# Patient Record
Sex: Female | Born: 1940 | Race: White | Hispanic: No | Smoking: Never smoker
Health system: Southern US, Community
[De-identification: ages and names within clinical notes are randomized; demographics above are authoritative.]

## PROBLEM LIST (undated history)

## (undated) DIAGNOSIS — R197 Diarrhea, unspecified: Secondary | ICD-10-CM

## (undated) DIAGNOSIS — E785 Hyperlipidemia, unspecified: Secondary | ICD-10-CM

## (undated) DIAGNOSIS — B009 Herpesviral infection, unspecified: Secondary | ICD-10-CM

## (undated) DIAGNOSIS — F419 Anxiety disorder, unspecified: Secondary | ICD-10-CM

## (undated) DIAGNOSIS — F32A Depression, unspecified: Secondary | ICD-10-CM

## (undated) DIAGNOSIS — L600 Ingrowing nail: Secondary | ICD-10-CM

## (undated) DIAGNOSIS — B0052 Herpesviral keratitis: Secondary | ICD-10-CM

## (undated) DIAGNOSIS — A6009 Herpesviral infection of other urogenital tract: Secondary | ICD-10-CM

## (undated) DIAGNOSIS — T7840XA Allergy, unspecified, initial encounter: Secondary | ICD-10-CM

## (undated) DIAGNOSIS — F329 Major depressive disorder, single episode, unspecified: Secondary | ICD-10-CM

## (undated) DIAGNOSIS — F319 Bipolar disorder, unspecified: Secondary | ICD-10-CM

## (undated) DIAGNOSIS — T50902A Poisoning by unspecified drugs, medicaments and biological substances, intentional self-harm, initial encounter: Secondary | ICD-10-CM

## (undated) DIAGNOSIS — R630 Anorexia: Secondary | ICD-10-CM

## (undated) DIAGNOSIS — K589 Irritable bowel syndrome without diarrhea: Secondary | ICD-10-CM

## (undated) DIAGNOSIS — J349 Unspecified disorder of nose and nasal sinuses: Secondary | ICD-10-CM

## (undated) DIAGNOSIS — T424X1A Poisoning by benzodiazepines, accidental (unintentional), initial encounter: Secondary | ICD-10-CM

## (undated) HISTORY — DX: Major depressive disorder, single episode, unspecified: F32.9

## (undated) HISTORY — PX: CATARACT EXTRACTION: SUR2

## (undated) HISTORY — DX: Poisoning by benzodiazepines, accidental (unintentional), initial encounter: T42.4X1A

## (undated) HISTORY — DX: Allergy, unspecified, initial encounter: T78.40XA

## (undated) HISTORY — PX: OTHER SURGICAL HISTORY: SHX169

## (undated) HISTORY — PX: EYE SURGERY: SHX253

## (undated) HISTORY — DX: Depression, unspecified: F32.A

## (undated) HISTORY — DX: Herpesviral infection of other urogenital tract: A60.09

## (undated) HISTORY — DX: Hyperlipidemia, unspecified: E78.5

## (undated) HISTORY — PX: TUBAL LIGATION: SHX77

## (undated) HISTORY — DX: Bipolar disorder, unspecified: F31.9

---

## 1981-09-08 DIAGNOSIS — A6009 Herpesviral infection of other urogenital tract: Secondary | ICD-10-CM

## 1981-09-08 HISTORY — DX: Herpesviral infection of other urogenital tract: A60.09

## 1998-07-12 ENCOUNTER — Inpatient Hospital Stay (HOSPITAL_COMMUNITY): Admission: EM | Admit: 1998-07-12 | Discharge: 1998-07-16 | Payer: Self-pay | Admitting: Emergency Medicine

## 1999-05-02 ENCOUNTER — Other Ambulatory Visit: Admission: RE | Admit: 1999-05-02 | Discharge: 1999-05-02 | Payer: Self-pay | Admitting: Gynecology

## 2000-01-23 ENCOUNTER — Emergency Department (HOSPITAL_COMMUNITY): Admission: EM | Admit: 2000-01-23 | Discharge: 2000-01-23 | Payer: Self-pay | Admitting: Emergency Medicine

## 2000-01-23 ENCOUNTER — Encounter: Payer: Self-pay | Admitting: Emergency Medicine

## 2000-09-14 ENCOUNTER — Other Ambulatory Visit: Admission: RE | Admit: 2000-09-14 | Discharge: 2000-09-14 | Payer: Self-pay | Admitting: Obstetrics and Gynecology

## 2003-04-06 ENCOUNTER — Other Ambulatory Visit: Admission: RE | Admit: 2003-04-06 | Discharge: 2003-04-06 | Payer: Self-pay | Admitting: Obstetrics and Gynecology

## 2003-09-03 ENCOUNTER — Emergency Department (HOSPITAL_COMMUNITY): Admission: EM | Admit: 2003-09-03 | Discharge: 2003-09-03 | Payer: Self-pay | Admitting: Emergency Medicine

## 2003-11-29 ENCOUNTER — Emergency Department (HOSPITAL_COMMUNITY): Admission: AD | Admit: 2003-11-29 | Discharge: 2003-11-29 | Payer: Self-pay | Admitting: Family Medicine

## 2004-02-22 ENCOUNTER — Encounter: Admission: RE | Admit: 2004-02-22 | Discharge: 2004-02-22 | Payer: Self-pay | Admitting: Family Medicine

## 2004-03-13 ENCOUNTER — Encounter: Admission: RE | Admit: 2004-03-13 | Discharge: 2004-03-13 | Payer: Self-pay | Admitting: Sports Medicine

## 2004-04-04 ENCOUNTER — Emergency Department (HOSPITAL_COMMUNITY): Admission: EM | Admit: 2004-04-04 | Discharge: 2004-04-04 | Payer: Self-pay | Admitting: Family Medicine

## 2004-04-21 ENCOUNTER — Emergency Department (HOSPITAL_COMMUNITY): Admission: EM | Admit: 2004-04-21 | Discharge: 2004-04-21 | Payer: Self-pay | Admitting: Family Medicine

## 2004-05-16 ENCOUNTER — Ambulatory Visit: Payer: Self-pay | Admitting: Family Medicine

## 2004-06-11 ENCOUNTER — Ambulatory Visit: Payer: Self-pay | Admitting: Family Medicine

## 2004-08-01 ENCOUNTER — Emergency Department (HOSPITAL_COMMUNITY): Admission: EM | Admit: 2004-08-01 | Discharge: 2004-08-01 | Payer: Self-pay | Admitting: Family Medicine

## 2004-08-08 ENCOUNTER — Ambulatory Visit: Payer: Self-pay | Admitting: Family Medicine

## 2004-08-14 ENCOUNTER — Ambulatory Visit: Payer: Self-pay | Admitting: Family Medicine

## 2004-10-11 ENCOUNTER — Emergency Department (HOSPITAL_COMMUNITY): Admission: EM | Admit: 2004-10-11 | Discharge: 2004-10-11 | Payer: Self-pay | Admitting: Family Medicine

## 2004-11-07 ENCOUNTER — Ambulatory Visit: Payer: Self-pay | Admitting: Family Medicine

## 2004-12-13 ENCOUNTER — Ambulatory Visit: Payer: Self-pay | Admitting: Sports Medicine

## 2005-01-29 ENCOUNTER — Ambulatory Visit: Payer: Self-pay | Admitting: Family Medicine

## 2005-03-08 ENCOUNTER — Encounter (INDEPENDENT_AMBULATORY_CARE_PROVIDER_SITE_OTHER): Payer: Self-pay | Admitting: *Deleted

## 2005-03-08 LAB — CONVERTED CEMR LAB

## 2005-03-11 ENCOUNTER — Emergency Department (HOSPITAL_COMMUNITY): Admission: EM | Admit: 2005-03-11 | Discharge: 2005-03-11 | Payer: Self-pay | Admitting: Emergency Medicine

## 2005-03-21 ENCOUNTER — Encounter (INDEPENDENT_AMBULATORY_CARE_PROVIDER_SITE_OTHER): Payer: Self-pay | Admitting: *Deleted

## 2005-03-21 ENCOUNTER — Ambulatory Visit: Payer: Self-pay | Admitting: Family Medicine

## 2005-04-28 ENCOUNTER — Ambulatory Visit: Payer: Self-pay | Admitting: Family Medicine

## 2005-06-08 DIAGNOSIS — T424X1A Poisoning by benzodiazepines, accidental (unintentional), initial encounter: Secondary | ICD-10-CM

## 2005-06-08 HISTORY — DX: Poisoning by benzodiazepines, accidental (unintentional), initial encounter: T42.4X1A

## 2005-06-15 ENCOUNTER — Emergency Department (HOSPITAL_COMMUNITY): Admission: EM | Admit: 2005-06-15 | Discharge: 2005-06-15 | Payer: Self-pay | Admitting: Family Medicine

## 2005-06-30 ENCOUNTER — Ambulatory Visit: Payer: Self-pay | Admitting: Psychiatry

## 2005-06-30 ENCOUNTER — Inpatient Hospital Stay (HOSPITAL_COMMUNITY): Admission: EM | Admit: 2005-06-30 | Discharge: 2005-07-04 | Payer: Self-pay | Admitting: Psychiatry

## 2005-07-19 ENCOUNTER — Emergency Department (HOSPITAL_COMMUNITY): Admission: EM | Admit: 2005-07-19 | Discharge: 2005-07-19 | Payer: Self-pay | Admitting: Family Medicine

## 2005-08-05 ENCOUNTER — Ambulatory Visit (HOSPITAL_COMMUNITY): Payer: Self-pay | Admitting: Psychiatry

## 2005-08-23 ENCOUNTER — Emergency Department (HOSPITAL_COMMUNITY): Admission: EM | Admit: 2005-08-23 | Discharge: 2005-08-23 | Payer: Self-pay | Admitting: Family Medicine

## 2005-09-09 ENCOUNTER — Emergency Department (HOSPITAL_COMMUNITY): Admission: EM | Admit: 2005-09-09 | Discharge: 2005-09-09 | Payer: Self-pay | Admitting: Emergency Medicine

## 2005-11-04 ENCOUNTER — Ambulatory Visit: Payer: Self-pay | Admitting: Family Medicine

## 2005-11-08 ENCOUNTER — Emergency Department (HOSPITAL_COMMUNITY): Admission: AD | Admit: 2005-11-08 | Discharge: 2005-11-08 | Payer: Self-pay | Admitting: Family Medicine

## 2005-11-12 ENCOUNTER — Ambulatory Visit (HOSPITAL_COMMUNITY): Admission: RE | Admit: 2005-11-12 | Discharge: 2005-11-12 | Payer: Self-pay | Admitting: Family Medicine

## 2005-11-14 ENCOUNTER — Emergency Department (HOSPITAL_COMMUNITY): Admission: EM | Admit: 2005-11-14 | Discharge: 2005-11-14 | Payer: Self-pay | Admitting: Family Medicine

## 2005-11-24 ENCOUNTER — Ambulatory Visit (HOSPITAL_COMMUNITY): Admission: RE | Admit: 2005-11-24 | Discharge: 2005-11-24 | Payer: Self-pay | Admitting: Family Medicine

## 2005-11-28 ENCOUNTER — Ambulatory Visit (HOSPITAL_COMMUNITY): Payer: Self-pay | Admitting: Psychiatry

## 2005-12-02 ENCOUNTER — Ambulatory Visit (HOSPITAL_COMMUNITY): Admission: RE | Admit: 2005-12-02 | Discharge: 2005-12-02 | Payer: Self-pay | Admitting: Family Medicine

## 2005-12-04 ENCOUNTER — Ambulatory Visit: Payer: Self-pay | Admitting: Family Medicine

## 2005-12-08 ENCOUNTER — Encounter: Admission: RE | Admit: 2005-12-08 | Discharge: 2005-12-08 | Payer: Self-pay | Admitting: Family Medicine

## 2006-01-28 ENCOUNTER — Ambulatory Visit (HOSPITAL_COMMUNITY): Payer: Self-pay | Admitting: Psychiatry

## 2006-01-30 ENCOUNTER — Emergency Department (HOSPITAL_COMMUNITY): Admission: EM | Admit: 2006-01-30 | Discharge: 2006-01-30 | Payer: Self-pay | Admitting: Family Medicine

## 2006-03-26 ENCOUNTER — Emergency Department (HOSPITAL_COMMUNITY): Admission: EM | Admit: 2006-03-26 | Discharge: 2006-03-26 | Payer: Self-pay | Admitting: Emergency Medicine

## 2006-03-30 ENCOUNTER — Ambulatory Visit: Payer: Self-pay | Admitting: Family Medicine

## 2006-03-31 ENCOUNTER — Ambulatory Visit (HOSPITAL_COMMUNITY): Payer: Self-pay | Admitting: Psychiatry

## 2006-05-13 ENCOUNTER — Emergency Department (HOSPITAL_COMMUNITY): Admission: EM | Admit: 2006-05-13 | Discharge: 2006-05-13 | Payer: Self-pay | Admitting: Family Medicine

## 2006-07-16 ENCOUNTER — Ambulatory Visit (HOSPITAL_COMMUNITY): Payer: Self-pay | Admitting: Psychiatry

## 2006-08-13 ENCOUNTER — Emergency Department (HOSPITAL_COMMUNITY): Admission: EM | Admit: 2006-08-13 | Discharge: 2006-08-13 | Payer: Self-pay | Admitting: Family Medicine

## 2006-09-25 ENCOUNTER — Emergency Department (HOSPITAL_COMMUNITY): Admission: EM | Admit: 2006-09-25 | Discharge: 2006-09-25 | Payer: Self-pay | Admitting: Emergency Medicine

## 2006-10-13 ENCOUNTER — Ambulatory Visit (HOSPITAL_COMMUNITY): Payer: Self-pay | Admitting: Psychiatry

## 2006-11-05 DIAGNOSIS — F319 Bipolar disorder, unspecified: Secondary | ICD-10-CM | POA: Insufficient documentation

## 2006-11-05 DIAGNOSIS — E7800 Pure hypercholesterolemia, unspecified: Secondary | ICD-10-CM

## 2006-11-06 ENCOUNTER — Encounter (INDEPENDENT_AMBULATORY_CARE_PROVIDER_SITE_OTHER): Payer: Self-pay | Admitting: *Deleted

## 2006-11-12 ENCOUNTER — Emergency Department (HOSPITAL_COMMUNITY): Admission: EM | Admit: 2006-11-12 | Discharge: 2006-11-12 | Payer: Self-pay | Admitting: Emergency Medicine

## 2006-11-17 ENCOUNTER — Ambulatory Visit (HOSPITAL_COMMUNITY): Payer: Self-pay | Admitting: Psychiatry

## 2006-11-26 ENCOUNTER — Ambulatory Visit (HOSPITAL_COMMUNITY): Payer: Self-pay | Admitting: Psychiatry

## 2006-12-14 ENCOUNTER — Telehealth: Payer: Self-pay | Admitting: *Deleted

## 2006-12-15 ENCOUNTER — Encounter: Admission: RE | Admit: 2006-12-15 | Discharge: 2006-12-15 | Payer: Self-pay | Admitting: Sports Medicine

## 2006-12-15 ENCOUNTER — Ambulatory Visit: Payer: Self-pay | Admitting: Family Medicine

## 2006-12-22 ENCOUNTER — Telehealth: Payer: Self-pay | Admitting: *Deleted

## 2006-12-23 ENCOUNTER — Ambulatory Visit: Payer: Self-pay | Admitting: Family Medicine

## 2007-02-02 ENCOUNTER — Ambulatory Visit (HOSPITAL_COMMUNITY): Payer: Self-pay | Admitting: Psychiatry

## 2007-03-02 ENCOUNTER — Emergency Department (HOSPITAL_COMMUNITY): Admission: EM | Admit: 2007-03-02 | Discharge: 2007-03-02 | Payer: Self-pay | Admitting: Family Medicine

## 2007-03-18 ENCOUNTER — Encounter (INDEPENDENT_AMBULATORY_CARE_PROVIDER_SITE_OTHER): Payer: Self-pay | Admitting: Family Medicine

## 2007-04-26 ENCOUNTER — Encounter: Payer: Self-pay | Admitting: *Deleted

## 2007-04-26 LAB — CONVERTED CEMR LAB
Bilirubin Urine: NEGATIVE
Glucose, Urine, Semiquant: NEGATIVE
Ketones, urine, test strip: NEGATIVE
Nitrite: NEGATIVE
Protein, U semiquant: 30
Specific Gravity, Urine: 1.015
Urobilinogen, UA: 0.2
pH: 6.5

## 2007-04-27 ENCOUNTER — Encounter: Payer: Self-pay | Admitting: *Deleted

## 2007-05-11 ENCOUNTER — Telehealth (INDEPENDENT_AMBULATORY_CARE_PROVIDER_SITE_OTHER): Payer: Self-pay | Admitting: *Deleted

## 2007-05-11 ENCOUNTER — Ambulatory Visit: Payer: Self-pay | Admitting: Family Medicine

## 2007-05-12 ENCOUNTER — Ambulatory Visit (HOSPITAL_COMMUNITY): Payer: Self-pay | Admitting: Psychiatry

## 2007-05-19 ENCOUNTER — Ambulatory Visit: Payer: Self-pay | Admitting: Family Medicine

## 2007-08-12 ENCOUNTER — Telehealth: Payer: Self-pay | Admitting: *Deleted

## 2007-08-12 ENCOUNTER — Ambulatory Visit: Payer: Self-pay | Admitting: Family Medicine

## 2007-08-28 ENCOUNTER — Emergency Department (HOSPITAL_COMMUNITY): Admission: EM | Admit: 2007-08-28 | Discharge: 2007-08-28 | Payer: Self-pay | Admitting: Family Medicine

## 2007-08-30 ENCOUNTER — Telehealth: Payer: Self-pay | Admitting: *Deleted

## 2007-08-30 ENCOUNTER — Emergency Department (HOSPITAL_COMMUNITY): Admission: EM | Admit: 2007-08-30 | Discharge: 2007-08-30 | Payer: Self-pay | Admitting: *Deleted

## 2007-09-27 ENCOUNTER — Telehealth: Payer: Self-pay | Admitting: *Deleted

## 2007-10-09 ENCOUNTER — Emergency Department (HOSPITAL_COMMUNITY): Admission: EM | Admit: 2007-10-09 | Discharge: 2007-10-10 | Payer: Self-pay | Admitting: Emergency Medicine

## 2008-02-03 ENCOUNTER — Emergency Department (HOSPITAL_COMMUNITY): Admission: EM | Admit: 2008-02-03 | Discharge: 2008-02-03 | Payer: Self-pay | Admitting: Family Medicine

## 2008-03-08 ENCOUNTER — Encounter (INDEPENDENT_AMBULATORY_CARE_PROVIDER_SITE_OTHER): Payer: Self-pay | Admitting: Family Medicine

## 2008-04-17 ENCOUNTER — Telehealth: Payer: Self-pay | Admitting: *Deleted

## 2008-04-26 ENCOUNTER — Emergency Department (HOSPITAL_COMMUNITY): Admission: EM | Admit: 2008-04-26 | Discharge: 2008-04-26 | Payer: Self-pay | Admitting: Family Medicine

## 2008-05-16 ENCOUNTER — Telehealth: Payer: Self-pay | Admitting: *Deleted

## 2008-05-17 ENCOUNTER — Ambulatory Visit: Payer: Self-pay | Admitting: Family Medicine

## 2008-05-17 DIAGNOSIS — I10 Essential (primary) hypertension: Secondary | ICD-10-CM

## 2008-05-17 DIAGNOSIS — F329 Major depressive disorder, single episode, unspecified: Secondary | ICD-10-CM

## 2008-06-05 ENCOUNTER — Ambulatory Visit: Payer: Self-pay | Admitting: Family Medicine

## 2008-06-21 ENCOUNTER — Encounter (INDEPENDENT_AMBULATORY_CARE_PROVIDER_SITE_OTHER): Payer: Self-pay | Admitting: Family Medicine

## 2008-06-21 ENCOUNTER — Ambulatory Visit: Payer: Self-pay | Admitting: Family Medicine

## 2008-06-26 LAB — CONVERTED CEMR LAB
ALT: 18 units/L (ref 0–35)
AST: 17 units/L (ref 0–37)
Albumin: 4.6 g/dL (ref 3.5–5.2)
Alkaline Phosphatase: 69 units/L (ref 39–117)
BUN: 18 mg/dL (ref 6–23)
Basophils Absolute: 0.1 10*3/uL (ref 0.0–0.1)
Basophils Relative: 1 % (ref 0–1)
CO2: 25 meq/L (ref 19–32)
Calcium: 9.5 mg/dL (ref 8.4–10.5)
Chloride: 99 meq/L (ref 96–112)
Cholesterol: 245 mg/dL — ABNORMAL HIGH (ref 0–200)
Creatinine, Ser: 0.94 mg/dL (ref 0.40–1.20)
Eosinophils Absolute: 0.1 10*3/uL (ref 0.0–0.7)
Eosinophils Relative: 2 % (ref 0–5)
Glucose, Bld: 111 mg/dL — ABNORMAL HIGH (ref 70–99)
HCT: 47.7 % — ABNORMAL HIGH (ref 36.0–46.0)
HDL: 41 mg/dL (ref >39)
Hemoglobin: 15.9 g/dL — ABNORMAL HIGH (ref 12.0–15.0)
LDL Cholesterol: 144 mg/dL — ABNORMAL HIGH (ref 0–99)
Lymphocytes Relative: 40 % (ref 12–46)
Lymphs Abs: 2.8 10*3/uL (ref 0.7–4.0)
MCHC: 33.3 g/dL (ref 30.0–36.0)
MCV: 98.6 fL (ref 78.0–100.0)
Monocytes Absolute: 0.6 10*3/uL (ref 0.1–1.0)
Monocytes Relative: 8 % (ref 3–12)
Neutro Abs: 3.6 10*3/uL (ref 1.7–7.7)
Neutrophils Relative %: 50 % (ref 43–77)
Platelets: 365 10*3/uL (ref 150–400)
Potassium: 4.4 meq/L (ref 3.5–5.3)
RBC: 4.84 M/uL (ref 3.87–5.11)
RDW: 14.1 % (ref 11.5–15.5)
Sodium: 138 meq/L (ref 135–145)
TSH: 3.037 microintl units/mL (ref 0.350–4.50)
Total Bilirubin: 0.5 mg/dL (ref 0.3–1.2)
Total CHOL/HDL Ratio: 6
Total Protein: 7.5 g/dL (ref 6.0–8.3)
Triglycerides: 301 mg/dL — ABNORMAL HIGH (ref <150)
VLDL: 60 mg/dL — ABNORMAL HIGH (ref 0–40)
Vitamin B-12: 514 pg/mL (ref 211–911)
WBC: 7.1 10*3/uL (ref 4.0–10.5)

## 2008-07-07 ENCOUNTER — Encounter (INDEPENDENT_AMBULATORY_CARE_PROVIDER_SITE_OTHER): Payer: Self-pay | Admitting: Family Medicine

## 2008-07-07 ENCOUNTER — Ambulatory Visit: Payer: Self-pay | Admitting: Family Medicine

## 2008-08-26 ENCOUNTER — Emergency Department (HOSPITAL_COMMUNITY): Admission: EM | Admit: 2008-08-26 | Discharge: 2008-08-26 | Payer: Self-pay | Admitting: Emergency Medicine

## 2008-08-28 ENCOUNTER — Telehealth: Payer: Self-pay | Admitting: *Deleted

## 2008-09-03 ENCOUNTER — Emergency Department (HOSPITAL_COMMUNITY): Admission: EM | Admit: 2008-09-03 | Discharge: 2008-09-03 | Payer: Self-pay | Admitting: Family Medicine

## 2008-09-21 ENCOUNTER — Encounter: Payer: Self-pay | Admitting: Family Medicine

## 2008-09-21 ENCOUNTER — Telehealth: Payer: Self-pay | Admitting: *Deleted

## 2008-09-21 ENCOUNTER — Ambulatory Visit: Payer: Self-pay | Admitting: Family Medicine

## 2008-09-21 LAB — CONVERTED CEMR LAB
BUN: 18 mg/dL (ref 6–23)
CO2: 22 meq/L (ref 19–32)
Calcium: 9 mg/dL (ref 8.4–10.5)
Chloride: 105 meq/L (ref 96–112)
Creatinine, Ser: 0.77 mg/dL (ref 0.40–1.20)
Glucose, Bld: 88 mg/dL (ref 70–99)
Potassium: 4.5 meq/L (ref 3.5–5.3)
Sed Rate: 2 mm/hr (ref 0–22)
Sodium: 141 meq/L (ref 135–145)

## 2008-09-28 ENCOUNTER — Encounter: Payer: Self-pay | Admitting: Family Medicine

## 2008-10-16 ENCOUNTER — Telehealth (INDEPENDENT_AMBULATORY_CARE_PROVIDER_SITE_OTHER): Payer: Self-pay | Admitting: Family Medicine

## 2008-10-18 ENCOUNTER — Ambulatory Visit: Payer: Self-pay | Admitting: Family Medicine

## 2008-10-25 ENCOUNTER — Encounter (INDEPENDENT_AMBULATORY_CARE_PROVIDER_SITE_OTHER): Payer: Self-pay | Admitting: Family Medicine

## 2008-10-25 ENCOUNTER — Ambulatory Visit: Payer: Self-pay | Admitting: Family Medicine

## 2008-10-26 LAB — CONVERTED CEMR LAB
ALT: 30 units/L (ref 0–35)
AST: 23 units/L (ref 0–37)
Albumin: 4.2 g/dL (ref 3.5–5.2)
Alkaline Phosphatase: 66 units/L (ref 39–117)
BUN: 21 mg/dL (ref 6–23)
CO2: 25 meq/L (ref 19–32)
Calcium: 9 mg/dL (ref 8.4–10.5)
Chloride: 103 meq/L (ref 96–112)
Creatinine, Ser: 0.73 mg/dL (ref 0.40–1.20)
Direct LDL: 133 mg/dL — ABNORMAL HIGH
Glucose, Bld: 117 mg/dL — ABNORMAL HIGH (ref 70–99)
Potassium: 4.3 meq/L (ref 3.5–5.3)
Sodium: 139 meq/L (ref 135–145)
Total Bilirubin: 0.3 mg/dL (ref 0.3–1.2)
Total Protein: 6.9 g/dL (ref 6.0–8.3)

## 2008-11-25 ENCOUNTER — Emergency Department (HOSPITAL_COMMUNITY): Admission: EM | Admit: 2008-11-25 | Discharge: 2008-11-26 | Payer: Self-pay | Admitting: Emergency Medicine

## 2008-12-01 ENCOUNTER — Ambulatory Visit: Payer: Self-pay | Admitting: Family Medicine

## 2008-12-15 ENCOUNTER — Ambulatory Visit: Payer: Self-pay | Admitting: Family Medicine

## 2008-12-15 DIAGNOSIS — R7303 Prediabetes: Secondary | ICD-10-CM | POA: Insufficient documentation

## 2008-12-15 LAB — CONVERTED CEMR LAB: Hgb A1c MFr Bld: 6.2 %

## 2008-12-20 ENCOUNTER — Encounter (INDEPENDENT_AMBULATORY_CARE_PROVIDER_SITE_OTHER): Payer: Self-pay | Admitting: Family Medicine

## 2008-12-20 ENCOUNTER — Telehealth: Payer: Self-pay | Admitting: *Deleted

## 2009-01-19 ENCOUNTER — Emergency Department (HOSPITAL_COMMUNITY): Admission: EM | Admit: 2009-01-19 | Discharge: 2009-01-20 | Payer: Self-pay | Admitting: Emergency Medicine

## 2009-01-26 ENCOUNTER — Ambulatory Visit: Payer: Self-pay | Admitting: Family Medicine

## 2009-01-26 DIAGNOSIS — G4700 Insomnia, unspecified: Secondary | ICD-10-CM | POA: Insufficient documentation

## 2009-01-30 ENCOUNTER — Telehealth (INDEPENDENT_AMBULATORY_CARE_PROVIDER_SITE_OTHER): Payer: Self-pay | Admitting: Family Medicine

## 2009-01-31 ENCOUNTER — Ambulatory Visit (HOSPITAL_COMMUNITY): Admission: RE | Admit: 2009-01-31 | Discharge: 2009-01-31 | Payer: Self-pay | Admitting: Family Medicine

## 2009-01-31 ENCOUNTER — Ambulatory Visit: Payer: Self-pay | Admitting: Family Medicine

## 2009-02-02 ENCOUNTER — Encounter (INDEPENDENT_AMBULATORY_CARE_PROVIDER_SITE_OTHER): Payer: Self-pay | Admitting: Family Medicine

## 2009-02-19 ENCOUNTER — Encounter (INDEPENDENT_AMBULATORY_CARE_PROVIDER_SITE_OTHER): Payer: Self-pay | Admitting: Family Medicine

## 2009-06-06 ENCOUNTER — Ambulatory Visit: Payer: Self-pay | Admitting: Family Medicine

## 2009-06-06 ENCOUNTER — Encounter: Payer: Self-pay | Admitting: Family Medicine

## 2009-06-06 LAB — CONVERTED CEMR LAB: Hgb A1c MFr Bld: 6.3 %

## 2009-06-08 ENCOUNTER — Telehealth: Payer: Self-pay | Admitting: Family Medicine

## 2009-06-11 ENCOUNTER — Encounter: Payer: Self-pay | Admitting: Family Medicine

## 2009-06-25 ENCOUNTER — Telehealth: Payer: Self-pay | Admitting: Family Medicine

## 2009-06-25 ENCOUNTER — Ambulatory Visit: Payer: Self-pay | Admitting: Family Medicine

## 2009-07-09 ENCOUNTER — Ambulatory Visit: Payer: Self-pay | Admitting: Family Medicine

## 2009-07-26 ENCOUNTER — Encounter: Payer: Self-pay | Admitting: Family Medicine

## 2009-08-10 ENCOUNTER — Telehealth: Payer: Self-pay | Admitting: Family Medicine

## 2009-09-11 ENCOUNTER — Telehealth: Payer: Self-pay | Admitting: Family Medicine

## 2009-09-21 ENCOUNTER — Ambulatory Visit: Payer: Self-pay | Admitting: Family Medicine

## 2009-09-21 ENCOUNTER — Encounter: Payer: Self-pay | Admitting: Family Medicine

## 2009-09-21 LAB — CONVERTED CEMR LAB
ALT: 26 units/L (ref 0–35)
AST: 20 units/L (ref 0–37)
Albumin: 4.6 g/dL (ref 3.5–5.2)
Alkaline Phosphatase: 59 units/L (ref 39–117)
BUN: 22 mg/dL (ref 6–23)
Basophils Absolute: 0.1 10*3/uL (ref 0.0–0.1)
Basophils Relative: 1 % (ref 0–1)
CO2: 21 meq/L (ref 19–32)
CRP: 0.1 mg/dL (ref <0.6)
Calcium: 8.9 mg/dL (ref 8.4–10.5)
Chloride: 100 meq/L (ref 96–112)
Creatinine, Ser: 0.75 mg/dL (ref 0.40–1.20)
Eosinophils Absolute: 0.1 10*3/uL (ref 0.0–0.7)
Eosinophils Relative: 1 % (ref 0–5)
Glucose, Bld: 97 mg/dL (ref 70–99)
HCT: 45.1 % (ref 36.0–46.0)
Hemoglobin: 14.9 g/dL (ref 12.0–15.0)
Lymphocytes Relative: 28 % (ref 12–46)
Lymphs Abs: 2.6 10*3/uL (ref 0.7–4.0)
MCHC: 33 g/dL (ref 30.0–36.0)
MCV: 100.7 fL — ABNORMAL HIGH (ref 78.0–100.0)
Monocytes Absolute: 0.7 10*3/uL (ref 0.1–1.0)
Monocytes Relative: 7 % (ref 3–12)
Neutro Abs: 5.7 10*3/uL (ref 1.7–7.7)
Neutrophils Relative %: 63 % (ref 43–77)
Platelets: 372 10*3/uL (ref 150–400)
Potassium: 4.6 meq/L (ref 3.5–5.3)
RBC: 4.48 M/uL (ref 3.87–5.11)
RDW: 13 % (ref 11.5–15.5)
Sodium: 139 meq/L (ref 135–145)
Total Bilirubin: 0.4 mg/dL (ref 0.3–1.2)
Total Protein: 7.3 g/dL (ref 6.0–8.3)
WBC: 9.1 10*3/uL (ref 4.0–10.5)

## 2009-09-28 ENCOUNTER — Ambulatory Visit: Payer: Self-pay | Admitting: Family Medicine

## 2009-10-29 ENCOUNTER — Telehealth: Payer: Self-pay | Admitting: Family Medicine

## 2009-10-29 ENCOUNTER — Ambulatory Visit: Payer: Self-pay | Admitting: Family Medicine

## 2010-01-10 ENCOUNTER — Ambulatory Visit: Payer: Self-pay | Admitting: Family Medicine

## 2010-01-10 ENCOUNTER — Encounter: Payer: Self-pay | Admitting: Family Medicine

## 2010-01-20 ENCOUNTER — Emergency Department (HOSPITAL_COMMUNITY): Admission: EM | Admit: 2010-01-20 | Discharge: 2010-01-20 | Payer: Self-pay | Admitting: Emergency Medicine

## 2010-01-31 ENCOUNTER — Ambulatory Visit: Payer: Self-pay | Admitting: Family Medicine

## 2010-01-31 ENCOUNTER — Telehealth: Payer: Self-pay | Admitting: Family Medicine

## 2010-02-13 ENCOUNTER — Encounter: Payer: Self-pay | Admitting: Family Medicine

## 2010-02-13 ENCOUNTER — Telehealth: Payer: Self-pay | Admitting: Family Medicine

## 2010-02-14 ENCOUNTER — Ambulatory Visit: Payer: Self-pay | Admitting: Family Medicine

## 2010-05-31 ENCOUNTER — Ambulatory Visit: Payer: Self-pay | Admitting: Family Medicine

## 2010-07-05 ENCOUNTER — Emergency Department (HOSPITAL_COMMUNITY): Admission: EM | Admit: 2010-07-05 | Discharge: 2010-07-05 | Payer: Self-pay | Admitting: Family Medicine

## 2010-08-05 ENCOUNTER — Telehealth (INDEPENDENT_AMBULATORY_CARE_PROVIDER_SITE_OTHER): Payer: Self-pay | Admitting: *Deleted

## 2010-08-05 ENCOUNTER — Ambulatory Visit: Payer: Self-pay | Admitting: Family Medicine

## 2010-08-05 DIAGNOSIS — A6000 Herpesviral infection of urogenital system, unspecified: Secondary | ICD-10-CM | POA: Insufficient documentation

## 2010-09-13 ENCOUNTER — Ambulatory Visit: Admit: 2010-09-13 | Payer: Self-pay

## 2010-09-29 ENCOUNTER — Encounter: Payer: Self-pay | Admitting: Family Medicine

## 2010-10-10 NOTE — Assessment & Plan Note (Signed)
Summary: ear pain & head cold per pt/[place]/Ta   Vital Signs:  Patient profile:   70 year old female Height:      61 inches Weight:      167 pounds BMI:     31.67 BSA:     1.75 Temp:     98.4 degrees F Pulse rate:   90 / minute BP sitting:   157 / 81  Vitals Entered By: Jone Baseman CMA (October 29, 2009 11:01 AM) CC: bilateral ear pain and head cold Is Patient Diabetic? Yes Did you bring your meter with you today? No Pain Assessment Patient in pain? yes     Location: throat/head Intensity: 7   Primary Care Provider:  Cat Ta MD  CC:  bilateral ear pain and head cold.  History of Present Illness: 70 yo female with 8 days of pharyngitis, headaehce, runny nose and eyes, bialteral otalgia that started on the LEFT.  No fever, dypsnea.  Not taking any medicine for relief.  Habits & Providers  Alcohol-Tobacco-Diet     Tobacco Status: never  Allergies (verified): 1)  ! Benadryl (Diphenhydramine Hcl)  Physical Exam  Additional Exam:  VITALS:  Reviewed, afebrile, hypertensive GEN: Alert & oriented, no acute distress NECK: Midline trachea, no masses/thyromegaly, anterior nontender bilateral LAD CARDIO: Regular rate and rhythm, no murmurs/rubs/gallops, 2+ bilateral radial pulses RESP: Clear to auscultation, normal work of breathing, no retractions/accessory muscle use EYES:  No corneal or conjunctival inflammation noted. EOMI. PERRLA.  Vision grossly normal. EARS:  LEFT TM red and retracted.  RIGHT wnl. NOSE:  Nasal mucosa are pink and moist without lesions or exudates. MOUTH:  Oral mucosa and oropharynx without lesions or exudates.    Impression & Recommendations:  Problem # 1:  VIRAL URI (ICD-465.9) Assessment New  Supportive care.  Orders: [facility]- Est Level  3 ([address])  Problem # 2:  OTITIS MEDIA, ACUTE, LEFT (ICD-382.9) Assessment: New  This is secondary to #1, above.  Given pharyngitis symptoms will treat with longer 10-day course of Amoxicillin to cover GAS  pharyngitis as well, though low clinical suspicion given afebrile, no tender LAD.  Orders: [facility]- Est Level  3 ([address])  Complete Medication List: 1)  Hydrochlorothiazide 25 Mg Tabs (Hydrochlorothiazide) .... Take 1 tab by mouth every morning 2)  Acyclovir 800 Mg Tabs (Acyclovir) .Marland Kitchen.. 1 by mouth three times a day 3)  Lisinopril 10 Mg Tabs (Lisinopril) .Marland Kitchen.. 1 by mouth daily 4)  Alprazolam 0.5 Mg Tabs (Alprazolam) .Marland Kitchen.. 1-2 by mouth every 8 hours as needed for anxiety 5)  Perphenazine 4 Mg Tabs (Perphenazine) .Marland Kitchen.. 1 by mouth at bedtime 6)  Temazepam 30 Mg Caps (Temazepam) .Marland Kitchen.. 1-2 tab by mouth at bedtime 7)  Effexor Xr 75 Mg Xr24h-cap (Venlafaxine hcl) .... 3 tab by mouth daily. prescribed by [facility] 8)  Depakote 500 Mg Tbec (Divalproex sodium) .Marland Kitchen.. 1 tab by mouth daily per [facility] 9)  Doxycycline Hyclate 50 Mg Caps (Doxycycline hyclate) .Marland Kitchen.. 1 tab by mouth two times a day per dr hall jr 10)  Tramadol Hcl 50 Mg Tabs (Tramadol hcl) .... Take 1-2 pills by mouth every 6 hours as needed for pain 11)  Amoxicillin 500 Mg Tabs (Amoxicillin) .Marland Kitchen.. 1 tab by mouth three times a day x10 days  Patient Instructions: 1)  Pleasure to meet you today. 2)  I have sent prescription for Amoxicillin to [facility]. 3)  Please schedule a follow-up appointment if not better in 1 week, or if you develop fever or  shortness of breath. Prescriptions: AMOXICILLIN 500 MG TABS (AMOXICILLIN) 1 tab by mouth three times a day x10 days  #30 x 0   Entered and Authorized by:   Romero Belling MD   Signed by:   Romero Belling MD on 10/29/2009   Method used:   Electronically to        [facility] [address] [id number]* (retail)       [address] [place], [place]  [address]       Ph: [phone]       Fax: [phone]   RxID:   [id number]

## 2010-10-10 NOTE — Assessment & Plan Note (Signed)
Summary: isomnia, htn ,tcb   Vital Signs:  Patient profile:   70 year old female Height:      61 inches Weight:      167.8 pounds BMI:     31.82 Pulse rate:   81 / minute BP sitting:   194 / 106  (left arm) Cuff size:   regular  Vitals Entered By: Arlyss Repress CMA, (May 31, 2010 2:52 PM)  Serial Vital Signs/Assessments:  Time      Position  BP       Pulse  Resp  Temp     By 3:54 PM             158/98                         Arlyss Repress CMA,  Comments: 3:54 PM re-checked BP, after pt was more relaxed. By: Arlyss Repress CMA,   CC: extreme insomnia x 5 weeks. refill meds. out of BP meds x 1 month. pt states 'I am having a burning sensation all over my body from fatigue' Is Patient Diabetic? No Pain Assessment Patient in pain? no        Primary Care Provider:  Hilarie Sinha MD  CC:  extreme insomnia x 5 weeks. refill meds. out of BP meds x 1 month. pt states 'I am having a burning sensation all over my body from fatigue'.  History of Present Illness: 70 y/o F here for insomnia  Insomnia x 5 wks:  insomnia all her life.  worsened 5 wks ago.  takes xanax, depokote, resteril.  Being in out Xanax for a long time.  depakote prescribed by [facility].  Difficulty falling asleep.   Has difficulty falling asleep.  1 month ago her next door neighbor was murdered.  Pt saw the blood  in the house when people were cleaning the house out.  Ever since then she cannot sleep.  She thinks about this a lot.  She has not discussed this with [facility], because she thinks that they are "hateful".  She sees a CNP at [facility] for bipolar meds, but states that they do not give her any counseling.   Thoughts of SI, but only fleeting, for seconds.  She has has these thoughts for most of her life.  History of father commiting suicide.    HYPERTENSION Disease Monitoring Blood pressure range: 150s Medications: lisinopril 10, hctz 25 Compliance: no, out of HCTZ, then stopped taking lisinopril on  her own Lightheadedness: no  Edema:no   Chest pain:no   Dyspnea:no      Habits & Providers  Alcohol-Tobacco-Diet     Tobacco Status: never  Current Medications (verified): 1)  Hydrochlorothiazide 25 Mg  Tabs (Hydrochlorothiazide) .... Take 1 Tab By Mouth Every Morning 2)  Acyclovir 800 Mg  Tabs (Acyclovir) .Marland Kitchen.. 1 By Mouth Three Times A Day 3)  Lisinopril 10 Mg Tabs (Lisinopril) .Marland Kitchen.. 1 By Mouth Daily 4)  Alprazolam 0.5 Mg Tabs (Alprazolam) .Marland Kitchen.. 1 Tab By Mouth At Bedtime As Needed Insomnia/anxiety 5)  Perphenazine 4 Mg Tabs (Perphenazine) .Marland Kitchen.. 1 By Mouth At Bedtime 6)  Temazepam 30 Mg Caps (Temazepam) .Marland Kitchen.. 1-2 Tab By Mouth At Bedtime 7)  Effexor Xr 75 Mg Xr24h-Cap (Venlafaxine Hcl) .Marland Kitchen.. 1 Tab By Mouth Daily For 4 Days, Then 2 Tabs For 4 Days, Then 3 Tabs Daily 8)  Depakote 500 Mg Tbec (Divalproex Sodium) .Marland Kitchen.. 1 Tab By Mouth Daily Per [facility] 9)  Doxycycline Hyclate 50 Mg Caps (Doxycycline Hyclate) .Marland Kitchen.. 1 Tab By Mouth Two Times A Day Per Dr Cassie Freer  Allergies (verified): 1)  ! Benadryl (Diphenhydramine Hcl)  Past History:  Past Medical History: Last updated: 02/19/2009 Xanax OD in 10/06 Current Problems:  DIZZINESS (ICD-780.4) INSOMNIA (ICD-780.52) DIABETES MELLITUS, TYPE II (ICD-250.00) CATARACTS (ICD-366.9) GENITAL HERPES, HX OF (ICD-V13.8) ESSENTIAL HYPERTENSION, BENIGN (ICD-401.1) ABDOMINAL PAIN, CHRONIC (ICD-789.00) DEPRESSION, MAJOR (ICD-296.20) RHINITIS, ALLERGIC (ICD-477.9) HYPERCHOLESTEROLEMIA (ICD-272.0) BIPOLAR DISORDER (ICD-296.7)  Past Surgical History: Last updated: 07/09/2009 Cataract surgery: L eye 12/2008, R eye 05/2009 by Dr Hazle Quant eye surgery as a child Tubal ligation - 09/08/1969  Family History: Last updated: 05/17/2008 parents both with psychiatric illnesses.  Mother had breast cancer.  Social History: Last updated: 02/19/2009 Occassional EtOH.  Never smoked.  Denies drugs.  OD's on xanax in 2006.  Has 2 cats.  Is an Tree surgeon.  Meredeth Ide and stretches  for exercises.  Single - divorced 12 years ago. severe psychiatric impairments  Risk Factors: Alcohol Use: 0 (07/09/2009)  Risk Factors: Smoking Status: never (05/31/2010)  Review of Systems       per [facility]:  Complains of anxiety, depression, easily angered, easily tearful, irritability, and suicidal thoughts/plans; denies sense of great danger, thoughts of violence, and thoughts /plans of harming others.  Physical Exam  General:  Well-developed,well-nourished,in no acute distress; alert,appropriate and cooperative throughout examination. vitals reviewed.  Lungs:  Normal respiratory effort, chest expands symmetrically. Lungs are clear to auscultation, no crackles or wheezes. Heart:  Normal rate and regular rhythm. S1 and S2 normal without gallop, murmur, click, rub or other extra sounds. Psych:  Oriented X3, not suicidal, not homicidal, depressed affect, tearful, and moderately anxious.     Impression & Recommendations:  Problem # 1:  INSOMNIA (ICD-780.52) Assessment Deteriorated Long standing insomnia but deterioration in past weeks likely from acute anxiety in a pt who already has bipolar do, depression, anxiety.  Will refill Xanax for at bedtime use.  Pt to make appt with psych (we provided list of provider).  She is to f/u with me in 3-4 wks.   Orders: [facility]- Est Level  3 ([address])  Problem # 2:  ESSENTIAL HYPERTENSION, BENIGN (ICD-401.1) Assessment: Deteriorated Out of meds x wks.  Will resume.  Pt ot f/u in 3-4 wks Her updated medication list for this problem includes:    Hydrochlorothiazide 25 Mg Tabs (Hydrochlorothiazide) .Marland Kitchen... Take 1 tab by mouth every morning    Lisinopril 10 Mg Tabs (Lisinopril) .Marland Kitchen... 1 by mouth daily  Orders: [facility]- Est Level  3 ([id number])  Complete Medication List: 1)  Hydrochlorothiazide 25 Mg Tabs (Hydrochlorothiazide) .... Take 1 tab by mouth every morning 2)  Acyclovir 800 Mg Tabs (Acyclovir) .Marland Kitchen.. 1 by mouth three times a day 3)  Lisinopril 10  Mg Tabs (Lisinopril) .Marland Kitchen.. 1 by mouth daily 4)  Alprazolam 0.5 Mg Tabs (Alprazolam) .Marland Kitchen.. 1 tab by mouth at bedtime as needed insomnia/anxiety 5)  Perphenazine 4 Mg Tabs (Perphenazine) .Marland Kitchen.. 1 by mouth at bedtime 6)  Temazepam 30 Mg Caps (Temazepam) .Marland Kitchen.. 1-2 tab by mouth at bedtime 7)  Effexor Xr 75 Mg Xr24h-cap (Venlafaxine hcl) .Marland Kitchen.. 1 tab by mouth daily for 4 days, then 2 tabs for 4 days, then 3 tabs daily 8)  Depakote 500 Mg Tbec (Divalproex sodium) .Marland Kitchen.. 1 tab by mouth daily per [facility] 9)  Doxycycline Hyclate 50 Mg Caps (Doxycycline hyclate) .Marland Kitchen.. 1 tab by mouth two times a day per dr hall jr  Patient  Instructions: 1)  Please schedule a follow-up appointment in 3-4weeks for insomnia. 2)  You can take Xanax 0.5mg  before bed as needed for anxiety. 3)  Please make an appointment to see a psychiatrist at [facility] or another facility as soon as possible.  I think you would benefit from counseling to help you with this acute event.   Prescriptions: ALPRAZOLAM 0.5 MG TABS (ALPRAZOLAM) 1 tab by mouth at bedtime as needed insomnia/anxiety  #30 x 0   Entered and Authorized by:   Angeline Slim MD   Signed by:   Angeline Slim MD on 06/02/2010   Method used:   Telephoned to ...       [facility] [address] [id number]* (retail)       [address] [place], [place]  [address]       Ph: [phone]       Fax: [phone]   RxID:   [id number] ALPRAZOLAM 0.5 MG TABS (ALPRAZOLAM) 1-2 by mouth every 8 hours as needed for anxiety  #30 x 0   Entered and Authorized by:   Angeline Slim MD   Signed by:   Angeline Slim MD on 05/31/2010   Method used:   Telephoned to ...       [facility] [address] #[id number]* (retail)       [address] [place], [place]  [address]       Ph: [phone]       Fax: [phone]   RxID:   [id number] LISINOPRIL 10 MG TABS (LISINOPRIL) 1 by mouth daily  #34 x 5   Entered and Authorized by:   Angeline Slim MD   Signed by:   Angeline Slim MD on 05/31/2010   Method used:   Electronically to         [facility] [id number]* (retail)       [address] [place], [place]  [address]       Ph: [phone]       Fax: [phone]   RxID:   [id number] HYDROCHLOROTHIAZIDE 25 MG  TABS (HYDROCHLOROTHIAZIDE) Take 1 tab by mouth every morning  #34 x 5   Entered and Authorized by:   Angeline Slim MD   Signed by:   Angeline Slim MD on 05/31/2010   Method used:   Electronically to        [facility] #[id number]* (retail)       [address] [place], [place]  [address]       Ph: [phone]       Fax: [phone]   RxID:   [id number]

## 2010-10-10 NOTE — Miscellaneous (Signed)
Summary: Updated PT order  Clinical Lists Changes  Problems: Added new problem of BACK PAIN, LUMBAR (ICD-724.2) Orders: Added new Referral order of Physical Therapy Referral (PT) - Signed    Physical Therapy called and patient is coming for her first evaluation today from referral that was sent in on 01/10/2010. That referral is out of date and they need a new one.  new referral entered and  faxed. Theresia Lo RN  February 13, 2010 11:15 AM

## 2010-10-10 NOTE — Assessment & Plan Note (Signed)
Summary: needs meds,df   Vital Signs:  Patient profile:   70 year old female Weight:      172.9 pounds BMI:     32.79 Temp:     98.6 degrees F Pulse rate:   76 / minute BP sitting:   150 / 89  Vitals Entered By: Starleen Blue RN (Jan 31, 2010 9:14 AM)  Serial Vital Signs/Assessments:  Time      Position  BP       Pulse  Resp  Temp     By                     158/92                         Ardeen Garland  MD  CC: meds Is Patient Diabetic? Yes Pain Assessment Patient in pain? no        Primary Care Provider:  Cat Ta MD  CC:  meds.  History of Present Illness: Janice Ramos comes in today for BP meds and to discuss depression meds. 1) BP - on HCTZ 25 and lisinopril 10.  Ran out of lisinopril 2 nights ago and forgot to take her HCTZ today.  Tolerates them well.  CHecks her BP often when on them and states top number never higher than "128".  Last BMET normal in 1/11. No CP, HA, SOB, dizziness, orthostasis 2) Depression meds/care - has several issues outlined below:       - Used to see Dr. Geoffery Lyons.  Apparently stopped seeing private pts some time ago and so she started care with [facility].  Lately very unhappy with her care from [facility]. Dr. Dub Mikes now seeing patients again and she wants to get back with him but doesn't have his number.  Not in phone book.  Doesn't have or use the internet.        - Effexor XR - states when under Dr. Runell Gess care she was on 300 mg of effexor and did "great!".  [facility] she has been seeing an NP named [employer].  Would only give her up to 225 mg.  This was not helping enough so she switched her to citalopram.  Rubin Payor did not like citalopram at all.  SHe has been tryign to call and talk about this "with anyone" at [facility] but no one is returning her call, eventhough the receptionists has told her someone will.  Last tried to call FRiday (almost 1 week ago).  She stopped citalopram 2.5 weeks ago.  Still taking depakote and perphenazine.  No SI.   Just nervous, irritable, unhappy.  WAnts to go back on effexor and wants to get up to 300 mg dose.       - Temazepam - uses to sleep.  Helps a lot.  Has been on for awhile.  STates dose is usually 30mg  1-2 tabs qHS (and our records confirm this) but her last rx was written as only 1 tab qHS and so she ran out early and the pharmacy won't refill, and as stated earlier, she can't get ahold of anyone at [facility].  Brought bottle with her.  Was filled 01/06/10 and has 2 tabs left.  Was a quantity of 30.  Can't sleep without it and often needs 2 tabs.    Habits & Providers  Alcohol-Tobacco-Diet     Tobacco Status: never  Current Medications (verified): 1)  Hydrochlorothiazide 25 Mg  Tabs (Hydrochlorothiazide) .... Take 1 Tab By Mouth Every Morning 2)  Acyclovir 800 Mg  Tabs (Acyclovir) .Marland Kitchen.. 1 By Mouth Three Times A Day 3)  Lisinopril 10 Mg Tabs (Lisinopril) .Marland Kitchen.. 1 By Mouth Daily 4)  Alprazolam 0.5 Mg Tabs (Alprazolam) .Marland Kitchen.. 1-2 By Mouth Every 8 Hours As Needed For Anxiety 5)  Perphenazine 4 Mg Tabs (Perphenazine) .Marland Kitchen.. 1 By Mouth At Bedtime 6)  Temazepam 30 Mg Caps (Temazepam) .Marland Kitchen.. 1-2 Tab By Mouth At Bedtime 7)  Effexor Xr 75 Mg Xr24h-Cap (Venlafaxine Hcl) .Marland Kitchen.. 1 Tab By Mouth Daily For 4 Days, Then 2 Tabs For 4 Days, Then 3 Tabs Daily 8)  Depakote 500 Mg Tbec (Divalproex Sodium) .Marland Kitchen.. 1 Tab By Mouth Daily Per [facility] 9)  Doxycycline Hyclate 50 Mg Caps (Doxycycline Hyclate) .Marland Kitchen.. 1 Tab By Mouth Two Times A Day Per Dr Cassie Freer 10)  Tramadol Hcl 50 Mg Tabs (Tramadol Hcl) .... Take 1-2 Pills By Mouth Every 6 Hours As Needed For Pain 11)  Vicodin 5-500 Mg Tabs (Hydrocodone-Acetaminophen) .Marland Kitchen.. 1-2 Tabs By Mouth Q 6 Hrs As Needed Pain 12)  Flexeril 5 Mg Tabs (Cyclobenzaprine Hcl) .Marland Kitchen.. 1 Tab By Mouth Q 6 Hrs As Needed Pain or Spasm  Allergies: 1)  ! Benadryl (Diphenhydramine Hcl)  Physical Exam  General:  thin, alert, NAD but slightly anxious/agitated vitals reviewed and rechecked..  Lungs:  Normal  respiratory effort, chest expands symmetrically. Lungs are clear to auscultation, no crackles or wheezes. Heart:  Normal rate and regular rhythm. S1 and S2 normal without gallop, murmur, click, rub or other extra sounds. Pulses:  2+ radial and dp Extremities:  no edema Psych:  Oriented X3, memory intact for recent and remote, normally interactive, good eye contact, and moderately anxious.     Impression & Recommendations:  Problem # 1:  DEPRESSION, MAJOR (ICD-296.20) Assessment Deteriorated States bipolar was a "misdiagnosis".  Does appear to have anxiety associated with her depression.  Gives history of good response to effexor and good toleration.  Max dosage is 225 a day.  Discussed this and that I would prefer her to try the 225 mg dose again, try to get in with Dr. Dub Mikes before increasing to 300, if he feels that is appropriate.  If takes too long to get in to see him, we can possibly try 300 dose.  She will follow-up with Dr. Janalyn Harder in 2-4 weeks.  She was given Dr. Runell Gess contact information.  See patient instructions.   Problem # 2:  INSOMNIA (ICD-780.52) Assessment: Deteriorated Since our records confirmed the 1-2 tabs dose, did refill for her one time only.  Her updated medication list for this problem includes:    Temazepam 30 Mg Caps (Temazepam) .Marland Kitchen... 1-2 tab by mouth at bedtime  Problem # 3:  ESSENTIAL HYPERTENSION, BENIGN (ICD-401.1) Assessment: Deteriorated Restart meds and follow-up with PCP in 2-4 weeks.  Consider checking BMET to check Cr, K after re-initiation, though labwork was normal in January and patient has been on med long-term.  Her updated medication list for this problem includes:    Hydrochlorothiazide 25 Mg Tabs (Hydrochlorothiazide) .Marland Kitchen... Take 1 tab by mouth every morning    Lisinopril 10 Mg Tabs (Lisinopril) .Marland Kitchen... 1 by mouth daily  Complete Medication List: 1)  Hydrochlorothiazide 25 Mg Tabs (Hydrochlorothiazide) .... Take 1 tab by mouth every morning 2)   Acyclovir 800 Mg Tabs (Acyclovir) .Marland Kitchen.. 1 by mouth three times a day 3)  Lisinopril 10 Mg Tabs (Lisinopril) .Marland Kitchen.. 1 by mouth  daily 4)  Alprazolam 0.5 Mg Tabs (Alprazolam) .Marland Kitchen.. 1-2 by mouth every 8 hours as needed for anxiety 5)  Perphenazine 4 Mg Tabs (Perphenazine) .Marland Kitchen.. 1 by mouth at bedtime 6)  Temazepam 30 Mg Caps (Temazepam) .Marland Kitchen.. 1-2 tab by mouth at bedtime 7)  Effexor Xr 75 Mg Xr24h-cap (Venlafaxine hcl) .Marland Kitchen.. 1 tab by mouth daily for 4 days, then 2 tabs for 4 days, then 3 tabs daily 8)  Depakote 500 Mg Tbec (Divalproex sodium) .Marland Kitchen.. 1 tab by mouth daily per [facility] 9)  Doxycycline Hyclate 50 Mg Caps (Doxycycline hyclate) .Marland Kitchen.. 1 tab by mouth two times a day per dr hall jr 10)  Tramadol Hcl 50 Mg Tabs (Tramadol hcl) .... Take 1-2 pills by mouth every 6 hours as needed for pain 11)  Vicodin 5-500 Mg Tabs (Hydrocodone-acetaminophen) .Marland Kitchen.. 1-2 tabs by mouth q 6 hrs as needed pain 12)  Flexeril 5 Mg Tabs (Cyclobenzaprine hcl) .Marland Kitchen.. 1 tab by mouth q 6 hrs as needed pain or spasm  Patient Instructions: 1)  It was nice to see you again today.  I am sorry you are having trouble with [facility]. 2)  Dr. Madie Reno Lugo's office number is [phone].  He is part of [facility].  His office is at [address]  Please call him to try to establish with him as soon as you can. 3)  I have restarted your effexor.  The maximum dose is supposed to be 225mg  per day.  As I am not a psychiatrist, I am not comfortable prescribing doses greater than the maximum allowed.  Please stay at 225 until you are able to see Dr. Dub Mikes.  If you are unable or it is a very long time off, let us know. 4)  Since you have been off antidepressants all together for 2.5 weeks and off effexor itself even longer, we need to titrate you up.  Please take 1 tab a day for 4 days, then you can take 2 tabs a day for 4 days, then you can take 3 tabs a day for 4 days.  Remain there until seen by Dr. Dub Mikes.   5)  Please  restart your BP pills today. 6)  Please make an appt with Dr. Janalyn Harder to follow-up your BP and discuss how you are doing on the effexor XR.  Prescriptions: TEMAZEPAM 30 MG CAPS (TEMAZEPAM) 1-2 tab by mouth at bedtime  #68 x 0   Entered by:   Ardeen Garland  MD   Authorized by:   Zachery Dauer MD   Signed by:   Ardeen Garland  MD on 01/31/2010   Method used:   Print then Give to Patient   RxID:   [id number] LISINOPRIL 10 MG TABS (LISINOPRIL) 1 by mouth daily  #34 x 5   Entered by:   Ardeen Garland  MD   Authorized by:   Zachery Dauer MD   Signed by:   Ardeen Garland  MD on 01/31/2010   Method used:   Print then Give to Patient   RxID:   [id number] HYDROCHLOROTHIAZIDE 25 MG  TABS (HYDROCHLOROTHIAZIDE) Take 1 tab by mouth every morning  #34 x 5   Entered by:   Ardeen Garland  MD   Authorized by:   Zachery Dauer MD   Signed by:   Ardeen Garland  MD on 01/31/2010   Method used:   Print then Give to Patient   RxID:   [id number] EFFEXOR XR 75 MG  XR24H-CAP (VENLAFAXINE HCL) 1 tab by mouth daily for 4 days, then 2 tabs for 4 days, then 3 tabs daily  #90 x 2   Entered by:   Ardeen Garland  MD   Authorized by:   Zachery Dauer MD   Signed by:   Ardeen Garland  MD on 01/31/2010   Method used:   Print then Give to Patient   RxID:   [id number]   Prevention & Chronic Care Immunizations   Influenza vaccine: Not documented   Influenza vaccine deferral: Refused  (07/09/2009)    Tetanus booster: 02/07/2004: Done.   Tetanus booster due: 02/06/2014    Pneumococcal vaccine: Not documented    H. zoster vaccine: Not documented  Colorectal Screening   Hemoccult: Not documented    Colonoscopy: Not documented  Other Screening   Pap smear: Done.  (03/08/2005)   Pap smear due: Not Indicated    Mammogram: normal  (04/24/2008)   Mammogram due: 08/2010    DXA bone density scan: Not documented   Smoking status: never  (01/31/2010)  Diabetes Mellitus   HgbA1C: 6.3  (06/06/2009)   Hemoglobin A1C due:  03/16/2009    Eye exam: Cataract surgery  (12/07/2008)   Diabetic eye exam action/deferral: Not indicated  (07/09/2009)    Foot exam: yes  (07/09/2009)   Foot exam action/deferral: Do today   High risk foot: No  (07/09/2009)   Foot care education: Done  (07/09/2009)   Foot exam due: 07/09/2010    Urine microalbumin/creatinine ratio: Not documented  Lipids   Total Cholesterol: 245  (06/21/2008)   Lipid panel action/deferral: Lipid Panel ordered   LDL: 144  (06/21/2008)   LDL Direct: 133  (10/25/2008)   HDL: 41  (06/21/2008)   Triglycerides: 301  (06/21/2008)    SGOT (AST): 20  (09/21/2009)   SGPT (ALT): 26  (09/21/2009)   Alkaline phosphatase: 59  (09/21/2009)   Total bilirubin: 0.4  (09/21/2009)  Hypertension   Last Blood Pressure: 150 / 89  (01/31/2010)   Serum creatinine: 0.75  (09/21/2009)   Serum potassium 4.6  (09/21/2009)    Hypertension flowsheet reviewed?: Yes   Progress toward BP goal: Deteriorated  Self-Management Support :   Personal Goals (by the next clinic visit) :     Personal A1C goal: 6  (07/09/2009)     Personal blood pressure goal: 130/80  (07/09/2009)     Personal LDL goal: 100  (07/09/2009)    Diabetes self-management support: Not documented    Hypertension self-management support: Not documented    Lipid self-management support: Not documented     Appended Document: Orders Update signed ov before inputting provider charge.    Clinical Lists Changes  Orders: Added new Test order of [facility]- Est  Level 4 (32440) - Signed

## 2010-10-10 NOTE — Progress Notes (Signed)
Summary: triage  Phone Note Call from Patient Call back at Home Phone [phone]   Caller: Patient Summary of Call: pain in right hip - wants to come in asap Initial call taken by: De Nurse,  September 11, 2009 11:20 AM  Follow-up for Phone Call        denies trauma. pain off & on for "quite a long time" "bad since last 4 weeks". taking large doses of aleve & ibuprofen. advised to back off & take with food. appt made for 3pm work in. her car is in the shop & she cannot come now. states if it is not ready, she will ride the bus here. aware of wait & that pcp will not be seeing her Follow-up by: Golden Circle RN,  September 11, 2009 11:24 AM

## 2010-10-10 NOTE — Assessment & Plan Note (Signed)
Summary: problem with hands,tcb   Vital Signs:  Patient profile:   70 year old female Height:      61 inches Weight:      167 pounds BMI:     31.67 BSA:     1.75 Temp:     98.9 degrees F Pulse rate:   88 / minute BP sitting:   153 / 90  Vitals Entered By: Jone Baseman CMA (September 21, 2009 1:40 PM) CC: problem with hands Is Patient Diabetic? No Pain Assessment Patient in pain? yes     Location: hands Intensity: 7   Primary Care Provider:  Cat Ta MD  CC:  problem with hands.  History of Present Illness: 1. Problem with hands Woke up Wednesday am with cramping and pain in both hands. States it took about 1.5 hours for her hands to loosen up so that she could use them. Got better as the day went on, but then woke up Thursday with similar pain in both thumbs. Now has pain today in the neck and going down both arms. R side is a little worse. Took ibuprofen without much improvement in her symptoms. Notes that she started pravachol in november..previously unable to tolerate a simva due to muscle aches  PMHx: bipolar, depression, "significant psychiatric impairments" per social history  Habits & Providers  Alcohol-Tobacco-Diet     Tobacco Status: never  Current Medications (verified): 1)  Hydrochlorothiazide 25 Mg  Tabs (Hydrochlorothiazide) .... Take 1 Tab By Mouth Every Morning 2)  Acyclovir 800 Mg  Tabs (Acyclovir) .Marland Kitchen.. 1 By Mouth Three Times A Day 3)  Lisinopril 10 Mg Tabs (Lisinopril) .Marland Kitchen.. 1 By Mouth Daily 4)  Alprazolam 0.5 Mg Tabs (Alprazolam) .Marland Kitchen.. 1-2 By Mouth Every 8 Hours As Needed For Anxiety 5)  Perphenazine 4 Mg Tabs (Perphenazine) .Marland Kitchen.. 1 By Mouth At Bedtime 6)  Temazepam 30 Mg Caps (Temazepam) .Marland Kitchen.. 1-2 Tab By Mouth At Bedtime 7)  Effexor Xr 75 Mg Xr24h-Cap (Venlafaxine Hcl) .... 3 Tab By Mouth Daily. Prescribed By [facility] 8)  Depakote 500 Mg Tbec (Divalproex Sodium) .Marland Kitchen.. 1 Tab By Mouth Daily Per [facility] 9)  Doxycycline Hyclate 50 Mg Caps (Doxycycline  Hyclate) .Marland Kitchen.. 1 Tab By Mouth Two Times A Day Per Dr Cassie Freer 10)  Tramadol Hcl 50 Mg Tabs (Tramadol Hcl) .... Take 1-2 Pills By Mouth Every 6 Hours As Needed For Pain  Allergies (verified): 1)  ! Benadryl (Diphenhydramine Hcl)  Review of Systems       denies any chest pain, SOB, fevers, chills, vomiting or diarrhea.   Physical Exam  General:  Anxious affect, answers questions appropriate. Well-dressed Msk:  No hand/wrist swelling or deformity Neurologic:  UE senstation intact bilaterally. 3/5 grip strength, 3+/5 shoulder abduction strength bilaterally; spurling's negative. Tender with squeezing biceps and triceps muscle. Has FROM at wrist and fingers. Limited ROM with forward abduction at the shoulder.  **question effort with exam. Pt shakes my hand more firmly than she does with grip testing. Also able to remove/put on outer shirt, move arms overhead when not being examined.  Skin:  Intact without suspicious lesions or rashes   Impression & Recommendations:  Problem # 1:  UNSPECIFIED MYALGIA AND MYOSITIS (ICD-729.1) Assessment New Unclear etiology and pt's exam is inconsistent and unrevealing -- especially given pt's questionable effort.. Consider viral myositis vs. myalgias from statin vs. psych interplay. Will hold pravastatin for now, check CBC, CMP, and CRP. follow-up soon with Dr. Janalyn Harder. Tramadol for pain.  The following  medications were removed from the medication list:    Flexeril 5 Mg Tabs (Cyclobenzaprine hcl) .Marland Kitchen... Take one by mouth every 8 hours as needecd for spasm Her updated medication list for this problem includes:    Tramadol Hcl 50 Mg Tabs (Tramadol hcl) .Marland Kitchen... Take 1-2 pills by mouth every 6 hours as needed for pain  Orders: CBC w/Diff-[facility] ([address]) Comp Met-[facility] (60454-09811) CRP, high sensitivity-[facility] (91478-29562) [facility]- Est Level  3 (13086)  Complete Medication List: 1)  Hydrochlorothiazide 25 Mg Tabs (Hydrochlorothiazide) .... Take 1 tab by mouth every morning 2)   Acyclovir 800 Mg Tabs (Acyclovir) .Marland Kitchen.. 1 by mouth three times a day 3)  Lisinopril 10 Mg Tabs (Lisinopril) .Marland Kitchen.. 1 by mouth daily 4)  Alprazolam 0.5 Mg Tabs (Alprazolam) .Marland Kitchen.. 1-2 by mouth every 8 hours as needed for anxiety 5)  Perphenazine 4 Mg Tabs (Perphenazine) .Marland Kitchen.. 1 by mouth at bedtime 6)  Temazepam 30 Mg Caps (Temazepam) .Marland Kitchen.. 1-2 tab by mouth at bedtime 7)  Effexor Xr 75 Mg Xr24h-cap (Venlafaxine hcl) .... 3 tab by mouth daily. prescribed by [facility] 8)  Depakote 500 Mg Tbec (Divalproex sodium) .Marland Kitchen.. 1 tab by mouth daily per [facility] 9)  Doxycycline Hyclate 50 Mg Caps (Doxycycline hyclate) .Marland Kitchen.. 1 tab by mouth two times a day per dr hall jr 10)  Tramadol Hcl 50 Mg Tabs (Tramadol hcl) .... Take 1-2 pills by mouth every 6 hours as needed for pain  Patient Instructions: 1)  stop the pravachol (pravastatin). 2)  take the tramadol for your pain. 3)  we'll let you know about the bloodwork.Marland Kitchen 4)  Use heat on your sore areas 3 times a day for 20 minutes. 5)  Try to keep moving. 6)  follow-up in one week with Dr. Janalyn Harder. Prescriptions: TRAMADOL HCL 50 MG TABS (TRAMADOL HCL) take 1-2 pills by mouth every 6 hours as needed for pain  #30 x 0   Entered and Authorized by:   Myrtie Soman  MD   Signed by:   Myrtie Soman  MD on 09/21/2009   Method used:   Electronically to        [facility] [address] [id number]* (retail)       [address] [place], [place]  [address]       Ph: [phone]       Fax: [phone]   RxID:   [id number]

## 2010-10-10 NOTE — Assessment & Plan Note (Signed)
Summary: calf pain/[place]/Yonael Tulloch   Vital Signs:  Patient profile:   70 year old female Weight:      166 pounds Temp:     98.2 degrees F oral Pulse rate:   80 / minute Pulse rhythm:   regular BP sitting:   123 / 69  (left arm) Cuff size:   regular  Vitals Entered By: Loralee Pacas CMA (February 14, 2010 11:12 AM) CC: calf pain Pain Assessment Patient in pain? yes     Location: calf Intensity: 8 Onset of pain  With activity   Primary Care Provider:  Herminia Warren MD  CC:  calf pain.  History of Present Illness: 70 yo F here for L calf pain.    For exercise she  Walks 3-4 days per week for 23 minutes, around [place].  [place] are [place] around [place] neighborhood.  On Monday she woke up regularly and was getting ready for the day, when she noticed left calf pain.  Pain is worse with walking.  No swelling. Cannot recall injury. Has been taking ibuprofen  800mg  three times a day not helping.  Pain is sharp and does not radiate.  Denies hip pain, knee pain, foot or ankle pain.    Current Medications (verified): 1)  Hydrochlorothiazide 25 Mg  Tabs (Hydrochlorothiazide) .... Take 1 Tab By Mouth Every Morning 2)  Acyclovir 800 Mg  Tabs (Acyclovir) .Marland Kitchen.. 1 By Mouth Three Times A Day 3)  Lisinopril 10 Mg Tabs (Lisinopril) .Marland Kitchen.. 1 By Mouth Daily 4)  Alprazolam 0.5 Mg Tabs (Alprazolam) .Marland Kitchen.. 1-2 By Mouth Every 8 Hours As Needed For Anxiety 5)  Perphenazine 4 Mg Tabs (Perphenazine) .Marland Kitchen.. 1 By Mouth At Bedtime 6)  Temazepam 30 Mg Caps (Temazepam) .Marland Kitchen.. 1-2 Tab By Mouth At Bedtime 7)  Effexor Xr 75 Mg Xr24h-Cap (Venlafaxine Hcl) .Marland Kitchen.. 1 Tab By Mouth Daily For 4 Days, Then 2 Tabs For 4 Days, Then 3 Tabs Daily 8)  Depakote 500 Mg Tbec (Divalproex Sodium) .Marland Kitchen.. 1 Tab By Mouth Daily Per [facility] 9)  Doxycycline Hyclate 50 Mg Caps (Doxycycline Hyclate) .Marland Kitchen.. 1 Tab By Mouth Two Times A Day Per Dr Cassie Freer 10)  Tramadol Hcl 50 Mg Tabs (Tramadol Hcl) .... Take 1-2 Pills By Mouth Every 6 Hours As Needed For Pain 11)   Vicodin 5-500 Mg Tabs (Hydrocodone-Acetaminophen) .Marland Kitchen.. 1-2 Tabs By Mouth Q 6 Hrs As Needed Pain 12)  Flexeril 5 Mg Tabs (Cyclobenzaprine Hcl) .Marland Kitchen.. 1 Tab By Mouth Q 6 Hrs As Needed Pain or Spasm  Allergies (verified): 1)  ! Benadryl (Diphenhydramine Hcl)  Past History:  Past Medical History: Last updated: 02/19/2009 Xanax OD in 10/06 Current Problems:  DIZZINESS (ICD-780.4) INSOMNIA (ICD-780.52) DIABETES MELLITUS, TYPE II (ICD-250.00) CATARACTS (ICD-366.9) GENITAL HERPES, HX OF (ICD-V13.8) ESSENTIAL HYPERTENSION, BENIGN (ICD-401.1) ABDOMINAL PAIN, CHRONIC (ICD-789.00) DEPRESSION, MAJOR (ICD-296.20) RHINITIS, ALLERGIC (ICD-477.9) HYPERCHOLESTEROLEMIA (ICD-272.0) BIPOLAR DISORDER (ICD-296.7)  Past Surgical History: Last updated: 07/09/2009 Cataract surgery: L eye 12/2008, R eye 05/2009 by Dr Hazle Quant eye surgery as a child Tubal ligation - 09/08/1969  Family History: Last updated: 05/17/2008 parents both with psychiatric illnesses.  Mother had breast cancer.  Social History: Last updated: 02/19/2009 Occassional EtOH.  Never smoked.  Denies drugs.  OD's on xanax in 2006.  Has 2 cats.  Is an Tree surgeon.  Meredeth Ide and stretches for exercises.  Single - divorced 12 years ago. severe psychiatric impairments  Risk Factors: Alcohol Use: 0 (07/09/2009)  Risk Factors: Smoking Status: never (01/31/2010)  Review of Systems  per hpi  Physical Exam  General:  Well-developed,well-nourished,in no acute distress; alert,appropriate and cooperative throughout examination. vitals reviewed Msk:  Gait is slowed, there are pain with stepping.normal ROM, no joint tenderness, no joint swelling, no joint warmth, no redness over joints, no joint deformities, no joint instability, no crepitation, and no muscle atrophy.   Cannot palpate area of pain on her calf.     Impression & Recommendations:  Problem # 1:  LEG PAIN, LEFT (ICD-729.5) Assessment New Left calf pain x 3 days.  Cannot palpate area  of pain.  Muscle spasm?  Refer pain from left hip?  Exam was wnl, but she does feel some tightness in her left hip/thigh with lateral rotation.  Will try flexeril, which pt already has.  Will rtc in 1-2 wks if not better.  May get xray at that time.   Orders: [facility]- Est Level  3 (16109)  Complete Medication List: 1)  Hydrochlorothiazide 25 Mg Tabs (Hydrochlorothiazide) .... Take 1 tab by mouth every morning 2)  Acyclovir 800 Mg Tabs (Acyclovir) .Marland Kitchen.. 1 by mouth three times a day 3)  Lisinopril 10 Mg Tabs (Lisinopril) .Marland Kitchen.. 1 by mouth daily 4)  Alprazolam 0.5 Mg Tabs (Alprazolam) .Marland Kitchen.. 1-2 by mouth every 8 hours as needed for anxiety 5)  Perphenazine 4 Mg Tabs (Perphenazine) .Marland Kitchen.. 1 by mouth at bedtime 6)  Temazepam 30 Mg Caps (Temazepam) .Marland Kitchen.. 1-2 tab by mouth at bedtime 7)  Effexor Xr 75 Mg Xr24h-cap (Venlafaxine hcl) .Marland Kitchen.. 1 tab by mouth daily for 4 days, then 2 tabs for 4 days, then 3 tabs daily 8)  Depakote 500 Mg Tbec (Divalproex sodium) .Marland Kitchen.. 1 tab by mouth daily per [facility] 9)  Doxycycline Hyclate 50 Mg Caps (Doxycycline hyclate) .Marland Kitchen.. 1 tab by mouth two times a day per dr hall jr 10)  Tramadol Hcl 50 Mg Tabs (Tramadol hcl) .... Take 1-2 pills by mouth every 6 hours as needed for pain 11)  Vicodin 5-500 Mg Tabs (Hydrocodone-acetaminophen) .Marland Kitchen.. 1-2 tabs by mouth q 6 hrs as needed pain 12)  Flexeril 5 Mg Tabs (Cyclobenzaprine hcl) .Marland Kitchen.. 1 tab by mouth q 6 hrs as needed pain or spasm  Patient Instructions: 1)  Please schedule a follow-up appointment in 10 -14 days for left leg pain.  2)  Try Flexeril 5mg , which is a muscle relaxant.  Can take up to three times a day. 3)  If not better, then we may get xrays next.

## 2010-10-10 NOTE — Assessment & Plan Note (Signed)
Summary: abdominal pain off Nd on for 2 weeks, vomited on time last ni...   Vital Signs:  Patient profile:   70 year old female Weight:      164.7 pounds BMI:     31.23 Temp:     98.6 degrees F oral Pulse rate:   86 / minute BP sitting:   129 / 85  (right arm)  Vitals Entered By: Arlyss Repress CMA, (August 05, 2010 3:00 PM) CC: vomitting x 1. constipation, bloating, gas x 2 weeks. also has genital herpes outbreak x 5 days. pt is on acyclovir. Is Patient Diabetic? No Pain Assessment Patient in pain? no        Primary Care Provider:  Cat Ta MD  CC:  vomitting x 1. constipation, bloating, and gas x 2 weeks. also has genital herpes outbreak x 5 days. pt is on acyclovir.Marland Kitchen  History of Present Illness: Janice Ramos feels bad, she reports outbreak of genital herpes that always makes her feel bad.  She is constipated, recently psychiatrist changed antidepressant from Effexor to Cymbalta, she has apt with his Dub Mikes( this week).  She vomited last night once, and her stomach is very uncomfortable with complaints of gas and cramping.  Habits & Providers  Alcohol-Tobacco-Diet     Tobacco Status: never  Current Medications (verified): 1)  Hydrochlorothiazide 25 Mg  Tabs (Hydrochlorothiazide) .... Take 1 Tab By Mouth Every Morning 2)  Acyclovir 800 Mg  Tabs (Acyclovir) .Marland Kitchen.. 1 By Mouth Five Time A Day For An Outbreak, and One Daily For Suppression. 3)  Lisinopril 10 Mg Tabs (Lisinopril) .Marland Kitchen.. 1 By Mouth Daily 4)  Alprazolam 0.5 Mg Tabs (Alprazolam) .Marland Kitchen.. 1 Tab By Mouth At Bedtime As Needed Insomnia/anxiety 5)  Perphenazine 4 Mg Tabs (Perphenazine) .Marland Kitchen.. 1 By Mouth At Bedtime 6)  Temazepam 30 Mg Caps (Temazepam) .Marland Kitchen.. 1-2 Tab By Mouth At Bedtime 7)  Depakote 500 Mg Tbec (Divalproex Sodium) .Marland Kitchen.. 1 Tab By Mouth Daily Per [facility] 8)  Cymbalta 30 Mg Cpep (Duloxetine Hcl) .... Per Psych 9)  Polyethylene Glycol 3350  Powd (Polyethylene Glycol 3350) .Marland KitchenMarland KitchenMarland Kitchen 17 Gm in 4 Oz Water Two Times A Day For 2-3 Days  Then Daily, Qs  Allergies: 1)  ! Benadryl (Diphenhydramine Hcl) 2)  ! * Xanax  Review of Systems General:  Complains of malaise; denies chills, fever, loss of appetite, sleep disorder, and sweats. GI:  Complains of abdominal pain, constipation, gas, nausea, and vomiting. GU:  Complains of genital sores.  Physical Exam  General:  Anxious, good historian, moved easily on and off exam table. Lungs:  normal respiratory effort and normal breath sounds.   Heart:  normal rate and regular rhythm.   Abdomen:  soft and normal bowel sounds.  Mild diffuse tenderness Rectal:  large amount of soft stool in rectum, heme neg    Impression & Recommendations:  Problem # 1:  CONSTIPATION (ICD-564.00) may be related to change in antidepressant, add PEG, recommended one fleets to get things started Her updated medication list for this problem includes:    Polyethylene Glycol 3350 Powd (Polyethylene glycol 3350) .Marland KitchenMarland KitchenMarland KitchenMarland Kitchen 17 gm in 4 oz water two times a day for 2-3 days then daily, qs  Problem # 2:  GENITAL HERPES (ICD-054.10) on suppression per her dermatologist, upset because that she does not get enough pills for daily suppression and for treatment with outbreaks.  New script for #90.  She should not be having outbreaks with suppression but it sounds like she is in a  cycle of using up her pills during an outbreak and not having enough to take daily.  Problem # 3:  DEPRESSION, MAJOR (ICD-296.20) Has apt with psych this week, recent change to Cymbalta  Complete Medication List: 1)  Hydrochlorothiazide 25 Mg Tabs (Hydrochlorothiazide) .... Take 1 tab by mouth every morning 2)  Acyclovir 800 Mg Tabs (Acyclovir) .Marland Kitchen.. 1 by mouth five time a day for an outbreak, and one daily for suppression. 3)  Lisinopril 10 Mg Tabs (Lisinopril) .Marland Kitchen.. 1 by mouth daily 4)  Alprazolam 0.5 Mg Tabs (Alprazolam) .Marland Kitchen.. 1 tab by mouth at bedtime as needed insomnia/anxiety 5)  Perphenazine 4 Mg Tabs (Perphenazine) .Marland Kitchen.. 1 by mouth  at bedtime 6)  Temazepam 30 Mg Caps (Temazepam) .Marland Kitchen.. 1-2 tab by mouth at bedtime 7)  Depakote 500 Mg Tbec (Divalproex sodium) .Marland Kitchen.. 1 tab by mouth daily per [facility] 8)  Cymbalta 30 Mg Cpep (Duloxetine hcl) .... Per psych 9)  Polyethylene Glycol 3350 Powd (Polyethylene glycol 3350) .Marland KitchenMarland Kitchen. 17 gm in 4 oz water two times a day for 2-3 days then daily, qs  Patient Instructions: 1)  recommend fleets enema to get things going 2)  Miralax two times a day for 2-3 days then daily  Prescriptions: ACYCLOVIR 800 MG  TABS (ACYCLOVIR) 1 by mouth five time a day for an outbreak, and one daily for suppression. Brand medically necessary #90 x 3   Entered and Authorized by:   Luretha Murphy NP   Signed by:   Luretha Murphy NP on 08/05/2010   Method used:   Print then Give to Patient   RxID:   [id number] POLYETHYLENE GLYCOL 3350  POWD (POLYETHYLENE GLYCOL 3350) 17 GM in 4 oz water two times a day for 2-3 days then daily, QS Brand medically necessary #1 x 3   Entered and Authorized by:   Luretha Murphy NP   Signed by:   Luretha Murphy NP on 08/05/2010   Method used:   Print then Give to Patient   RxID:   [id number]   Appended Document: Orders Update    Clinical Lists Changes  Orders: Added new Test order of [facility]- Est  Level 4 (01027) - Signed

## 2010-10-10 NOTE — Progress Notes (Signed)
Summary: wi req  Phone Note Call from Patient Call back at Home Phone [phone]   Reason for Call: Talk to Nurse Complaint: Abdominal Pain Summary of Call: req wi appt, pt vomitting Initial call taken by: Knox Royalty,  August 05, 2010 10:23 AM  Follow-up for Phone Call        patient states she vomited one time at 2:00 AM  this AM.  has had intense abdominal pain off and on for 2 weeks .  no fever today. appointment scheduled this afternoon for work in. Follow-up by: Theresia Lo RN,  August 05, 2010 11:04 AM

## 2010-10-10 NOTE — Miscellaneous (Signed)
Summary: pain  Clinical Lists Changes states she is having a herpes outbreak & is in a lot of pain. has acyclovir at home but needs something for th epain. says she must see a doctor today. placed with Dr. Georgiana Shore at [facility] RN  Jan 10, 2010 2:11 PM

## 2010-10-10 NOTE — Progress Notes (Signed)
Summary: triage  Phone Note Call from Patient Call back at Home Phone [phone]   Caller: Patient Summary of Call: ear aches/sore throat -  Initial call taken by: De Nurse,  October 29, 2009 9:37 AM  Follow-up for Phone Call        ear pain started a few days ago. sore throat & cold started earlier than that. ibu not helping. work in at 10:30 today Follow-up by: Golden Circle RN,  October 29, 2009 9:50 AM

## 2010-10-10 NOTE — Assessment & Plan Note (Signed)
Summary: FU/TA NOT AVAIL/KH   Vital Signs:  Patient profile:   70 year old female Height:      61 inches Weight:      166.5 pounds BMI:     31.57 Temp:     97.2 degrees F oral Pulse rate:   90 / minute BP sitting:   129 / 81  (right arm) Cuff size:   regular  Vitals Entered By: Garen Grams LPN (September 28, 2009 2:46 PM) CC: f/u muscle pain Is Patient Diabetic? Yes Did you bring your meter with you today? No Pain Assessment Patient in pain? yes     Location: rt arm   Primary Care Provider:  Cat Ta MD  CC:  f/u muscle pain.  History of Present Illness: 1. follow-up muscle pain States that arm and hand pain is improved. Still notices it on the right arm some. States that pain improved "a little" with taking the tramadol. This medicine did make her sleepy. Wonders if this could be related to her depression  ROS: recently had a herpes outbreak, no fevers/chills; some off and on HAs.   Habits & Providers  Alcohol-Tobacco-Diet     Tobacco Status: never  Current Medications (verified): 1)  Hydrochlorothiazide 25 Mg  Tabs (Hydrochlorothiazide) .... Take 1 Tab By Mouth Every Morning 2)  Acyclovir 800 Mg  Tabs (Acyclovir) .Marland Kitchen.. 1 By Mouth Three Times A Day 3)  Lisinopril 10 Mg Tabs (Lisinopril) .Marland Kitchen.. 1 By Mouth Daily 4)  Alprazolam 0.5 Mg Tabs (Alprazolam) .Marland Kitchen.. 1-2 By Mouth Every 8 Hours As Needed For Anxiety 5)  Perphenazine 4 Mg Tabs (Perphenazine) .Marland Kitchen.. 1 By Mouth At Bedtime 6)  Temazepam 30 Mg Caps (Temazepam) .Marland Kitchen.. 1-2 Tab By Mouth At Bedtime 7)  Effexor Xr 75 Mg Xr24h-Cap (Venlafaxine Hcl) .... 3 Tab By Mouth Daily. Prescribed By [facility] 8)  Depakote 500 Mg Tbec (Divalproex Sodium) .Marland Kitchen.. 1 Tab By Mouth Daily Per [facility] 9)  Doxycycline Hyclate 50 Mg Caps (Doxycycline Hyclate) .Marland Kitchen.. 1 Tab By Mouth Two Times A Day Per Dr Cassie Freer 10)  Tramadol Hcl 50 Mg Tabs (Tramadol Hcl) .... Take 1-2 Pills By Mouth Every 6 Hours As Needed For Pain  Allergies (verified): 1)  !  Benadryl (Diphenhydramine Hcl)  Physical Exam  General:  Anxious affect, answers questions appropriate. Well-dressed. VS reviewed -- normal. Lungs:  Normal respiratory effort, chest expands symmetrically. Lungs are clear to auscultation, no crackles or wheezes. Heart:  Normal rate and regular rhythm. S1 and S2 normal without gallop, murmur, click, rub or other extra sounds. Msk:  No hand/wrist swelling or deformity on either side. Has normal grip strength bilaterally.  Psych:  Less anxious today. Alert and appropriate.   Impression & Recommendations:  Problem # 1:  UNSPECIFIED MYALGIA AND MYOSITIS (ICD-729.1) Assessment Improved  Still unsure about the specific diagnosis. Regardless, pt is improved. Advised pt to follow-up in one month with Dr. Janalyn Harder. Asked her to re-challenge with the simva, if she starts hurting again, she is to call and let us know. follow-up for FLP as well -- hasn't been done in a while.  pt is agreeable.  Her updated medication list for this problem includes:    Tramadol Hcl 50 Mg Tabs (Tramadol hcl) .Marland Kitchen... Take 1-2 pills by mouth every 6 hours as needed for pain  Orders: [facility]- Est Level  3 (16109)  Complete Medication List: 1)  Hydrochlorothiazide 25 Mg Tabs (Hydrochlorothiazide) .... Take 1 tab by mouth every morning 2)  Acyclovir 800 Mg Tabs (Acyclovir) .Marland Kitchen.. 1 by mouth three times a day 3)  Lisinopril 10 Mg Tabs (Lisinopril) .Marland Kitchen.. 1 by mouth daily 4)  Alprazolam 0.5 Mg Tabs (Alprazolam) .Marland Kitchen.. 1-2 by mouth every 8 hours as needed for anxiety 5)  Perphenazine 4 Mg Tabs (Perphenazine) .Marland Kitchen.. 1 by mouth at bedtime 6)  Temazepam 30 Mg Caps (Temazepam) .Marland Kitchen.. 1-2 tab by mouth at bedtime 7)  Effexor Xr 75 Mg Xr24h-cap (Venlafaxine hcl) .... 3 tab by mouth daily. prescribed by [facility] 8)  Depakote 500 Mg Tbec (Divalproex sodium) .Marland Kitchen.. 1 tab by mouth daily per [facility] 9)  Doxycycline Hyclate 50 Mg Caps (Doxycycline hyclate) .Marland Kitchen.. 1 tab by mouth two times a day per dr  hall jr 10)  Tramadol Hcl 50 Mg Tabs (Tramadol hcl) .... Take 1-2 pills by mouth every 6 hours as needed for pain  Other Orders: Future Orders: Lipid-[facility] ([id number]) ... 09/28/2010  Patient Instructions: 1)  restart the simvastatin to see how you do. If you start having muscle aches, stop the medicine and let us know. 2)  follow-up with Dr. Janalyn Harder in 1 month. 3)  follow-up in the next week or so for fasting cholesterol labs.

## 2010-10-10 NOTE — Progress Notes (Signed)
Summary: phn msg  Phone Note Call from Patient Call back at Home Phone [phone]   Caller: Patient Summary of Call: pt has tried calling Dr Dub Mikes and thinks it's a wrong number- can't find it in phone book either - pls advise Initial call taken by: De Nurse,  Jan 31, 2010 12:09 PM  Follow-up for Phone Call        Try [phone].  He may actually be at [facility]. Follow-up by: Lamar Laundry, MD May 26th, 2011 14:00

## 2010-10-10 NOTE — Assessment & Plan Note (Signed)
Summary: pain control & herpes/[place]/Ta   Vital Signs:  Patient profile:   70 year old female Weight:      165.7 pounds Temp:     98.6 degrees F oral Pulse rate:   94 / minute Pulse rhythm:   regular BP sitting:   142 / 82  (left arm) Cuff size:   regular  Vitals Entered By: Loralee Pacas CMA (Jan 10, 2010 3:02 PM) CC: pain control Pain Assessment Patient in pain? yes     Location: lower back Intensity: 10 Type: aching Onset of pain  Constant Comments pt states that her pain has gotten progressively worse over the last 3 days   Primary Care Provider:  Cat Ta MD  CC:  pain control.  History of Present Illness: Here for back pain that started 3 days ago after having to sleep on couch because she has house guests.  Pain is in bilateral lower back.  Hurts all the time.  Worse with certain movements.  Much worse when layign on the couch.  Has told guests she needs her bed back tonight.  Has had similar pain in the past but this is worse than previous.  No numbness or saddle paraesthesias.  No fevers or trauma.  No weakness.   Allergies: 1)  ! Benadryl (Diphenhydramine Hcl)  Physical Exam  General:  thin, alert, uncomfortable appearing vitals reviewed.  Abdomen:  pain across bilateral lower back over paraspinals.  negative SLR bilaterally.  FROM of extension, flexion, and lateral movement of back.    Impression & Recommendations:  Problem # 1:  BACK PAIN, LUMBAR (ICD-724.2) Assessment New  Likely spasm related to poor sleeping conditions.  ADvised returning to her bed, heat, and will try vicodin and flexeril.  If she does not tolerate vicodin will try the tramadol.  Refer to PT as is recurring problem for her and patient would like to try PT.  Her updated medication list for this problem includes:    Tramadol Hcl 50 Mg Tabs (Tramadol hcl) .Marland Kitchen... Take 1-2 pills by mouth every 6 hours as needed for pain    Vicodin 5-500 Mg Tabs (Hydrocodone-acetaminophen) .Marland Kitchen... 1-2 tabs by  mouth q 6 hrs as needed pain    Flexeril 5 Mg Tabs (Cyclobenzaprine hcl) .Marland Kitchen... 1 tab by mouth q 6 hrs as needed pain or spasm  Orders: [facility]- Est  Level 4 (60454) Physical Therapy Referral (PT)  Complete Medication List: 1)  Hydrochlorothiazide 25 Mg Tabs (Hydrochlorothiazide) .... Take 1 tab by mouth every morning 2)  Acyclovir 800 Mg Tabs (Acyclovir) .Marland Kitchen.. 1 by mouth three times a day 3)  Lisinopril 10 Mg Tabs (Lisinopril) .Marland Kitchen.. 1 by mouth daily 4)  Alprazolam 0.5 Mg Tabs (Alprazolam) .Marland Kitchen.. 1-2 by mouth every 8 hours as needed for anxiety 5)  Perphenazine 4 Mg Tabs (Perphenazine) .Marland Kitchen.. 1 by mouth at bedtime 6)  Temazepam 30 Mg Caps (Temazepam) .Marland Kitchen.. 1-2 tab by mouth at bedtime 7)  Effexor Xr 75 Mg Xr24h-cap (Venlafaxine hcl) .... 3 tab by mouth daily. prescribed by [facility] 8)  Depakote 500 Mg Tbec (Divalproex sodium) .Marland Kitchen.. 1 tab by mouth daily per [facility] 9)  Doxycycline Hyclate 50 Mg Caps (Doxycycline hyclate) .Marland Kitchen.. 1 tab by mouth two times a day per dr hall jr 10)  Tramadol Hcl 50 Mg Tabs (Tramadol hcl) .... Take 1-2 pills by mouth every 6 hours as needed for pain 11)  Vicodin 5-500 Mg Tabs (Hydrocodone-acetaminophen) .Marland Kitchen.. 1-2 tabs by mouth q 6 hrs  as needed pain 12)  Flexeril 5 Mg Tabs (Cyclobenzaprine hcl) .Marland Kitchen.. 1 tab by mouth q 6 hrs as needed pain or spasm Prescriptions: FLEXERIL 5 MG TABS (CYCLOBENZAPRINE HCL) 1 tab by mouth q 6 hrs as needed pain or spasm  #30 x 2   Entered and Authorized by:   Ardeen Garland  MD   Signed by:   Ardeen Garland  MD on 01/10/2010   Method used:   Print then Give to Patient   RxID:   [id number] VICODIN 5-500 MG TABS (HYDROCODONE-ACETAMINOPHEN) 1-2 tabs by mouth q 6 hrs as needed pain  #40 x 2   Entered and Authorized by:   Ardeen Garland  MD   Signed by:   Ardeen Garland  MD on 01/10/2010   Method used:   Print then Give to Patient   RxID:   [id number] TRAMADOL HCL 50 MG TABS (TRAMADOL HCL) take 1-2 pills by mouth every 6 hours as needed for  pain  #30 x 2   Entered and Authorized by:   Ardeen Garland  MD   Signed by:   Ardeen Garland  MD on 01/10/2010   Method used:   Print then Give to Patient   RxID:   [id number] TRAMADOL HCL 50 MG TABS (TRAMADOL HCL) take 1-2 pills by mouth every 6 hours as needed for pain  #30 x 2   Entered and Authorized by:   Ardeen Garland  MD   Signed by:   Ardeen Garland  MD on 01/10/2010   Method used:   Print then Give to Patient   RxID:   [id number] FLEXERIL 5 MG TABS (CYCLOBENZAPRINE HCL) 1 tab by mouth q 6 hrs as needed pain or spasm  #30 x 2   Entered and Authorized by:   Ardeen Garland  MD   Signed by:   Ardeen Garland  MD on 01/10/2010   Method used:   Print then Give to Patient   RxID:   [id number] VICODIN 5-500 MG TABS (HYDROCODONE-ACETAMINOPHEN) 1-2 tabs by mouth q 6 hrs as needed pain  #40 x 2   Entered and Authorized by:   Ardeen Garland  MD   Signed by:   Ardeen Garland  MD on 01/10/2010   Method used:   Print then Give to Patient   RxID:   [id number]

## 2010-10-10 NOTE — Progress Notes (Signed)
Summary: triage  Phone Note Call from Patient Call back at Home Phone [phone]   Caller: Patient Summary of Call: Pt having trouble with her leg. Initial call taken by: Clydell Hakim,  February 13, 2010 2:47 PM  Follow-up for Phone Call        was walking 23 minutes monday up [place]. then developed pain in L calf. describes it a being "like a ball"  in the calf muscle. taking 800mg  ibu tid with food per her pharmacist. .states this has not helped much. appt made suggested lying down & trying to gently pull toes up-dorisflexion. may try heat or cold. told her any problems before her appt, we have md on call Follow-up by: Golden Circle RN,  February 13, 2010 2:55 PM

## 2010-11-05 ENCOUNTER — Ambulatory Visit: Payer: Self-pay | Admitting: Family Medicine

## 2010-11-12 ENCOUNTER — Ambulatory Visit (INDEPENDENT_AMBULATORY_CARE_PROVIDER_SITE_OTHER): Payer: Medicare Other | Admitting: Family Medicine

## 2010-11-12 ENCOUNTER — Encounter: Payer: Self-pay | Admitting: Family Medicine

## 2010-11-12 VITALS — BP 138/80 | Temp 98.5°F | Ht 62.5 in | Wt 160.0 lb

## 2010-11-12 DIAGNOSIS — R32 Unspecified urinary incontinence: Secondary | ICD-10-CM

## 2010-11-12 DIAGNOSIS — R358 Other polyuria: Secondary | ICD-10-CM

## 2010-11-12 DIAGNOSIS — R3589 Other polyuria: Secondary | ICD-10-CM

## 2010-11-12 LAB — POCT UA - MICROSCOPIC ONLY

## 2010-11-12 LAB — POCT URINALYSIS DIPSTICK
Bilirubin, UA: NEGATIVE
Glucose, UA: NEGATIVE
Ketones, UA: NEGATIVE
Nitrite, UA: NEGATIVE
Protein, UA: NEGATIVE
Spec Grav, UA: 1.005
Urobilinogen, UA: 0.2
pH, UA: 6

## 2010-11-12 MED ORDER — CEPHALEXIN 500 MG PO CAPS: 500.0000 mg | ORAL_CAPSULE | Freq: Two times a day (BID) | ORAL | Status: AC

## 2010-11-12 NOTE — Assessment & Plan Note (Signed)
Urinary incontinence have DDx of UTI vs urge/stress/combo incontinence.  UA showed mod leuks, hematuria, lots of WBCs.  We will treat for UTI with Keflex x 7 days.  Pt was also taking Restoril which may also cause incontinence.  Pelvic exam today did not show prolapse or abnormality.  Pt to rtc in 2-3 weeks if still having incontinence.

## 2010-11-12 NOTE — Progress Notes (Signed)
  Subjective:    Patient ID: Janice Ramos, female    DOB: 02-Oct-1940, 70 y.o.   MRN: [id number]  HPI URINARY FREQUENCY: For the past few months she has had frequency.  She has increased voiding, about every 25 minutes.  She also has some leaking.  Leaking first occurred with coughing and laughing and sneezing, but recently she leaks all the time.  She states that "dribbing" is very recent.  She feels like "I got to go" all the time.  Sometimes voiding is "slightly uncomfortable", but no burning pain or hematuria.  G2P2 with SVD.  Has not had pelvic exams in several years.  No gyn surgeries.    Subjective feelings of hot and cold, but have not checked temp.  Does not feel like hot flashes, as this is not that bad.  She cannot quantify when she first noticed this, but it is frequent.  She had a herpes flare 16 days ago and attributes "hot and cold" to that.    Review of Systems  Per hpi      Objective:   Physical Exam GEN: NAD, alert, appropriate throughout exam PELVIC: No prolapse.  +external lesions that look like healing uclers/vesicles.  No vaginal discharge.  No bleeding.  No cervical lesions. No adenxal mass or tenderness.         Assessment & Plan:

## 2010-11-12 NOTE — Patient Instructions (Signed)
Your incontinence may be from bladder infection.  Take Keflex 500mg  twice a day for 7 days. If you still have symptoms in 2-3 weeks, come back to clinic and we will do further work up.

## 2010-11-12 NOTE — Progress Notes (Signed)
Addended by: [place], Starlet Gallentine on: 11/12/2010 03:06 PM   Modules accepted: Orders

## 2010-11-26 ENCOUNTER — Ambulatory Visit: Payer: Medicare Other | Admitting: Family Medicine

## 2010-12-10 ENCOUNTER — Encounter: Payer: Self-pay | Admitting: Home Health Services

## 2010-12-17 ENCOUNTER — Encounter: Payer: Self-pay | Admitting: Family Medicine

## 2010-12-17 ENCOUNTER — Ambulatory Visit (INDEPENDENT_AMBULATORY_CARE_PROVIDER_SITE_OTHER): Payer: Medicare Other | Admitting: Family Medicine

## 2010-12-17 DIAGNOSIS — B360 Pityriasis versicolor: Secondary | ICD-10-CM | POA: Insufficient documentation

## 2010-12-17 DIAGNOSIS — A6000 Herpesviral infection of urogenital system, unspecified: Secondary | ICD-10-CM

## 2010-12-17 DIAGNOSIS — N949 Unspecified condition associated with female genital organs and menstrual cycle: Secondary | ICD-10-CM

## 2010-12-17 DIAGNOSIS — R109 Unspecified abdominal pain: Secondary | ICD-10-CM

## 2010-12-17 DIAGNOSIS — R102 Pelvic and perineal pain: Secondary | ICD-10-CM | POA: Insufficient documentation

## 2010-12-17 DIAGNOSIS — R197 Diarrhea, unspecified: Secondary | ICD-10-CM | POA: Insufficient documentation

## 2010-12-17 LAB — COMPREHENSIVE METABOLIC PANEL
ALT: 16 U/L (ref 0–35)
AST: 20 U/L (ref 0–37)
Albumin: 4.4 g/dL (ref 3.5–5.2)
Alkaline Phosphatase: 58 U/L (ref 39–117)
BUN: 23 mg/dL (ref 6–23)
CO2: 24 mEq/L (ref 19–32)
Calcium: 9 mg/dL (ref 8.4–10.5)
Chloride: 101 mEq/L (ref 96–112)
Creat: 0.84 mg/dL (ref 0.40–1.20)
Glucose, Bld: 87 mg/dL (ref 70–99)
Potassium: 4.1 mEq/L (ref 3.5–5.3)
Sodium: 135 mEq/L (ref 135–145)
Total Bilirubin: 0.4 mg/dL (ref 0.3–1.2)
Total Protein: 6.6 g/dL (ref 6.0–8.3)

## 2010-12-17 LAB — LIPASE: Lipase: 38 U/L (ref 0–75)

## 2010-12-17 NOTE — Assessment & Plan Note (Signed)
Pt has had intermittent diarrhea, bloating, abd cramping x 2 wks.  No diarrhea today.  She is not sure if it is related to lactose intolerance.  Will check CBC and Cmet.  Discussed fluid rehydration.

## 2010-12-17 NOTE — Assessment & Plan Note (Addendum)
Pt with abd pain and diarrhea.  DDx include infectious process vs lactose intolerance vs gastroenteritis.  No travel history.  Pt appears nontoxic.  Abd exam is benign.  Will check CBC, Cmet, Lipase.

## 2010-12-17 NOTE — Assessment & Plan Note (Signed)
Pt with history of recurrent herpes.  This time her outbreak has lasted 5 wks.  I am concerned regarding immune process.  Will check CBC for now.  Pt has not been sexually active in 6 yrs.

## 2010-12-17 NOTE — Assessment & Plan Note (Signed)
R pelvic pain x 1 month.  She was seen 11/12/10 and was Dx with UTI.  Still having intermittent pelvic pain.  No abd vaginal bleeding.  Will get Transvag ultrasound.

## 2010-12-17 NOTE — Progress Notes (Signed)
  Subjective:    Patient ID: Janice Ramos, female    DOB: 25-Jul-1941, 70 y.o.   MRN: [id number]  HPI 70 y/o F with history of major depression, recurrent herpes, HLD,  Recurrent herpes: Currently 5 wks of breakout.  Areas include vaginal and pharyngeal lesions.  She has been taking Acyclovir 5x/day.  She thought the recurrence was over, but started again one week ago and still having flare.  This has been the longest duration of her herpes outbreak.  She is not sexually active.    GI complaints: Intermittent Bloating and nausea and diarrhea and abd pain x 2 wks, worsened last week, but feels better today.  Diarrhea is watery.  She had it for 4-5 times for the past 3 days, but not BM yet today.  +flatulance.  +belching.  -heartburn symptoms currently, but has occasional reflux in the past.  She is not sure if it related to dairy intake because she does eat and drink dairy products.  No travel history.    Gu complaints: Still having R pelvic pain.  Pt was seen 11/12/10 for pain but was Dx with UTI at that time.  She has finished course of antibiotics. Pain is intermittent and described at dull and cramping.  No vaginal discharge.  No vaginal bleeding. Not sexually active x 6 yrs.    Review of Systems  Constitutional: Positive for fever. Negative for chills.  HENT: Negative.   Respiratory: Negative.   Cardiovascular: Negative.   Gastrointestinal: Positive for nausea, abdominal pain, diarrhea and abdominal distention. Negative for vomiting and blood in stool.  Genitourinary: Positive for pelvic pain. Negative for dysuria, hematuria, vaginal bleeding, vaginal discharge, vaginal pain and menstrual problem.  Skin: Positive for rash.       Objective:   Physical Exam  Constitutional: She is oriented to person, place, and time. She appears well-developed and well-nourished. No distress.  Cardiovascular: Normal rate and regular rhythm.   No murmur heard. Pulmonary/Chest: Effort normal and breath  sounds normal. She has no wheezes.  Abdominal: Soft. Bowel sounds are normal. She exhibits no distension and no mass. There is no tenderness. There is no guarding.  Neurological: She is alert and oriented to person, place, and time.          Assessment & Plan:

## 2010-12-17 NOTE — Patient Instructions (Addendum)
We will schedule an ultrasound for you.  Lactose Intolerance, General Lactose intolerance is the inability to digest enough lactose. That is the sugar in milk. This inability results from a shortage of the enzyme lactase. This enzyme is normally made by the cells that line the small intestine. Lactase breaks down milk sugar into simpler forms. These can then be absorbed into the bloodstream. When there is not enough lactase to digest the amount of lactose consumed, the result may cause discomfort. While not all people low in lactase have symptoms, those who do are considered to be lactose intolerant. SYMPTOMS  Common symptoms include nausea, cramps, bloating, gas and diarrhea. These symptoms begin about 30 minutes to 2 hours after eating or drinking foods that contain lactose. How bad the symptoms get depends on the amount of lactose each person can tolerate. CAUSES Certain digestive diseases and injuries to the small intestine can reduce the amount of enzymes produced. In rare cases, children are born without the ability to make lactase. For most people, though, lactase deficiency is a condition that develops naturally over time. After about the age of 2 years, the body begins to produce less lactase. But many people may not experience symptoms (problems) until they are much older. WHO IS AT RISK FOR LACTOSE INTOLERANCE? Certain ethnic and racial populations are more widely affected than others. As many as 75 percent of all African Americans and American Indians and 90 percent of Asian Americans are lactose intolerant. The condition is least common among persons of northern European descent.  TREATMENT  Lactose intolerance is fairly easy to treat. No treatment can improve the body's ability to produce lactase. But symptoms can be controlled through diet.   Young children with lactase deficiency should not eat any foods that contain lactose. Older children and adults do not need to avoid lactose  completely. But people differ in the amounts and types of foods they can handle. For example, one person may have symptoms after drinking a small glass of milk, while another can drink one glass but not two. Others may be able to manage ice cream and aged cheeses, such as cheddar and Swiss, but not other dairy products. Dietary control of lactose intolerance depends on people learning through trial and error how much lactose they can handle.   For those who react to very small amounts of lactose or have trouble limiting their intake of foods that contain it, lactase enzymes are available without a prescription. These help people digest foods that contain lactose. The tablets are taken with the first bite of dairy food. Lactase enzyme is also available as a liquid. Adding a few drops of the enzyme will convert the lactose in milk or cream. This makes it more digestible for people with lactose intolerance.   Lactose-reduced milk and other products are available at most supermarkets. The milk contains all of the nutrients found in regular milk. It also remains fresh for about the same length of time, or longer if it is super-pasteurized.   HOW IS NUTRITION BALANCED? Milk and other dairy products are a major source of nutrients. The most important of these nutrients is calcium. Calcium is essential for the growth and repair of bones throughout life. In the middle and later years, a shortage of calcium may lead to thin, fragile bones that break easily. This condition is called osteoporosis. A concern for both children and adults with lactose intolerance is getting enough calcium in a diet that includes little or no milk.  In planning meals, making sure that each day's diet includes enough calcium is important, even if the diet does not contain dairy products. Many non-dairy foods are high in calcium. Green vegetables, such as broccoli and kale, and fish with soft, edible bones, such as salmon and sardines, are  excellent sources of calcium. To help in planning a high-calcium and low-lactose diet, the table that follows lists some common foods that are good sources of dietary calcium and shows how much lactose they contain. Recent research shows that yogurt with active cultures may be a good source of calcium for many people with lactose intolerance, even though it is fairly high in lactose. Evidence shows that the bacterial cultures that are used to make yogurt produce some of the lactase enzyme required for proper digestion of lactose. CALCIUM AND LACTOSE IN COMMON FOODS Vegetables Calcium-fortified orange juice, 1 cup Sardines, with edible bones, 3 oz. Salmon,canned,with edible bones, 3 oz. Soymilk, fortified, 1 cup Broccoli (raw), 1 cup Orange, 1 medium Pinto beans, 1/2 cup Tuna, canned, 3 oz. Lettuce greens, 1/2 cup Calcium Content 308-344 mg 270 mg 205 mg 200 mg 90 mg 50 mg 40 mg 10 mg 10 mg Lactose  Content 0 0 0 0 0 0 0 0  Dairy Products Yogurt, plain, low-fat, 1 cup Milk, reduced fat, 1 cup Swiss cheese, 1 oz. Ice cream, 1/2 cup Cottage cheese, 1/2 cup 415 mg 295 mg 270 mg 85 mg 75 mg 5 g 11g 1g 6g 2-3g  Some people with lactose intolerance may think they are not getting enough calcium and vitamin D in their diet. Talking with a caregiver or dietitian may be helpful in deciding whether any dietary supplements are needed. Taking vitamins or minerals of the wrong kind or in the wrong amounts can be harmful. A dietitian can help in planning meals that will provide the most nutrients with the least chance of causing discomfort. WHAT IS HIDDEN LACTOSE? Although milk and foods made from milk are the only natural sources, lactose is often added to prepared foods. People with very low tolerance for lactose should know about the many food products that may contain even small amounts of lactose, such as:  Bread and other baked goods.  Processed breakfast cereals.   Instant  potatoes and soups.   Powdered meal-replacement.   Margarine.  Lunch meats (non-kosher).   Candies and other snacks.   Supplements.   Pancake, biscuits and cookie mixes.  Breakfast cereals.   Salad dressings.   Some products labeled non-dairy, such as powdered coffee creamer and whipped toppings may also include ingredients that are derived from milk and therefore contain lactose. Smart shoppers learn to read food labels with care, looking not only for milk and lactose among the contents, but also for such words as whey, curds, milk by-products, dry milk solids, and nonfat dry milk powder. If any of these are listed on a label, the product contains lactose. In addition, lactose is used as the base for more than 20 percent of prescription drugs and about 6 percent of over-the-counter medicines. Many types of birth control pills, for example, contain lactose, as do some tablets for stomach acid and gas. But these products typically affect only people with severe lactose intolerance. SUMMARY Even though lactose intolerance is widespread, it need not be a serious threat to good health. People who have trouble digesting lactose can learn which dairy products and other foods they can eat without discomfort and which ones they should avoid. Many  will be able to enjoy milk, ice cream, and other such products if they take them in small amounts or eat other food at the same time. Others can use lactase liquid or tablets to help digest the lactose. Even older women at risk for osteoporosis and growing children who must avoid milk and foods made with milk can meet most of their special dietary needs by eating greens, fish, and other calcium-rich foods that are free of lactose. A carefully chosen diet, with calcium supplements if the caregiver or dietitian recommends them, is the key to reducing symptoms and protecting future health. Document Released: 08/25/2005 Document Re-Released: 02/12/2010 [facility]  Patient Information 2011 [place], [place].

## 2010-12-18 ENCOUNTER — Other Ambulatory Visit: Payer: Self-pay | Admitting: Family Medicine

## 2010-12-18 ENCOUNTER — Telehealth: Payer: Self-pay | Admitting: *Deleted

## 2010-12-18 DIAGNOSIS — R102 Pelvic and perineal pain: Secondary | ICD-10-CM

## 2010-12-18 LAB — CBC WITH DIFFERENTIAL/PLATELET
Basophils Absolute: 0.1 10*3/uL (ref 0.0–0.1)
Basophils Relative: 1 % (ref 0–1)
Eosinophils Absolute: 0.1 10*3/uL (ref 0.0–0.7)
Eosinophils Relative: 1 % (ref 0–5)
HCT: 41.9 % (ref 36.0–46.0)
Hemoglobin: 14 g/dL (ref 12.0–15.0)
Lymphocytes Relative: 42 % (ref 12–46)
Lymphs Abs: 3 10*3/uL (ref 0.7–4.0)
MCH: 32 pg (ref 26.0–34.0)
MCHC: 33.4 g/dL (ref 30.0–36.0)
MCV: 95.7 fL (ref 78.0–100.0)
Monocytes Absolute: 0.5 10*3/uL (ref 0.1–1.0)
Monocytes Relative: 7 % (ref 3–12)
Neutro Abs: 3.5 10*3/uL (ref 1.7–7.7)
Neutrophils Relative %: 49 % (ref 43–77)
Platelets: 336 10*3/uL (ref 150–400)
RBC: 4.38 MIL/uL (ref 3.87–5.11)
RDW: 12.9 % (ref 11.5–15.5)
WBC: 7.1 10*3/uL (ref 4.0–10.5)

## 2010-12-18 NOTE — Telephone Encounter (Signed)
Called and notified patient of [place] appointment with [facility] on 12/20/10 at 1:45. Patient was instructed to drink 32 oz of water prior to exam.Busick, Rodena Medin

## 2010-12-20 ENCOUNTER — Telehealth: Payer: Self-pay | Admitting: Family Medicine

## 2010-12-20 ENCOUNTER — Other Ambulatory Visit: Payer: Medicare Other

## 2010-12-20 NOTE — Telephone Encounter (Signed)
Called Re lab results: 1) cbc: wnl 2) cmet: wnl 3) lipase: wnl

## 2011-01-20 ENCOUNTER — Encounter: Payer: Self-pay | Admitting: Family Medicine

## 2011-01-24 NOTE — Discharge Summary (Signed)
NAMEDERICA, LEIBER               ACCOUNT NO.:  [id number]   MEDICAL RECORD NO.:  [id number]          PATIENT TYPE:  IPS   LOCATION:  0303                          FACILITY:  [facility]   PHYSICIAN:  Jeanice Lim, M.D. DATE OF BIRTH:  1941/03/26   DATE OF ADMISSION:  06/30/2005  DATE OF DISCHARGE:  07/04/2005                                 DISCHARGE SUMMARY   IDENTIFYING DATA:  This is a 70 year old divorced Caucasian female  voluntarily admitted. Drank alcohol yesterday in the evening, took an  unknown amount of Xanax and admitted it was a suicide attempt, decreasing  her Trilafon and eliminating noon dose. Having a labile mood, intrusive  thoughts, irritability, sadness, unclear really what had been happening with  her mood. Family reported that she had been having significant mood problems  and behavioral changes and impulsivity for at least the last six months.   MEDICATIONS:  Valtrex and Trilafon.   ALLERGIES:  No known drug allergies.   PHYSICAL AND NEUROLOGICAL EXAMINATION:  Within normal limits.   ROUTINE ADMISSION LABORATORY DATA:  Within normal limits.   MENTAL STATUS EXAM:  Slightly drowsy with unsteady gait. Irritable affect.  Speech has decreased amount but fluent. Mood depressed, irritable, agitated,  with limited insight regarding what had been happening with her mood. Denied  awareness of impulse behaviors that family reported and admitted to vague  passive suicidal ideation. Cognition was grossly intact.   ADMISSION DIAGNOSES:  AXIS I:  Bipolar disorder, type 1, mixed state with  possible psychotic features.  AXIS II:  None.  AXIS III:  1.  Status post right foot fracture.  2.  Benzodiazepine overdose.  3.  Hypokalemia.  AXIS IV:  Moderate stressors. Limited support system and possible unstable  relationships.  AXIS V:  36/65.   HOSPITAL COURSE:  The patient was admitted and ordered routine p.r.n.  medications and underwent further monitoring. Encouraged  to participate in  individual, group, and milieu therapy. Fluids were forced. The patient was  placed on followup precautions. Lamictal was started targeting instability  with depressed component. Trilafon was reoptimized to the previous dose. The  patient had been relatively stable. The patient had chronically been on  Xanax, Restoril, Effexor, and then reported a sudden drop in mood on Sunday  with no trigger, overdosed, and the patient was explained risk/benefit ratio  and alternative treatments regarding medications and treatment guidelines.  Recommendations regarding stabilization of bipolar disorder, and patient was  optimized on medications. The patient reported gradual improvement in mood.  Thought processes became more clear. The patient still denied awareness of  bizarre behaviors family was reporting, but was aware of what family was  misconstruing, and thus, this was clarified in the family session. The  patient was optimized on medications. Reported markedly improved. Felt much  more stable and in control, better than she had in years. Sleep and appetite  improved. The patient was appropriate in the unit. The patient was  discharged improved condition with on psychotic symptoms. No mood  instability. Improved judgment and insight. Given medication education and  discharged on:  1.  Ambien 10 mg nightly.  2.  Motrin 600 mg q.12h. p.r.n. pain.  3.  Effexor XR 150 mg q.a.m.  4.  Trilafon 4 mg three at 8 p.m.  5.  Restoril 15 mg 3 at 9 p.m.  6.  Depakote ER 500 mg 1 at 8 p.m.   FOLLOW UP:  With Dr. Electa Sniff on October 31. The patient was also follow up  with Dr. Leeanne Deed on October 31 at 2:15, Leeanne Mannan, therapist Monday  November 6 at 10 a.m.   DISCHARGE DIAGNOSES:  AXIS I:  Bipolar disorder, type 1, mixed state with  possible psychotic features.  AXIS II:  None.  AXIS III:  1.  Status post right foot fracture.  2.  Benzodiazepine overdose.  3.  Hypokalemia.  AXIS IV:   Moderate stressors. Limited support system and possible unstable  relationships.  AXIS V:  55.      Jeanice Lim, M.D.  Electronically Signed     JEM/MEDQ  D:  07/16/2005  T:  07/17/2005  Job:  12

## 2011-01-31 ENCOUNTER — Ambulatory Visit (INDEPENDENT_AMBULATORY_CARE_PROVIDER_SITE_OTHER): Payer: Medicare Other | Admitting: Family Medicine

## 2011-01-31 ENCOUNTER — Telehealth: Payer: Self-pay | Admitting: Family Medicine

## 2011-01-31 ENCOUNTER — Encounter: Payer: Self-pay | Admitting: Family Medicine

## 2011-01-31 VITALS — BP 130/78 | HR 84 | Wt 157.0 lb

## 2011-01-31 DIAGNOSIS — A6000 Herpesviral infection of urogenital system, unspecified: Secondary | ICD-10-CM

## 2011-01-31 MED ORDER — GANCICLOVIR 0.15 % OP GEL: 1.0000 | Freq: Every day | OPHTHALMIC | Status: DC

## 2011-01-31 MED ORDER — HYDROCODONE-ACETAMINOPHEN 5-325 MG PO TABS: 1.0000 | ORAL_TABLET | Freq: Four times a day (QID) | ORAL | Status: DC | PRN

## 2011-01-31 MED ORDER — HYDROXYZINE HCL 10 MG PO TABS: 10.0000 mg | ORAL_TABLET | Freq: Three times a day (TID) | ORAL | Status: AC | PRN

## 2011-01-31 NOTE — Telephone Encounter (Signed)
Called [facility]. They have found the med.

## 2011-01-31 NOTE — Telephone Encounter (Signed)
They cannot find the eye gel that was prescribed for pt would like to know if you would give something different?

## 2011-01-31 NOTE — Assessment & Plan Note (Signed)
Herpes flare that has spread to rest of body. Pt is having eye complaints (burning, itching), although exam did not show conjunctivitis.  I called [facility] and they will see pt today.  I will Rx Zirgan for her to use for eyes. For pruritis and tenderness I will Rx atarax and hydrocodone. I am concerned with the frequency of her flare ups.  CBC with diff from 12/2010 was wnl with normal WBC.  I have not checked HIV or RPR.  I would like to refer pt to Infectious Disease clinic.  She is agreeable to plan.

## 2011-01-31 NOTE — Progress Notes (Signed)
  Subjective:    Patient ID: Janice Ramos, female    DOB: [birth year], 70 y.o.   MRN: [id number]  HPI Herpes Flare: Pt believes that she is having another herpes flare x 5 days.   Started in genital area, then it spread to back, where she had burning and itching pain.  She then developed rash on her arms. She is having burning sensation on legs. Her hips are also hurting. She has been taking Zovirax 800mg  daily for suppression and 5 days ago started taking it 5x/day for the flare.   She is developing burning/tingling/itching inside both eyes.  The last time this happened she was Rx Zirgan by Elwyn Reach, Feb 2012.  Marland Kitchen  Pt is very irritated because rash is pruritic and painful.  She cannot get comfortable.   Review of Systems +feverish feeling, no chills, no nause/vomiting, +fatigue    Objective:   Physical Exam GEN: mild distress Vision grossly intact with distant (6 ft) and near (14 inches) vision. EYE: clear conjunctiva, eomi, perrla, no papilledema. On R eye, in supraorbital area there is one vesicular lesion.  Face: multiple splothy lesions that are red/pink on face, no vesicle, no pustules, some crusting.  Arms: multiple pinkish/red vesicles, some papular lesions, dry skin Neck: No cervical lesions.       Assessment & Plan:

## 2011-02-04 ENCOUNTER — Other Ambulatory Visit: Payer: Self-pay | Admitting: Family Medicine

## 2011-02-04 NOTE — Telephone Encounter (Signed)
Refill request

## 2011-02-11 ENCOUNTER — Ambulatory Visit (INDEPENDENT_AMBULATORY_CARE_PROVIDER_SITE_OTHER): Payer: Medicare Other | Admitting: Home Health Services

## 2011-02-11 ENCOUNTER — Encounter: Payer: Self-pay | Admitting: Home Health Services

## 2011-02-11 VITALS — BP 136/86 | HR 81 | Temp 97.3°F | Ht 63.0 in | Wt 157.4 lb

## 2011-02-11 DIAGNOSIS — Z0000 Encounter for general adult medical examination without abnormal findings: Secondary | ICD-10-CM

## 2011-02-11 NOTE — Progress Notes (Signed)
I have reviewed this visit and discussed with Suzanne Lineberry and agree with her documentation  

## 2011-02-11 NOTE — Patient Instructions (Signed)
1. Continue to focus on eating healthy foods such as vegetables. 2. Start working on reaching goal weight of 145 lbs by June 2013.  3. Consider scheduling a colonoscopy.  4. Complete living will and bring a copy to Dr.

## 2011-02-11 NOTE — Progress Notes (Signed)
Patient here for annual wellness visit, patient reports: Risk Factors/Conditions needing evaluation or treatment: Patient does not have any risk factors that need evalution. Home Safety: Patient lives by self in 2 story home.  Patient reports having smoke detectors and adaptive equipment in bathroom. Other Information: Corrective lens: Patient wears corrective lens for reading and visits eye doctor annually. Dentures: Patient does not have dentures and visits dentist annually. Memory: Patient reports some memory loss. Patient's Mini Mental Score (recorded in doc. flowsheet): 30  Balance/Gait: Patient had no noticeable balance/gait problems Balance Abnormal Patient value  Sitting balance    Sit to stand    Attempts to arise    Immediate standing balance    Standing balance    Nudge    Eyes closed- Romberg    Tandem stance  difficulty  Back lean    Neck Rotation    360 degree turn    Sitting down     Gait Abnormal Patient value  Initiation of gait    Step length-left    Step length-right    Step height-left    Step height-right    Step symmetry    Step continuity    Path deviation    Trunk movement    Walking stance        Annual Wellness Visit Requirements Recorded Today In  Medical, family, social history Past Medical, Family, Social History Section  Current providers Care team  Current medications Medications  Wt, BP, Ht, BMI Vital signs  Hearing assessment (welcome visit) declined  Tobacco, alcohol, illicit drug use History  ADL Nurse Assessment  Depression Screening Nurse Assessment  Cognitive impairment Nurse Assessment  Mini Mental Status Document Flowsheet  Fall Risk Nurse Assessment  Home Safety Progress Note  End of Life Planning (welcome visit) Social Documentation  Medicare preventative services Progress Note  Risk factors/conditions needing evaluation/treatment Progress Note  Personalized health advice Patient Instructions, goals, letter  Diet &  Exercise Social Documentation  Emergency Contact Social Documentation  Seat Belts Social Documentation  Sun exposure/protection Social Documentation    Prevention Plan: Patient declined a majority of the screenings.   Recommended Medicare Prevention Screenings Women over 59 Test For Frequency Date of Last- BOLD if needed  Breast Cancer 1-2 yrs 8/09  Cervical Cancer 1-3 yrs Not indicated  Colorectal Cancer 1-10 yrs Will consider  Osteoporosis once declined  Cholesterol 5 yrs 10/09  Diabetes yearly 9/11  HIV yearly declined  Influenza Shot yearly declined  Pneumonia Shot once declined  Zostavax Shot once declined    .

## 2011-02-24 ENCOUNTER — Encounter: Payer: Self-pay | Admitting: Family Medicine

## 2011-03-21 ENCOUNTER — Encounter: Payer: Self-pay | Admitting: Family Medicine

## 2011-03-21 ENCOUNTER — Ambulatory Visit (INDEPENDENT_AMBULATORY_CARE_PROVIDER_SITE_OTHER): Payer: Medicare Other | Admitting: Family Medicine

## 2011-03-21 VITALS — BP 140/86 | HR 88 | Temp 98.2°F | Wt 156.8 lb

## 2011-03-21 DIAGNOSIS — R634 Abnormal weight loss: Secondary | ICD-10-CM

## 2011-03-21 DIAGNOSIS — F329 Major depressive disorder, single episode, unspecified: Secondary | ICD-10-CM

## 2011-03-21 DIAGNOSIS — A6000 Herpesviral infection of urogenital system, unspecified: Secondary | ICD-10-CM

## 2011-03-21 DIAGNOSIS — R5383 Other fatigue: Secondary | ICD-10-CM

## 2011-03-21 LAB — TSH: TSH: 3.026 u[IU]/mL (ref 0.350–4.500)

## 2011-03-21 LAB — HIV ANTIBODY (ROUTINE TESTING W REFLEX): HIV: NONREACTIVE

## 2011-03-21 LAB — BASIC METABOLIC PANEL
BUN: 23 mg/dL (ref 6–23)
Calcium: 10.1 mg/dL (ref 8.4–10.5)
Chloride: 100 mEq/L (ref 96–112)
Creat: 0.72 mg/dL (ref 0.50–1.10)

## 2011-03-21 NOTE — Progress Notes (Signed)
  Subjective:    Patient ID: Janice Ramos, female    DOB: [birth year], 70 y.o.   MRN: [id number]  HPI Herpes outbreaks multiple- X 6 months has had genital and oral herpes outbeak constantly.  Since 1983 has had herpes.    Fatigue: Seems to occur with the herpes outbreaks.  So much worse in past 6 months.  Poor energy level.  Would like thyroid checked.  Weight loss: Decrease appetite x 15 years.  Husband left her 15 years ago.  Recent mammogram, no dark or bloody stools  Depression screen: Sleeps well, "I don't have hobbies",  + feelings of guilt, poor concentration, appetite decreased, some depressed feelings.  Little support system.    PMH: Per pt recent diagnosis of autoimmune disorder- by eye physician   Review of Systems + hot flashes. decreased appetite. Some decrease in weight.  Occasional rash around eyes and all over body.      Objective:   Physical Exam  Constitutional: She is oriented to person, place, and time. She appears well-developed and well-nourished.  HENT:       No labial herpes outbreak present.  3-4 scattered ulcers on mucous membranes of sides of mouth.  Neck: Neck supple. No thyromegaly present.  Cardiovascular: Normal rate and regular rhythm.   No murmur heard. Pulmonary/Chest: Effort normal and breath sounds normal. No respiratory distress.  Abdominal: Soft. She exhibits no distension. There is no tenderness.  Genitourinary:       Pt refuses GU exam- states she doesn't have a current outbreak of herpes  Musculoskeletal: Normal range of motion. She exhibits no edema.  Neurological: She is alert and oriented to person, place, and time.  Skin: No rash noted.      PHQ9 score= 13    Assessment & Plan:

## 2011-03-21 NOTE — Patient Instructions (Addendum)
For depression: Continue to follow up appointment with psychiatric For fatigue: We will check your thyroid and electrolytes

## 2011-03-25 NOTE — Assessment & Plan Note (Addendum)
Pt has been seen by pcp for this issue in past. Did not see   Will have pt's new pcp f/up on this issue.

## 2011-03-26 ENCOUNTER — Encounter: Payer: Self-pay | Admitting: Family Medicine

## 2011-03-26 DIAGNOSIS — R634 Abnormal weight loss: Secondary | ICD-10-CM | POA: Insufficient documentation

## 2011-03-26 DIAGNOSIS — R5383 Other fatigue: Secondary | ICD-10-CM | POA: Insufficient documentation

## 2011-03-26 NOTE — Assessment & Plan Note (Signed)
Will defer this issue to pcp.  May be 2/2 to depression.

## 2011-03-26 NOTE — Assessment & Plan Note (Signed)
BMET, CBC, HIV and TSH ordered for eval of fatigue. Most likely 2/2 depression.

## 2011-03-26 NOTE — Assessment & Plan Note (Signed)
Pt to f/up with psychiatrist and pcp on this issue.

## 2011-04-03 ENCOUNTER — Ambulatory Visit: Payer: Medicare Other | Admitting: Family Medicine

## 2011-04-28 ENCOUNTER — Ambulatory Visit: Payer: Medicare Other

## 2011-05-20 ENCOUNTER — Encounter: Payer: Self-pay | Admitting: Family Medicine

## 2011-05-20 ENCOUNTER — Ambulatory Visit (INDEPENDENT_AMBULATORY_CARE_PROVIDER_SITE_OTHER): Payer: Medicare Other | Admitting: Family Medicine

## 2011-05-20 VITALS — BP 130/80 | HR 72 | Temp 98.2°F | Wt 158.7 lb

## 2011-05-20 DIAGNOSIS — R21 Rash and other nonspecific skin eruption: Secondary | ICD-10-CM

## 2011-05-20 DIAGNOSIS — E119 Type 2 diabetes mellitus without complications: Secondary | ICD-10-CM

## 2011-05-20 DIAGNOSIS — A6000 Herpesviral infection of urogenital system, unspecified: Secondary | ICD-10-CM

## 2011-05-20 NOTE — Patient Instructions (Addendum)
For rash on right shin: Use antifungal cream 2 x day for 2 weeks.  Return if not improved.  For herpes outbreaks: Since you report frequent outbreaks and that the acyclovir isn't helping, will refer you to the ID doctors to see if they have a better suppression regimen.   I will send in the needed refills to [facility].  I will mail your blood results to you.  Return to see me after your appointment with infectious disease.

## 2011-05-21 ENCOUNTER — Other Ambulatory Visit: Payer: Self-pay | Admitting: Family Medicine

## 2011-05-21 DIAGNOSIS — I10 Essential (primary) hypertension: Secondary | ICD-10-CM

## 2011-05-21 NOTE — Telephone Encounter (Signed)
Refill request

## 2011-05-23 ENCOUNTER — Other Ambulatory Visit: Payer: Self-pay | Admitting: Family Medicine

## 2011-05-23 MED ORDER — HYDROCODONE-ACETAMINOPHEN 5-325 MG PO TABS: 1.0000 | ORAL_TABLET | Freq: Four times a day (QID) | ORAL | Status: DC | PRN

## 2011-05-24 DIAGNOSIS — R21 Rash and other nonspecific skin eruption: Secondary | ICD-10-CM | POA: Insufficient documentation

## 2011-05-24 NOTE — Assessment & Plan Note (Signed)
Rash has fungal appearance. Will use over the counter antifungal cream 2 times a day for 2 weeks. If no improvement in rash patient to return to care.

## 2011-05-24 NOTE — Progress Notes (Signed)
  Subjective:    Patient ID: Janice Ramos, female    DOB: March 31, 1941, 70 y.o.   MRN: [id number]  HPI 1-patient states she is here for refill of meds.  2-recurrent herpes outbreaks: Patient states that she has general herpes outbreak approximately 3 weeks out of each month. Outbreak clears only a few days before another outbreak occurs. Patient has been on chronic suppression therapy with acyclovir. She currently takes 100 mg acyclovir daily for prevention and will increase that to 1 tablet 5 times daily for outbreak. Patient also endorses oral herpes outbreak this severe approximately 2 times per year. Also endorses worse but occur in her mouth approximately 2 times per month. Patient here requesting if there is any other medication that can be used for better suppression. Patient has been seen in clinic for this issue multiple times. Patient states that today's appointment does not have an active outbreak of genital herpes.  3-rash right shin: Patient reports circular rash on her right shin for approximately 6 weeks or longer. Rash is red in color, raised, does not itch.    Review of Systems    as per above. No fever. No chest pain. No sob. Objective:   Physical Exam  Constitutional: She is oriented to person, place, and time. She appears well-developed and well-nourished.  HENT:  Head: Normocephalic and atraumatic.       No lesions inside mouth on mucous membranes.  4 small healing lesions with scabbing present scattered near left side of mouth and chin.  No drainage.   Cardiovascular: Normal rate, regular rhythm and normal heart sounds.   No murmur heard. Pulmonary/Chest: Effort normal. No respiratory distress.  Genitourinary:       Pt refuses genital exam- states she doesn't currently have an outbreak.   Musculoskeletal: She exhibits no edema.  Neurological: She is alert and oriented to person, place, and time.  Skin:       Right shin rash: Quarter in size, erythematous, raised  edges with some central clearing, some dry skin present within lesion.   Psychiatric: She has a normal mood and affect.       Thoughts tangential.           Assessment & Plan:

## 2011-05-24 NOTE — Assessment & Plan Note (Signed)
Although not seen on exam, patient endorses history of recurrent genital herpes since 1983. Even with suppressive therapy with acyclovir patient endorses 3 weeks of genital herpes lesions per month. Dr. Janalyn Harder have discussed the possible referral to infectious disease clinic for input regarding better suppression agent at her visit in May 2012. Patient states that this was never arranged. I will refer to infectious disease clinic at this time for their input.

## 2011-05-26 ENCOUNTER — Other Ambulatory Visit: Payer: Self-pay | Admitting: Family Medicine

## 2011-05-26 ENCOUNTER — Telehealth: Payer: Self-pay | Admitting: *Deleted

## 2011-05-26 ENCOUNTER — Telehealth: Payer: Self-pay | Admitting: Family Medicine

## 2011-05-26 NOTE — Telephone Encounter (Signed)
Pt states that the pharmacy has not gotten script for her Norco.  Needs it BADLY! pls let pt know

## 2011-05-26 NOTE — Telephone Encounter (Signed)
fwd

## 2011-05-26 NOTE — Telephone Encounter (Signed)
Faxed referral to the [facility]. Pt informed of it. Lorenda Hatchet, Renato Battles

## 2011-05-26 NOTE — Telephone Encounter (Signed)
I called the pharmacy- [facility]- the received the rx on Friday when I called it in.  Ready for pick up.

## 2011-05-29 ENCOUNTER — Ambulatory Visit (INDEPENDENT_AMBULATORY_CARE_PROVIDER_SITE_OTHER): Payer: Medicare Other | Admitting: Family Medicine

## 2011-05-29 ENCOUNTER — Encounter: Payer: Self-pay | Admitting: Family Medicine

## 2011-05-29 VITALS — BP 128/80 | HR 80 | Temp 98.0°F | Wt 158.0 lb

## 2011-05-29 DIAGNOSIS — N760 Acute vaginitis: Secondary | ICD-10-CM

## 2011-05-29 DIAGNOSIS — R300 Dysuria: Secondary | ICD-10-CM | POA: Insufficient documentation

## 2011-05-29 LAB — DIFFERENTIAL
Eosinophils Absolute: 0.1
Lymphocytes Relative: 17
Lymphs Abs: 2.4
Monocytes Relative: 1 — ABNORMAL LOW
Neutrophils Relative %: 81 — ABNORMAL HIGH

## 2011-05-29 LAB — POCT WET PREP (WET MOUNT)
Clue Cells Wet Prep HPF POC: NEGATIVE
WBC, Wet Prep HPF POC: >20
Yeast Wet Prep HPF POC: NEGATIVE

## 2011-05-29 LAB — I-STAT 8, (EC8 V) (CONVERTED LAB)
Acid-Base Excess: 3 — ABNORMAL HIGH
Glucose, Bld: 112 — ABNORMAL HIGH
Hemoglobin: 17.3 — ABNORMAL HIGH
Potassium: 8.8
Sodium: 132 — ABNORMAL LOW
TCO2: 32

## 2011-05-29 LAB — POCT URINALYSIS DIPSTICK
Nitrite, UA: NEGATIVE
Protein, UA: NEGATIVE
Urobilinogen, UA: 0.2
pH, UA: 6.5

## 2011-05-29 LAB — CBC
HCT: 46.9 — ABNORMAL HIGH
MCV: 95.4
RBC: 4.92
WBC: 14.2 — ABNORMAL HIGH

## 2011-05-29 LAB — HEPATIC FUNCTION PANEL
AST: 31
Bilirubin, Direct: 0.3

## 2011-05-29 LAB — POCT I-STAT CREATININE: Creatinine, Ser: 1.1

## 2011-05-29 MED ORDER — CEPHALEXIN 500 MG PO CAPS: 500.0000 mg | ORAL_CAPSULE | Freq: Two times a day (BID) | ORAL | Status: DC

## 2011-05-29 MED ORDER — FLUCONAZOLE 150 MG PO TABS: 150.0000 mg | ORAL_TABLET | Freq: Once | ORAL | Status: DC

## 2011-05-29 MED ORDER — FLUCONAZOLE 150 MG PO TABS: 150.0000 mg | ORAL_TABLET | Freq: Once | ORAL | Status: AC

## 2011-05-29 MED ORDER — CEPHALEXIN 500 MG PO CAPS: 500.0000 mg | ORAL_CAPSULE | Freq: Two times a day (BID) | ORAL | Status: AC

## 2011-05-29 NOTE — Patient Instructions (Addendum)
I think you have a yeast infection. Take the diflucan tablet x1.  You may also have a urine infection. If your symptoms do not improve 2 days after taking the diflucan, take the antibiotic.  If your symptoms do not improve over the next week or become worse, please return to the clinic. Otherwise, just keep your next regular appointment.

## 2011-05-29 NOTE — Progress Notes (Signed)
  Subjective:    Patient ID: Janice Ramos, female    DOB: Mar 29, 1941, 70 y.o.   MRN: [id number]  HPI Pelvic irritation/burning starting yesterday. Also c/o dysuria and possible chills. Although denies fevers, back pain, nausea/vomiting, urinary frequency/urgency.  Concerned may have yeast infection and herpes flare.  Did have vesicular outbreak around lips last week. Now resolved.   Review of Systems Per HPI    Objective:   Physical Exam Gen: NAD Psych: very anxious-appearing CV: RRR Face: no vesicles GU: significant pain with palpating perineum/vulva/vagina   Perineum: normal-appearing with no erythema, vesicles, or other lesions   Vulva: normal-appearing with no erythema, vesicles or other lesions   Vagina: erythematous vaginal walls and significant TTP during speculum insertion; small amount of thin whitish discharge in posterior vault   Cervix: normal-appearing, pink    Assessment & Plan:

## 2011-06-04 ENCOUNTER — Ambulatory Visit: Payer: Medicare Other | Admitting: Infectious Disease

## 2011-06-18 ENCOUNTER — Ambulatory Visit (INDEPENDENT_AMBULATORY_CARE_PROVIDER_SITE_OTHER): Payer: Medicare Other | Admitting: Infectious Disease

## 2011-06-18 ENCOUNTER — Encounter: Payer: Self-pay | Admitting: Infectious Disease

## 2011-06-18 VITALS — BP 136/87 | HR 71 | Temp 97.5°F | Ht 62.0 in | Wt 156.0 lb

## 2011-06-18 DIAGNOSIS — B009 Herpesviral infection, unspecified: Secondary | ICD-10-CM

## 2011-06-18 DIAGNOSIS — B999 Unspecified infectious disease: Secondary | ICD-10-CM

## 2011-06-18 DIAGNOSIS — B0052 Herpesviral keratitis: Secondary | ICD-10-CM | POA: Insufficient documentation

## 2011-06-18 DIAGNOSIS — A6000 Herpesviral infection of urogenital system, unspecified: Secondary | ICD-10-CM

## 2011-06-18 HISTORY — DX: Herpesviral keratitis: B00.52

## 2011-06-18 HISTORY — DX: Herpesviral infection, unspecified: B00.9

## 2011-06-18 MED ORDER — ACYCLOVIR 800 MG PO TABS: 800.0000 mg | ORAL_TABLET | Freq: Two times a day (BID) | ORAL | Status: DC

## 2011-06-18 NOTE — Assessment & Plan Note (Signed)
Doesn't seem to be as big of an issue but again same problem of acylclovir R could be an issue

## 2011-06-18 NOTE — Assessment & Plan Note (Signed)
I would recommend increasing the suppressive dose to 800 acyclovir twice daily. She should not need more than 800 mg tid for treatment. I dod wonder if she could have an acylovir R virus. To evaluate for this we would want to unroof one of her HSV2 vesicles and send it in viral transport media to a lab that could sequence the virus for resistance mutations. I will also test her for idiopathic cd4 lymphopenia. She teested negative for HIV she claims.

## 2011-06-18 NOTE — Progress Notes (Signed)
Subjective:    Patient ID: Janice Ramos, female    DOB: [birth year], 70 y.o.   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 She has been on various suppressive regimens including acyclovir, valtrex and famvir none have which have been sufficienty effective at suppressing her recurrent outbreaks that occur as frequently as monthly. She also does have other complaints of pain in her lower back and in her hips and legs when she does not have an actual HSV outbreak but which she still blames on this virus. I have explained to her that it is unlikely that she has any specific immune deficiency syndromm. I suggested that we could test her cd4/cd8 count in case she had idiopathic cd4 lymphopenia, though I doubt this. She claims that her PCP is working her up for an autoimmune disorder including Sjogrens. I have also suggested possibilyt that she could have developed an acyclovir Resistant HSV virus. I spent greater than an hour with this pt including greater than 50% of time in face to face counselling of the pt and in coordination of care.  Review of Systems  Constitutional: Negative for chills, diaphoresis, activity change, appetite change and unexpected weight change.  HENT: Negative for sneezing, trouble swallowing and sinus pressure.   Eyes: Negative for photophobia and visual disturbance.  Respiratory: Negative for chest tightness, wheezing and stridor.   Cardiovascular: Negative for chest pain, palpitations and leg swelling.  Gastrointestinal: Negative for nausea, abdominal pain, constipation, blood in stool, abdominal distention and anal bleeding.  Genitourinary: Negative for dysuria, hematuria, flank pain and difficulty urinating.  Musculoskeletal: Negative for myalgias, back pain, joint swelling, arthralgias and gait problem.  Skin: Negative for color change, pallor  and wound.  Neurological: Negative for dizziness, tremors, weakness and light-headedness.  Hematological: Negative for adenopathy. Does not bruise/bleed easily.  Psychiatric/Behavioral: Negative for behavioral problems, confusion, sleep disturbance, dysphoric mood, decreased concentration and agitation.       Objective:   Physical Exam  Constitutional: She is oriented to person, place, and time. She appears well-developed and well-nourished. No distress.  HENT:  Head: Normocephalic and atraumatic.  Mouth/Throat: Oropharynx is clear and moist. No oropharyngeal exudate.  Eyes: Conjunctivae and EOM are normal. Pupils are equal, round, and reactive to light. No scleral icterus.  Neck: Normal range of motion. Neck supple. No JVD present.  Cardiovascular: Normal rate, regular rhythm and normal heart sounds.  Exam reveals no gallop and no friction rub.   No murmur heard. Pulmonary/Chest: Effort normal and breath sounds normal. No respiratory distress. She has no wheezes. She has no rales. She exhibits no tenderness.  Abdominal: She exhibits no distension and no mass. There is no tenderness. There is no rebound and no guarding.  Musculoskeletal: She exhibits no edema and no tenderness.  Lymphadenopathy:    She has no cervical adenopathy.  Neurological: She is alert and oriented to person, place, and time. She has normal reflexes. She exhibits normal muscle tone. Coordination normal.  Skin: Skin is warm and dry. No rash noted. She is not diaphoretic. No erythema. No pallor.  Psychiatric: She has a normal mood and affect. Her behavior is normal. Judgment and thought content normal.          Assessment & Plan:  HSV-2 (herpes simplex virus 2) infection I would recommend increasing the suppressive dose to 800 acyclovir twice daily. She should not need  more than 800 mg tid for treatment. I dod wonder if she could have an acylovir R virus. To evaluate for this we would want to unroof one of her  HSV2 vesicles and send it in viral transport media to a lab that could sequence the virus for resistance mutations. I will also test her for idiopathic cd4 lymphopenia. She teested negative for HIV she claims.  HSV-1 (herpes simplex virus 1) infection Doesn't seem to be as big of an issue but again same problem of acylclovir R could be an issue  HSV epithelial keratitis Continue current management with topical antivirals. The fact that this has responded to topical gancyclovir makes me think R is less likely

## 2011-06-18 NOTE — Assessment & Plan Note (Signed)
Continue current management with topical antivirals. The fact that this has responded to topical gancyclovir makes me think R is less likely

## 2011-06-18 NOTE — Patient Instructions (Signed)
I would recommend having a lesion unroofed and sent for HSV PCR and sequencing

## 2011-06-19 ENCOUNTER — Ambulatory Visit (INDEPENDENT_AMBULATORY_CARE_PROVIDER_SITE_OTHER): Payer: Medicare Other | Admitting: Family Medicine

## 2011-06-19 ENCOUNTER — Encounter: Payer: Self-pay | Admitting: Family Medicine

## 2011-06-19 VITALS — BP 140/92 | Temp 97.9°F | Ht 62.75 in | Wt 158.1 lb

## 2011-06-19 DIAGNOSIS — M76899 Other specified enthesopathies of unspecified lower limb, excluding foot: Secondary | ICD-10-CM

## 2011-06-19 DIAGNOSIS — M7060 Trochanteric bursitis, unspecified hip: Secondary | ICD-10-CM

## 2011-06-19 MED ORDER — IBUPROFEN 800 MG PO TABS: 800.0000 mg | ORAL_TABLET | Freq: Three times a day (TID) | ORAL | Status: AC | PRN

## 2011-06-19 NOTE — Patient Instructions (Signed)
We'll try conservative therapy first: this includes stretching, heat to the area, and anti-inflammatories. Take the Ibuprofen 800 mg every 8 hours as needed for the pain.  Use this BEFORE stretching to help with your stretching. If, after 10-14 days, you're really not experiencing any relief, we can try a cortisone shot.  Trochanteric Bursitis You have hip pain due to trochanteric bursitis. Bursitis means that the sack near the outside of the hip is filled with fluid and inflamed. This sack is made up of protective soft tissue. The pain from trochanteric bursitis can be severe and keep you from sleep. It can radiate to the buttocks or down the outside of the thigh to the knee. The pain is almost always worse when rising from the seated or lying position and with walking. Pain can improve after you take a few steps. It happens more often in people with hip joint and lumbar spine problems, such as arthritis or previous surgery. Very rarely the trochanteric bursa can become infected, and antibiotics and/or surgery may be needed. Treatment often includes an injection of local anesthetic mixed with cortisone medicine. This medicine is injected into the area where it is most tender over the hip. Repeat injections may be necessary if the response to treatment is slow. You can apply ice packs over the tender area for 30 minutes every 2 hours for the next few days. Anti-inflammatory and/or narcotic pain medicine may also be helpful. Limit your activity for the next few days if the pain continues. See your caregiver in 5-10 days if you are not greatly improved.   STRETCHING EXERCISES - Trochantic Bursitis  These exercises may help you when beginning to rehabilitate your injury. Your symptoms may resolve with or without further involvement from your physician, physical therapist or athletic trainer. While completing these exercises, remember:    Restoring tissue flexibility helps normal motion to return to the  joints. This allows healthier, less painful movement and activity.   An effective stretch should be held for at least 30 seconds.   A stretch should never be painful. You should only feel a gentle lengthening or release in the stretched tissue.  STRETCH - Iliotibial Band   On the floor or bed, lie on your side so your injured leg is on top. Bend your knee and grab your ankle.   Slowly bring your knee back so that your thigh is in line with your trunk. Keep your heel at your buttocks and gently arch your back so your head, shoulders and hips line up.   Slowly lower your leg so that your knee approaches the floor/bed until you feel a gentle stretch on the outside of your thigh. If you do not feel a stretch and your knee will not fall farther, place the heel of your opposite foot on top of your knee and pull your thigh down farther.   Hold this stretch for 30 seconds.   Repeat 3 times. Complete this exercise 2 times per day.  STRETCH - Hamstrings, Supine   Lie on your back. Loop a belt or towel over the ball of your foot as shown.   Straighten your knee and slowly pull on the belt to raise your injured leg. Do not allow the knee to bend. Keep your opposite leg flat on the floor.   Raise the leg until you feel a gentle stretch behind your knee or thigh. Hold this position for 30 seconds.   Repeat 3 times. Complete this stretch 2 times per  day.  STRETCH - Quadriceps, Prone   Lie on your stomach on a firm surface, such as a bed or padded floor.   Bend your knee and grasp your ankle. If you are unable to reach, your ankle or pant leg, use a belt around your foot to lengthen your reach.   Gently pull your heel toward your buttocks. Your knee should not slide out to the side. You should feel a stretch in the front of your thigh and/or knee.   Hold this position for 30 seconds.   Repeat 3 times. Complete this stretch 2 times per day.  STRETCHING - Hip Flexors, Lunge Half kneel with your  knee on the floor and your opposite knee bent and directly over your ankle.  Keep good posture with your head over your shoulders. Tighten your buttocks to point your tailbone downward; this will prevent your back from arching too much.   You should feel a gentle stretch in the front of your thigh and/or hip. If you do not feel any resistance, slightly slide your opposite foot forward and then slowly lunge forward so your knee once again lines up over your ankle. Be sure your tailbone remains pointed downward.   Hold this stretch for 30 seconds.   Repeat 3 times. Complete this stretch 2 times per day.  STRETCH - Adductors, Lunge  While standing, spread your legs   Lean away from your injured leg by bending your opposite knee. You may rest your hands on your thigh for balance.   You should feel a stretch in your inner thigh. Hold for 30 seconds.   Repeat 3 times. Complete this exercise 2 times per day.  SEEK IMMEDIATE MEDICAL CARE IF YOU DEVELOP:  Severe pain, fever, increased redness.   Pain that radiates below the knee.  Document Released: 10/02/2004 Document Re-Released: 06/22/2009 [facility] Patient Information 2011 [place], [place].

## 2011-06-22 DIAGNOSIS — M7060 Trochanteric bursitis, unspecified hip: Secondary | ICD-10-CM | POA: Insufficient documentation

## 2011-06-22 NOTE — Progress Notes (Signed)
  Subjective:    Patient ID: Janice Ramos, female    DOB: 05/22/1941, 70 y.o.   MRN: [id number]  HPI 1.  Left leg pain:  Located lateral aspect of Left hip.  Worse with movement, laying on that side.  Starting to affect her sleep.  Tries walking for exercise but pain is now limiting her distance.  Has tried Alleve with some relief of symptoms.  Pain radiates down left lateral aspect of leg to knee at times.  No fevers or chills.  No trauma   Review of Systems See HPI above for review of systems.       Objective:   Physical Exam Gen:  Alert, cooperative patient who appears stated age in no acute distress.  Vital signs reviewed. MSK:  TTP over Left trochanter.  Straight leg raise positive for Left trochanter pain.  Full active and passive ROM in hips and knees.  No crepitus noted.         Assessment & Plan:

## 2011-06-22 NOTE — Assessment & Plan Note (Signed)
Left side.   Conservative therapy for now - see instructions. RTC in 1 month if no improvement, sooner if worsening symptoms.

## 2011-07-16 ENCOUNTER — Encounter: Payer: Self-pay | Admitting: Family Medicine

## 2011-07-16 ENCOUNTER — Ambulatory Visit (INDEPENDENT_AMBULATORY_CARE_PROVIDER_SITE_OTHER): Payer: Medicare Other | Admitting: Family Medicine

## 2011-07-16 DIAGNOSIS — S61209A Unspecified open wound of unspecified finger without damage to nail, initial encounter: Secondary | ICD-10-CM

## 2011-07-16 DIAGNOSIS — S61219A Laceration without foreign body of unspecified finger without damage to nail, initial encounter: Secondary | ICD-10-CM

## 2011-07-16 NOTE — Assessment & Plan Note (Signed)
Healing well. Gave reassurance. To return if signs of infection.

## 2011-07-16 NOTE — Progress Notes (Signed)
  Subjective:    Patient ID: Janice Ramos, female    DOB: 04-Apr-1941, 70 y.o.   MRN: [id number]  HPI Cut on finger: Occurred 3 days ago when she was cleaning her ceramic coffee mugs. The blood handle flew off and cut the ring finger for left hand. It is still painful. No further bleeding. She's been keeping it clean with soap and water. Also been using antibiotic ointment and keeping it covered. No redness, purulence, fevers.   Review of Systems See HPI above for review of systems.       Objective:   Physical Exam  Gen:  Alert, cooperative patient who appears stated age in no acute distress.  Vital signs reviewed. Ext:  0.5 mm laceration at DIP joint of left hand fourth digit. No signs of erythema or infection. Healing well.      Assessment & Plan:

## 2011-08-02 ENCOUNTER — Encounter (HOSPITAL_COMMUNITY): Payer: Self-pay | Admitting: *Deleted

## 2011-08-02 ENCOUNTER — Emergency Department (INDEPENDENT_AMBULATORY_CARE_PROVIDER_SITE_OTHER)
Admission: EM | Admit: 2011-08-02 | Discharge: 2011-08-02 | Disposition: A | Discharge status: Home / Self Care | Payer: Medicare Other | Source: Home / Self Care | Attending: Family Medicine | Admitting: Family Medicine

## 2011-08-02 DIAGNOSIS — N390 Urinary tract infection, site not specified: Secondary | ICD-10-CM

## 2011-08-02 HISTORY — DX: Irritable bowel syndrome, unspecified: K58.9

## 2011-08-02 HISTORY — DX: Unspecified disorder of nose and nasal sinuses: J34.9

## 2011-08-02 LAB — POCT URINALYSIS DIP (DEVICE)
Bilirubin Urine: NEGATIVE
Glucose, UA: NEGATIVE mg/dL
Hgb urine dipstick: NEGATIVE
Ketones, ur: NEGATIVE mg/dL
Nitrite: NEGATIVE
Protein, ur: NEGATIVE mg/dL
Specific Gravity, Urine: 1.01 (ref 1.005–1.030)
Urobilinogen, UA: 0.2 mg/dL (ref 0.0–1.0)
pH: 7 (ref 5.0–8.0)

## 2011-08-02 MED ORDER — CEPHALEXIN 500 MG PO CAPS: 500.0000 mg | ORAL_CAPSULE | Freq: Four times a day (QID) | ORAL | Status: AC

## 2011-08-02 NOTE — ED Provider Notes (Signed)
History     CSN: [id number] Arrival date & time: 08/02/2011  3:10 PM   First MD Initiated Contact with Patient 08/02/11 1349      Chief Complaint  Patient presents with  . Dysuria    pt with c/o low back pain and discomfort with urination today  - headache onset x 5 days sinus congestion   . Back Pain    (Consider location/radiation/quality/duration/timing/severity/associated sxs/prior treatment) Patient is a 70 y.o. female presenting with dysuria and back pain. The history is provided by the patient.  Dysuria  This is a recurrent problem. The current episode started more than 2 days ago. The problem occurs every urination. The problem has not changed since onset.The quality of the pain is described as burning and stabbing. The pain is mild. There has been no fever. Associated symptoms include frequency and urgency. Pertinent negatives include no nausea, no vomiting, no discharge, no hematuria and no flank pain. Her past medical history is significant for recurrent UTIs.  Back Pain  Associated symptoms include dysuria.    Past Medical History  Diagnosis Date  . Hyperlipidemia   . Overdose of benzodiazepine 06/2005    Xanax  . Diabetes mellitus   . Genital herpes in women 1983  . Depression   . Allergy   . Bipolar 1 disorder   . Sinus disorder   . Irritable bowel syndrome (IBS)     Past Surgical History  Procedure Date  . Cataract surgery     L eye 12/2008, R eye 05/2009 [facility]  . Tubal ligation   . Eye surgery   . Cataract extraction     Family History  Problem Relation Age of Onset  . Cancer Mother     Bone  . Suicidality Father     History  Substance Use Topics  . Smoking status: Never Smoker   . Smokeless tobacco: Never Used  . Alcohol Use: No     1 x per year    OB History    Grav Para Term Preterm Abortions TAB SAB Ect Mult Living                  Review of Systems  Constitutional: Negative.   HENT: Positive for congestion and rhinorrhea.     Gastrointestinal: Negative.  Negative for nausea and vomiting.  Genitourinary: Positive for dysuria, urgency and frequency. Negative for hematuria and flank pain.  Musculoskeletal: Positive for back pain.    Allergies  Diphenhydramine hcl  Home Medications   Current Outpatient Rx  Name Route Sig Dispense Refill  . ACYCLOVIR 800 MG PO TABS Oral Take 1 tablet (800 mg total) by mouth 2 (two) times daily. 90 tablet 6  . ALPRAZOLAM 0.5 MG PO TABS Oral Take 0.5 mg by mouth at bedtime as needed. For insomnia/anxiety     . DIVALPROEX SODIUM 500 MG PO TBEC Oral Take 500 mg by mouth at bedtime.      Marland Kitchen HYDROCHLOROTHIAZIDE 25 MG PO TABS  TAKE ONE TABLET EVERY MORNING 30 tablet 5  . HYDROCODONE-ACETAMINOPHEN 5-325 MG PO TABS Oral Take 1 tablet by mouth every 6 (six) hours as needed for pain. 10 tablet 0  . LISINOPRIL 10 MG PO TABS  TAKE 1 TABLET EACH DAY 30 tablet 5  . PERPHENAZINE 4 MG PO TABS Oral Take 4 mg by mouth at bedtime as needed.      . TRAZODONE HCL 50 MG PO TABS Oral Take 50 mg by mouth at bedtime. Per  [facility]     . VENLAFAXINE HCL 150 MG PO CP24 Oral Take 300 mg by mouth daily. Per [facility] on [facility]     . DOXYCYCLINE HYCLATE 100 MG PO CAPS Oral Take 100 mg by mouth 2 (two) times daily. Per Dr Margo Aye     . GANCICLOVIR 0.15 % OP GEL Ophthalmic Apply 1 Tube to eye 5 (five) times daily. 1 Tube 0    Until improved then 3x/day for 1 week.    BP 188/90  Pulse 85  Temp(Src) 97.6 F (36.4 C) (Oral)  Resp 19  SpO2 97%  Physical Exam  Nursing note and vitals reviewed. Constitutional: She appears well-developed and well-nourished.  HENT:  Head: Normocephalic.  Abdominal: Soft. Normal appearance and bowel sounds are normal. She exhibits no distension and no mass. There is no hepatosplenomegaly. There is no tenderness. There is no rebound, no guarding and no CVA tenderness.  Skin: Skin is warm and dry.    ED Course  Procedures (including critical care  time)  Labs Reviewed  POCT URINALYSIS DIP (DEVICE) - Abnormal; Notable for the following:    Leukocytes, UA TRACE (*) Biochemical Testing Only. Please order routine urinalysis from main lab if confirmatory testing is needed.   All other components within normal limits  POCT URINALYSIS DIPSTICK   No results found.   No diagnosis found.    MDM  U/a reviewed.        Barkley Bruns, MD 08/02/11 1538

## 2011-08-06 ENCOUNTER — Encounter: Payer: Self-pay | Admitting: Family Medicine

## 2011-08-06 ENCOUNTER — Ambulatory Visit (INDEPENDENT_AMBULATORY_CARE_PROVIDER_SITE_OTHER): Payer: Medicare Other | Admitting: Family Medicine

## 2011-08-06 DIAGNOSIS — M79646 Pain in unspecified finger(s): Secondary | ICD-10-CM | POA: Insufficient documentation

## 2011-08-06 DIAGNOSIS — M79609 Pain in unspecified limb: Secondary | ICD-10-CM

## 2011-08-06 NOTE — Assessment & Plan Note (Signed)
Seems most consistent with a possible partial dislocation with subsequent pain.  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   No signs of infection or fracture or tendon rupture.  Will treat conservatively and watch

## 2011-08-06 NOTE — Progress Notes (Signed)
  Subjective:    Patient ID: TANISHI NAULT, female    DOB: 24-Nov-1940, 70 y.o.   MRN: [id number]  HPI  JOINT PAIN - Left Thumb Location: base of left thumb.   Course:  Happened suddenly when she was putting on a sweater.  Felt sudden sharp stinging pain though might be a bite.  Did not see a mark.  Since has been achy  Worse with:  movement Better with:  Rest and ibuprofen that she has at home Trauma: might have caught the thumb in her sweater Swelling:  mild Locking:  no Other Joints involved:  no Rash: no Fever:  No  Review of Symptoms - see HPI  PMH - Smoking status noted.      Review of Systems     Objective:   Physical Exam  left Thumb - pain with ROM but good pincer grip and can finger count with minimal pain.  No soft tissue swelling or erythema.  Pain worse with palpation of base of thumb.  No laxity       Assessment & Plan:

## 2011-08-06 NOTE — Patient Instructions (Signed)
Take the ibuprofen three times daily with small amount of food  Use ice on the base of your thumb if you use it a lot and it hurts  If it is aching but not after using a lot then use heat  It will take 3-6 weeks to be better it may take 3 months to get back to normal  You should see Dr Fara Boros for a checkup in the next 1-2 months

## 2011-09-19 ENCOUNTER — Encounter (HOSPITAL_COMMUNITY): Payer: Self-pay

## 2011-09-19 ENCOUNTER — Emergency Department (HOSPITAL_COMMUNITY)
Admission: EM | Admit: 2011-09-19 | Discharge: 2011-09-19 | Discharge status: Against Medical Advice | Payer: Medicare Other | Source: Home / Self Care | Attending: Family Medicine | Admitting: Family Medicine

## 2011-09-19 NOTE — ED Notes (Signed)
Multiple concerns; c/o 3 day duration of pain w urination, vaginal burning; states she tried her usual herpes medications, but thi did not feel like herpes, used monistat OTC , and this made it worse, used OTC urine pain relief med, but this did not help; also c/o lower abdominal pain

## 2011-09-19 NOTE — ED Notes (Signed)
Patient anxious, wanting to know when she will be seen; was reassured her MD would be in ASAP; offered beverage, but pt declined; concerned about her friend in car having to wait more time . Pt closed door, and short time thereafter, pt left facility to go home

## 2011-09-22 ENCOUNTER — Encounter: Payer: Self-pay | Admitting: Emergency Medicine

## 2011-09-22 ENCOUNTER — Ambulatory Visit (INDEPENDENT_AMBULATORY_CARE_PROVIDER_SITE_OTHER): Payer: Medicare Other | Admitting: Emergency Medicine

## 2011-09-22 VITALS — BP 159/88 | HR 82 | Temp 97.9°F | Ht 62.75 in | Wt 157.0 lb

## 2011-09-22 DIAGNOSIS — R300 Dysuria: Secondary | ICD-10-CM

## 2011-09-22 LAB — POCT URINALYSIS DIPSTICK
Bilirubin, UA: NEGATIVE
Nitrite, UA: NEGATIVE
Protein, UA: NEGATIVE
Spec Grav, UA: 1.02
Urobilinogen, UA: 0.2

## 2011-09-22 LAB — POCT UA - MICROSCOPIC ONLY

## 2011-09-22 MED ORDER — CEPHALEXIN 500 MG PO CAPS: 500.0000 mg | ORAL_CAPSULE | Freq: Two times a day (BID) | ORAL | Status: AC

## 2011-09-22 MED ORDER — CEPHALEXIN 500 MG PO CAPS: 500.0000 mg | ORAL_CAPSULE | Freq: Two times a day (BID) | ORAL | Status: DC

## 2011-09-22 NOTE — Assessment & Plan Note (Signed)
Symptoms consistent with UTI.  UA showed trace LE and ketones.  Will send urine for culture and treat with Keflex 500mg  BID x10 days.  Patient to return to clinic if no improvement on the antibiotics.  If urine culture negative, may need to consider other diagnoses for her recurrent UTIs.

## 2011-09-22 NOTE — Patient Instructions (Signed)
Your urine was consistent with a possible bladder infection.  I have sent a prescription for an antibiotic, Keflex, to your pharmacy.  If your symptoms do not improve on the antibiotic, please return to the clinic for further evaluation.  I have also sent the urine for culture to see what bacteria is causing the infection.

## 2011-09-22 NOTE — Progress Notes (Signed)
  Subjective:    Patient ID: Janice Ramos, female    DOB: Jul 20, 1941, 71 y.o.   MRN: [id number]  HPI [name] is here today for an acute visit for dysuria.  Dysuria: This has been present for 4-5 days now.  Accompanied by suprapubic pain, urinary frequency, and urinary "pressure."  Denies hematuria, fevers, chills, nausea, vomiting, back pain.  Has been drinking lots of water, but has noticed that urine is still on the dark side.  Also developed genital herpes outbreak 3 days ago, taking acyclovir, feels like starting to improve.  Denies any vaginal discharge, but does complain of some itching.  Recently sexually active with 98% condom use.  States this feels like UTIs she has had in the past.  States Keflex has worked well before.   Review of Systems Please see HPI, otherwise negative    Objective:   Physical Exam Vitals: reviewed General: comfortable appearing, alert, NAD Abd: soft, non-distended, mild suprapubic tenderness to palpation, no masses or organomegaly Back: no CVA tenderness     Assessment & Plan:

## 2011-09-24 LAB — URINE CULTURE: Colony Count: NO GROWTH

## 2011-10-10 ENCOUNTER — Other Ambulatory Visit: Payer: Self-pay | Admitting: Family Medicine

## 2011-11-17 ENCOUNTER — Ambulatory Visit
Admission: RE | Admit: 2011-11-17 | Discharge: 2011-11-17 | Disposition: A | Discharge status: Home / Self Care | Payer: Medicare Other | Source: Ambulatory Visit | Attending: Family Medicine | Admitting: Family Medicine

## 2011-11-17 ENCOUNTER — Encounter: Payer: Self-pay | Admitting: Family Medicine

## 2011-11-17 ENCOUNTER — Ambulatory Visit (INDEPENDENT_AMBULATORY_CARE_PROVIDER_SITE_OTHER): Payer: Medicare Other | Admitting: Family Medicine

## 2011-11-17 VITALS — BP 134/76 | HR 83 | Ht 62.75 in | Wt 159.0 lb

## 2011-11-17 DIAGNOSIS — E119 Type 2 diabetes mellitus without complications: Secondary | ICD-10-CM

## 2011-11-17 DIAGNOSIS — G8929 Other chronic pain: Secondary | ICD-10-CM

## 2011-11-17 DIAGNOSIS — M25539 Pain in unspecified wrist: Secondary | ICD-10-CM

## 2011-11-17 MED ORDER — MELOXICAM 7.5 MG PO TABS: 7.5000 mg | ORAL_TABLET | Freq: Every day | ORAL | Status: DC

## 2011-11-17 NOTE — Assessment & Plan Note (Signed)
Broad differential for this including dequervains,carpal tunnel, and degenerative disease. Will obtain bilateral wrist xrays. Case discussed with SM fellow Rodney Langton. Short course of NSAIDs for pain. Formal sports medicine referral.

## 2011-11-17 NOTE — Progress Notes (Signed)
  Subjective:    Patient ID: Janice Ramos, female    DOB: 01-08-1941, 71 y.o.   MRN: [id number]  HPI Pt presents today with wrist pain. Wrist pain is bilateral in nature, but R wrist has been most predominant issue.  Pt states that wrist pain has been several for at least 10-20 years. Pt states that she has noticed R wrist pain over the last year. Per pt " I just decided to come in to get this looked at". Pt denies any trauma associated with wrist pain. Pt states that pain has predominantly in thenar compartment of R wrist. No associated numbness. Pain is worse with grip. Pt works as an Tree surgeon. Pt denies any continuous typing or secretarial work. Pt is unsure if pain is worse at night as pt takes multiple sedating medications at night. Pain does not wake pt up at night. No wrist swelling.    Review of Systems See HPI, otherwise ROS negative     Objective:   Physical Exam Gen: in bed NAD MSK: Wrist: Inspection normal with no visible erythema or swelling. + Pain with R wrist flexion. Normal ulnar/radial deviation that is symmetrical with opposite wrist. + TTP on base of 1st metacarpal on R wrist  Strength 3-4/5 in all directions without pain. Negative Finkelstein, tinel's  + Phalens on R wrist.       Assessment & Plan:

## 2011-11-17 NOTE — Patient Instructions (Signed)
It was good to meet you today I'm getting some x-rays of her wrists I'm starting you some pain medication for this Be sure to followup followup sports medicine as instructed Call if any questions  God Bless, Doree Albee MD

## 2011-11-20 ENCOUNTER — Telehealth: Payer: Self-pay | Admitting: Family Medicine

## 2011-11-20 NOTE — Telephone Encounter (Signed)
Called pt. Left message to call us back. Please tell pt, that her x-rays were normal. .Arlyss Repress

## 2011-11-20 NOTE — Telephone Encounter (Signed)
Please contact patient either by phone or mail results of last xrays taken on 3/11

## 2011-12-01 ENCOUNTER — Ambulatory Visit: Payer: Medicare Other | Admitting: Family Medicine

## 2011-12-10 ENCOUNTER — Ambulatory Visit (INDEPENDENT_AMBULATORY_CARE_PROVIDER_SITE_OTHER): Payer: Medicare Other | Admitting: Family Medicine

## 2011-12-10 ENCOUNTER — Encounter: Payer: Self-pay | Admitting: Family Medicine

## 2011-12-10 VITALS — BP 115/76 | HR 84 | Ht 62.0 in | Wt 157.0 lb

## 2011-12-10 DIAGNOSIS — R32 Unspecified urinary incontinence: Secondary | ICD-10-CM

## 2011-12-10 DIAGNOSIS — R51 Headache: Secondary | ICD-10-CM

## 2011-12-10 DIAGNOSIS — I10 Essential (primary) hypertension: Secondary | ICD-10-CM

## 2011-12-10 DIAGNOSIS — M25539 Pain in unspecified wrist: Secondary | ICD-10-CM

## 2011-12-10 DIAGNOSIS — G8929 Other chronic pain: Secondary | ICD-10-CM

## 2011-12-10 MED ORDER — LISINOPRIL 10 MG PO TABS: 10.0000 mg | ORAL_TABLET | Freq: Every day | ORAL | Status: DC

## 2011-12-10 MED ORDER — HYDROCODONE-ACETAMINOPHEN 5-325 MG PO TABS: 1.0000 | ORAL_TABLET | Freq: Three times a day (TID) | ORAL | Status: DC | PRN

## 2011-12-10 MED ORDER — HYDROCHLOROTHIAZIDE 25 MG PO TABS: 25.0000 mg | ORAL_TABLET | Freq: Every day | ORAL | Status: DC

## 2011-12-10 NOTE — Assessment & Plan Note (Signed)
-   Being managed by GYN ?

## 2011-12-10 NOTE — Assessment & Plan Note (Signed)
Xrays negative, will refer to SM per last note.  + phalen, so may be carpal tunnel. Encouraged daily meloxicam rather than PRN until sports med eval.

## 2011-12-10 NOTE — Patient Instructions (Addendum)
It was nice to see you today. I'm so sorry you are still having so much wrist pain.  I have put in for a referral to sports medicine for your wrist pain.  They will call you to schedule an appointment. I am refilling your blood pressure medicines and your pain medicine for your headache. I would start taking the meloxicam every day, since it may help with some of the underlying wrist pain.  I would do this at least until you are seen by Sports Medicine. Let me know if the GYNs decide to do surgery for your urinary incontinence.  Come back and see me in the next few months so we can see how you are doing!

## 2011-12-10 NOTE — Assessment & Plan Note (Signed)
Unclear etiology.  Will need to look back at records for reason patient was started on norco for her headaches. May benefit from [facility] referral.

## 2011-12-10 NOTE — Assessment & Plan Note (Signed)
Well controlled, cont current mgmt.

## 2011-12-10 NOTE — Progress Notes (Signed)
S: Pt comes in today for follow up.  WRIST PAIN Pain continues to be an issue.  Is worst after finishing housework (esp with pulling, pushing, lifting).  Is only the right wrist.  Has been occuring for years.  Xrays done last month were normal.  Is very frustrated that she is being told her wrist is normal.  No weakness, numbness, tingling.  Pain is achy from wrist to mid forearm, no sharp or shooting pains, no radiation.    HEADACHE Would like refill on her Norco for her headaches.  Having severe headaches about 2x/week.  Worse with stress as well as when she has herpes outbreaks.  Is interested in possibly being seen by [facility]ache Clinic.    GYN Seeing GYN for a few issues for urinary incontinence issues.  Both stress and constant.     HYPERTENSION BP:  Meds: lisinopril, HCTZ Taking meds: Yes     # of doses missed/week: 0 Symptoms: Headache: Occasional Dizziness: No Vision changes: No SOB:  No Chest pain: No LE swelling: No Tobacco use: No    ROS: Per HPI  History  Smoking status  . Never Smoker   Smokeless tobacco  . Never Used    O:  Filed Vitals:   12/10/11 1030  BP: 115/76  Pulse: 84    Gen: NAD CV: RRR, no murmur Pulm: CTA bilat, no wheezes or crackles Ext: Warm, no chronic skin changes, no edema, + pain with phalen    A/P: 71 y.o. female p/w wrist pain, headaches, HTN -See problem list -f/u in 1-3 months

## 2011-12-11 ENCOUNTER — Encounter: Payer: Self-pay | Admitting: Family Medicine

## 2011-12-29 ENCOUNTER — Ambulatory Visit (INDEPENDENT_AMBULATORY_CARE_PROVIDER_SITE_OTHER): Payer: Medicare Other | Admitting: Family Medicine

## 2011-12-29 VITALS — BP 123/79

## 2011-12-29 DIAGNOSIS — M25539 Pain in unspecified wrist: Secondary | ICD-10-CM

## 2011-12-29 DIAGNOSIS — G8929 Other chronic pain: Secondary | ICD-10-CM

## 2011-12-30 NOTE — Progress Notes (Signed)
  Subjective:    Patient ID: Janice Ramos, female    DOB: 1940-10-19, 71 y.o.   MRN: [id number]  HPI  B wrist pain for  6 or more months. R.L. Right hand dominant. Worse with activities. Ibuprofen helps some. Pain is achiing--no numbness. Worse with activity, better with rest.  Review of Systems    Pertinent review of systems: negative for fever or unusual weight change. No redness or swelling of wrists Objective:   Physical Exam  Vital signs reviewed. GENERAL: Well developed, well nourished, no acute distress WRIST B have from in flexion and extension. Neg tinel, negative phalens. Area she points to for pain if from wrist crease up entire volar forearm. Sensation intact to soft touch and 2 point discrimination is intact.  SKIN:Normal on B hands, no rash, no color changes     IMAGING of B wrists show no significant degenerative disease. Assessment & Plan:  1. Non specific wrist and volar forearm pain--not consistent with carpal tunnel ; not consistent with OA. No apparent neuroathy. Will try home exercise program for wrist strengthening particularly in flexion and extension. Program given in handout form and reviewed. We'll see her back in 4-6 weeks.

## 2012-01-12 ENCOUNTER — Ambulatory Visit (INDEPENDENT_AMBULATORY_CARE_PROVIDER_SITE_OTHER): Payer: Medicare Other | Admitting: Family Medicine

## 2012-01-12 VITALS — BP 130/70

## 2012-01-12 DIAGNOSIS — M25539 Pain in unspecified wrist: Secondary | ICD-10-CM

## 2012-01-12 DIAGNOSIS — G8929 Other chronic pain: Secondary | ICD-10-CM

## 2012-01-13 NOTE — Progress Notes (Signed)
  Subjective:    Patient ID: Janice Ramos, female    DOB: 1940-09-15, 71 y.o.   MRN: [id number]  HPI  Followup of wrist pain. We had tried her on a home exercise program which did not seem to help much. She is cognitive or would like to get a corticosteroid injection. Her wrist pain continues particularly when she is picking things up. She is right-hand dominant. She's never had a specific injury to that wrist and she has had no surgery that she is aware of  Review of Systems Denies numbness or tingling in her fingers. Denies skin change. Denies warmth or swelling of the wrist.    Objective:   Physical Exam  GENERAL: Well-developed female no acute distress  WRIST:: Right. To palpation over the dorsum of the wrist. Full range of motion in flexion and extension. She has normal wrists strength. Distally she is in her breasts intact.  INJECTION: Patient was given informed consent, signed copy in the chart. Appropriate time out was taken. Area prepped and draped in usual sterile fashion. 1 cc of methylprednisolone 40 mg/ml plus  1 cc of 1% lidocaine without epinephrine was injected into the right wrist using a(n) dorsal approach. The patient tolerated the procedure well. There were no complications. Post procedure instructions were given.     Assessment & Plan:  #1. Wrist pain. Her x-rays did not show significant osteoarthritis I reviewed those with her today. I'm not sure a corticosteroid injection will help her but she wants to proceed with that and I think it is reasonable. I would not be in favor of doing multiple serial injections however she will follow up one month.

## 2012-02-05 ENCOUNTER — Telehealth: Payer: Self-pay | Admitting: Obstetrics and Gynecology

## 2012-02-05 NOTE — Telephone Encounter (Signed)
Janice Ramos/last seen 10/22/2011

## 2012-02-06 ENCOUNTER — Telehealth: Payer: Self-pay

## 2012-02-06 NOTE — Telephone Encounter (Signed)
Called pt, re: message she left about needing appt asap. Pt states she is having a herpes outbreak, but has meds, so she will observe to see if sx's improve and if not, she will call for appt. Janice Ramos A

## 2012-02-16 ENCOUNTER — Encounter: Payer: Self-pay | Admitting: Home Health Services

## 2012-03-24 ENCOUNTER — Telehealth: Payer: Self-pay | Admitting: *Deleted

## 2012-03-24 NOTE — Telephone Encounter (Signed)
Patient has not been seen in over a year.  Dr. Daiva Eves told her to call when she had an active herpes lesion so she could be seen.  She has a lesion now and is requesting appt.  Dr. Daiva Eves does not have any available appts, please advise. Wendall Mola CMA

## 2012-03-25 NOTE — Telephone Encounter (Signed)
Last seen 06/2011

## 2012-03-26 NOTE — Telephone Encounter (Signed)
Patient notified

## 2012-03-26 NOTE — Telephone Encounter (Signed)
Unfortuantely I am not arround for clinic. If she were to come to PCP office they MIGHT be able to do HSV PCR of lesion. It needs to be unroofed usually with small needle head and then area underneath sent for HSV PCR.  Can she take a photo of it either way to bring in? They can be informative too.

## 2012-04-01 ENCOUNTER — Telehealth: Payer: Self-pay | Admitting: Obstetrics and Gynecology

## 2012-04-01 NOTE — Telephone Encounter (Signed)
TC to pt. LM to return call regarding message. 

## 2012-04-01 NOTE — Telephone Encounter (Signed)
Triage/rx req. °

## 2012-04-06 ENCOUNTER — Ambulatory Visit (INDEPENDENT_AMBULATORY_CARE_PROVIDER_SITE_OTHER): Payer: Medicare Other | Admitting: Family Medicine

## 2012-04-06 ENCOUNTER — Encounter: Payer: Self-pay | Admitting: Family Medicine

## 2012-04-06 VITALS — BP 149/88 | HR 68 | Temp 98.2°F | Ht 62.0 in | Wt 156.0 lb

## 2012-04-06 DIAGNOSIS — R55 Syncope and collapse: Secondary | ICD-10-CM | POA: Insufficient documentation

## 2012-04-06 DIAGNOSIS — I10 Essential (primary) hypertension: Secondary | ICD-10-CM

## 2012-04-06 DIAGNOSIS — B999 Unspecified infectious disease: Secondary | ICD-10-CM

## 2012-04-06 DIAGNOSIS — A6000 Herpesviral infection of urogenital system, unspecified: Secondary | ICD-10-CM

## 2012-04-06 LAB — BASIC METABOLIC PANEL
BUN: 19 mg/dL (ref 6–23)
CO2: 29 mEq/L (ref 19–32)
Calcium: 9.6 mg/dL (ref 8.4–10.5)
Glucose, Bld: 83 mg/dL (ref 70–99)
Sodium: 138 mEq/L (ref 135–145)

## 2012-04-06 LAB — CBC
HCT: 39.4 % (ref 36.0–46.0)
Hemoglobin: 13.7 g/dL (ref 12.0–15.0)
MCH: 32.3 pg (ref 26.0–34.0)
RBC: 4.24 MIL/uL (ref 3.87–5.11)

## 2012-04-06 MED ORDER — ACYCLOVIR 800 MG PO TABS: 800.0000 mg | ORAL_TABLET | Freq: Two times a day (BID) | ORAL | Status: DC

## 2012-04-06 NOTE — Progress Notes (Signed)
S: Pt comes in today for SDA for "passing out" over the weekend. Episode happened around 1pm Sunday afternoon.  Had just finished eating [place] food and boyfriend brought her some watermelon and a pudding of some sort.  She took a big bite of unknown pudding and it tasted very badly and salty, then she felt really "woozy" and knew something was wrong.  Felt like the room was spinning.  Tried to get up and fell to floor.   Of note, is also at the end of a HSV outbreak and not feeling very well to begin with.  Blacked out when it happened.  Thinks she loss consciousness.  Only lasted a few seconds. No reported seizure-like activity, no emesis.  Sat up in chair after episode, and continued to have a rocking feeling (which she has been having x11 days).  Waiting 40 minutes and then she drove home fine, w/o issue.  Did not go to ED or UC.  No other syncopal episodes.  Boyfriend insisted that she make sure everything was ok and be checked-- she would rather have not come in b/c feeling much better.    No recent changes to medicines.  No new OTC medicines.    HYPERTENSION BP: 149/88 Meds: lisinopril 10, HCTZ 25 Taking meds: Yes     # of doses missed/week: 0 Symptoms: Headache: No Dizziness: Yes : see above Vision changes: No SOB:  No Chest pain: No LE swelling: No Tobacco use: No   ROS: Per HPI  History  Smoking status  . Never Smoker   Smokeless tobacco  . Never Used    O:  Filed Vitals:   04/06/12 1126  BP: 149/88  Pulse: 68  Temp: 98.2 F (36.8 C)    Gen: NAD HEENT: MMM, EOMI, PERRLA, no pharyngeal erythema or exudate, no cervical LAD CV: RRR, no murmur Pulm: CTA bilat, no wheezes or crackles Ext: Warm, no chronic skin changes, no edema   A/P: 71 y.o. female p/w syncopal episode, HTN -See problem list -f/u in 1 month

## 2012-04-06 NOTE — Assessment & Plan Note (Signed)
Previously well controlled.  Pt would prefer to wait until she is not feeling badly to make med changes.  Cont HCTZ 25, Lisinopril 10 for now. F/u 1 month.   BP Readings from Last 3 Encounters:  04/06/12 149/88  01/12/12 130/70  12/29/11 123/79

## 2012-04-06 NOTE — Assessment & Plan Note (Signed)
Likely combination of already not feeling well due to HSV outbreak + vasovagal due to bad taste +/- orthostatic from standing.  No residual effects other than some ?mild vertigo-- could be post-viral, does not fit story for BPPV. No hearing loss.  Will continue to monitor.  Check BMET and CBC.  F/u 1 month, sooner for any further episodes.

## 2012-04-06 NOTE — Patient Instructions (Signed)
It was good to see you.  I think that the reason you passed out was from multiple reasons.  We will check some labs to make sure everything in your blood is normal, and I will send you a letter with the results.  I am refilling your acyclovir.  Make sure you are staying hydrated.   Come back and see me again in about 1 month.

## 2012-04-07 ENCOUNTER — Encounter: Payer: Self-pay | Admitting: Family Medicine

## 2012-04-21 ENCOUNTER — Encounter: Payer: Self-pay | Admitting: Family Medicine

## 2012-04-21 ENCOUNTER — Ambulatory Visit (INDEPENDENT_AMBULATORY_CARE_PROVIDER_SITE_OTHER): Payer: Medicare Other | Admitting: Family Medicine

## 2012-04-21 VITALS — BP 149/91 | HR 74 | Temp 98.4°F | Ht 62.0 in | Wt 155.2 lb

## 2012-04-21 DIAGNOSIS — N760 Acute vaginitis: Secondary | ICD-10-CM

## 2012-04-21 LAB — POCT WET PREP (WET MOUNT): Clue Cells Wet Prep Whiff POC: NEGATIVE

## 2012-04-21 MED ORDER — FLUCONAZOLE 150 MG PO TABS: 150.0000 mg | ORAL_TABLET | Freq: Once | ORAL | Status: AC

## 2012-04-21 NOTE — Assessment & Plan Note (Signed)
A: suspect yeast. Wet prep obtained.  P: diflucan 150 mg x 1.

## 2012-04-21 NOTE — Patient Instructions (Signed)
Kynadi,  Thank you very much for coming in to see me today. I have collected and wet prep to assess your vaginitis. I do believe it is a yeast infection which is best treated with diflucan. I have went ahead and sent diflucan to your pharmacy.  I will call you with the wet prep results.  Dr. Armen Pickup

## 2012-04-21 NOTE — Progress Notes (Signed)
Patient ID: BRYNLYN DADE, female   DOB: [birth year], 71 y.o.   MRN: [id number] Subjective:     ALVINA STROTHER is a 71 y.o. female with a history of bipolar depression who presents to discuss the following:  1. Found a bug (appers to be a pin worm): in the tub yesterday. Concerned that it is causing her boyfriend's GI upset. She denies GI upset in herself aside from occasional bloating.   2. Vaginal itching: x 3 weeks. Self treated with a douche and OTC yeast infection cream w/o improvement. She reports that she also had a herpes outbreak during this time. Although she admits that she cannot see her perineum in order to determine if there are lesions. She takes acyclovir daily. She denies vaginal discharge and dysuria. She is sexually active with her boyfriend of 1 year.   Review of Systems Pertinent items are noted in HPI.     Objective:    BP 149/91  Pulse 74  Temp 98.4 F (36.9 C) (Oral)  Ht 5\' 2"  (1.575 m)  Wt 155 lb 3.2 oz (70.398 kg)  BMI 28.39 kg/m2 General appearance: alert, cooperative and no distress Abdomen: soft, non-tender; bowel sounds normal; no masses,  no organomegaly Pelvic: external genitalia normal w/o ulcers or lesions. Cervix normal in appearance, no adnexal masses or tenderness, no cervical motion tenderness and vagina with thick white discharge.     Assessment and Plan:   1. ? Worm: advised patient to have her boyfriend discuss his GI concerns with his PCP. Reassurance to patient.

## 2012-04-22 ENCOUNTER — Telehealth: Payer: Self-pay | Admitting: Family Medicine

## 2012-04-22 NOTE — Telephone Encounter (Signed)
Called patient left VM. Wet prep negative. Ok to take diflucan. If symptoms persist asked patient to call in for f/u visit with her PCP.

## 2012-04-27 ENCOUNTER — Encounter: Payer: Self-pay | Admitting: Family Medicine

## 2012-04-27 ENCOUNTER — Ambulatory Visit (INDEPENDENT_AMBULATORY_CARE_PROVIDER_SITE_OTHER): Payer: Medicare Other | Admitting: Family Medicine

## 2012-04-27 VITALS — BP 135/79 | HR 89 | Temp 98.3°F | Ht 62.0 in | Wt 155.8 lb

## 2012-04-27 DIAGNOSIS — N9089 Other specified noninflammatory disorders of vulva and perineum: Secondary | ICD-10-CM

## 2012-04-27 DIAGNOSIS — R300 Dysuria: Secondary | ICD-10-CM

## 2012-04-27 LAB — POCT URINALYSIS DIPSTICK
Bilirubin, UA: NEGATIVE
Leukocytes, UA: NEGATIVE
Nitrite, UA: NEGATIVE
Protein, UA: NEGATIVE
pH, UA: 6

## 2012-04-27 NOTE — Assessment & Plan Note (Signed)
A: labial irritation. Normal exam. P: sitz baths. F/u with PCP.

## 2012-04-27 NOTE — Patient Instructions (Signed)
Janice Ramos,  Thank you for following up today. Your exam is normal. Your UA is normal.  Please try sitz baths: sit in warm water to relieve pain and continue acyclovir for suppression. It is important to follow up with your PCP, Dr. Fara Boros if symptoms persist.   Dr. Armen Pickup

## 2012-04-27 NOTE — Progress Notes (Signed)
Subjective:     Patient ID: Janice Ramos, female   DOB: 04/09/1941, 71 y.o.   MRN: [id number]  HPI Patient reports for persistent labial burning x 3 weeks. She continues to take acyclovir. She is unsure if she has labial lesions. She denies dysuria, urgency and frequency. Her labia burns when she urinates.   Review of Systems As per HPI   Objective:   Physical Exam BP 135/79  Pulse 89  Temp 98.3 F (36.8 C) (Oral)  Ht 5\' 2"  (1.575 m)  Wt 155 lb 12.8 oz (70.67 kg)  BMI 28.50 kg/m2 General appearance: alert, cooperative and no distress Pelvic: external genitalia normal    Assessment and Plan:

## 2012-07-01 ENCOUNTER — Ambulatory Visit (INDEPENDENT_AMBULATORY_CARE_PROVIDER_SITE_OTHER): Payer: Medicare Other | Admitting: Family Medicine

## 2012-07-01 ENCOUNTER — Encounter: Payer: Self-pay | Admitting: Family Medicine

## 2012-07-01 VITALS — BP 129/70 | HR 86 | Temp 98.5°F | Ht 62.0 in | Wt 152.0 lb

## 2012-07-01 DIAGNOSIS — J111 Influenza due to unidentified influenza virus with other respiratory manifestations: Secondary | ICD-10-CM

## 2012-07-01 MED ORDER — HYDROCODONE-ACETAMINOPHEN 5-325 MG PO TABS: 1.0000 | ORAL_TABLET | Freq: Four times a day (QID) | ORAL | Status: DC | PRN

## 2012-07-01 NOTE — Progress Notes (Signed)
Patient ID: Janice Ramos, female   DOB: Nov 02, 1940, 71 y.o.   MRN: [id number] Subjective:     Janice Ramos is a 71 y.o. female who presents for evaluation of influenza like symptoms. Symptoms include chills, headache, myalgias, productive cough, sore throat, swollen glands and fever and have been present for 3 days. She describes the headache as severe.  She has tried to alleviate the symptoms with ibuprofen with minimal relief. High risk factors for influenza complications: age greater than 65 years of age.  The following portions of the patient's history were reviewed and updated as appropriate: allergies, current medications, past family history, past medical history, past social history, past surgical history and problem list. She did not receive a flu shot this year.  Review of Systems She denies nausea, vomiting, poor fluid intake, dyspnea, chest pain, no visual changes or neurologic symptoms.   Objective:    BP 129/70  Pulse 86  Temp 98.5 F (36.9 C) (Oral)  Ht 5\' 2"  (1.575 m)  Wt 152 lb (68.947 kg)  BMI 27.80 kg/m2  SpO2 96% General appearance: alert, cooperative and no distress Ears: normal TM's and external ear canals both ears Nose: mild congestion, turbinates red Throat: lips, mucosa, and tongue normal; teeth and gums normal Neck: moderate anterior cervical adenopathy, supple, symmetrical, trachea midline and thyroid not enlarged, symmetric, no tenderness/mass/nodules Lungs: clear to auscultation bilaterally Heart: regular rate and rhythm, S1, S2 normal, no murmur, click, rub or gallop Pulses: 2+ and symmetric    Assessment/Plan:

## 2012-07-01 NOTE — Assessment & Plan Note (Addendum)
Unclear if true Influenza vs influenza like illness.  Symptoms >3 days, no indication for treatment. Reviewed red flags to return to care and discussed symptomatic treatment, especially hydration.  Rx for Vicodin 5/325 #10 pills for headache and body ache.  Unclear if this headache is only from the viral illness or complicated by her chronic headaches.   Hand out on Influenza given.

## 2012-07-01 NOTE — Patient Instructions (Signed)
Influenza, Adult Influenza ("the flu") is a viral infection of the respiratory tract. It occurs more often in winter months because people spend more time in close contact with one another. Influenza can make you feel very sick. Influenza easily spreads from person to person (contagious). CAUSES   Influenza is caused by a virus that infects the respiratory tract. You can catch the virus by breathing in droplets from an infected person's cough or sneeze. You can also catch the virus by touching something that was recently contaminated with the virus and then touching your mouth, nose, or eyes. SYMPTOMS   Symptoms typically last 4 to 10 days and may include:  Fever.   Chills.   Headache, body aches, and muscle aches.   Sore throat.   Chest discomfort and cough.   Poor appetite.   Weakness or feeling tired.   Dizziness.   Nausea or vomiting.  DIAGNOSIS   Diagnosis of influenza is often made based on your history and a physical exam. A nose or throat swab test can be done to confirm the diagnosis. RISKS AND COMPLICATIONS You may be at risk for a more severe case of influenza if you smoke cigarettes, have diabetes, have chronic heart disease (such as heart failure) or lung disease (such as asthma), or if you have a weakened immune system. Elderly people and pregnant women are also at risk for more serious infections. The most common complication of influenza is a lung infection (pneumonia). Sometimes, this complication can require emergency medical care and may be life-threatening. PREVENTION   An annual influenza vaccination (flu shot) is the best way to avoid getting influenza. An annual flu shot is now routinely recommended for all adults in the [place] TREATMENT   In mild cases, influenza goes away on its own. Treatment is directed at relieving symptoms. For more severe cases, your caregiver may prescribe antiviral medicines to shorten the sickness. Antibiotic medicines are not effective,  because the infection is caused by a virus, not by bacteria. HOME CARE INSTRUCTIONS  Only take over-the-counter or prescription medicines for pain, discomfort, or fever as directed by your caregiver.   Use a cool mist humidifier to make breathing easier.   Get plenty of rest until your temperature returns to normal. This usually takes 3 to 4 days.   Drink enough fluids to keep your urine clear or pale yellow.   Cover your mouth and nose when coughing or sneezing, and wash your hands well to avoid spreading the virus.   Stay home from work or school until your fever has been gone for at least 1 full day.  SEEK MEDICAL CARE IF:    You have chest pain or a deep cough that worsens or produces more mucus.   You have nausea, vomiting, or diarrhea.  SEEK IMMEDIATE MEDICAL CARE IF:    You have difficulty breathing, shortness of breath, or your skin or nails turn bluish.   You have severe neck pain or stiffness.   You have a severe headache, facial pain, or earache.   You have a worsening or recurring fever.   You have nausea or vomiting that cannot be controlled.  MAKE SURE YOU:  Understand these instructions.   Will watch your condition.   Will get help right away if you are not doing well or get worse.  Document Released: 08/22/2000 Document Revised: 02/24/2012 Document Reviewed: 11/24/2011 [employer] Patient Information 2013 [employer].    

## 2012-09-14 ENCOUNTER — Other Ambulatory Visit: Payer: Self-pay | Admitting: Family Medicine

## 2012-09-15 NOTE — Telephone Encounter (Signed)
Needs appt with PCP for further refills

## 2012-10-27 ENCOUNTER — Other Ambulatory Visit: Payer: Self-pay | Admitting: Family Medicine

## 2012-11-04 ENCOUNTER — Encounter: Payer: Self-pay | Admitting: Family Medicine

## 2012-11-04 ENCOUNTER — Ambulatory Visit (INDEPENDENT_AMBULATORY_CARE_PROVIDER_SITE_OTHER): Payer: Medicare Other | Admitting: Family Medicine

## 2012-11-04 VITALS — BP 144/87 | HR 95 | Temp 98.9°F | Ht 62.0 in | Wt 158.0 lb

## 2012-11-04 DIAGNOSIS — F319 Bipolar disorder, unspecified: Secondary | ICD-10-CM

## 2012-11-04 MED ORDER — MUPIROCIN 2 % EX OINT: TOPICAL_OINTMENT | Freq: Three times a day (TID) | CUTANEOUS | Status: DC

## 2012-11-04 MED ORDER — HYDROXYZINE HCL 25 MG PO TABS: 25.0000 mg | ORAL_TABLET | Freq: Every evening | ORAL | Status: DC | PRN

## 2012-11-04 NOTE — Assessment & Plan Note (Signed)
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 Do not think this is related to cat scratch, also eczema and scabies do not seem likely. Do not feel like this warrants systemic antibiotics, but will recommend topical mupirocin and hydroxyzine qhs prn for itching to break the cycle. She could continue topical steroids also. Discussed main risk factor being continued scratching and auto-innoculation. Recommend f/u with PCP in 1-2 weeks.

## 2012-11-04 NOTE — Assessment & Plan Note (Signed)
Patient states she is not bipolar, but does have severe anxiety and this is likely contributing to rash. Hopefully hydroxyzine can help.

## 2012-11-04 NOTE — Patient Instructions (Addendum)
I think you have a slight skin infection. Use the mupirocin topically.  Try the anti-histamine pill at night for itching.  Call if rash is getting worse or you have fever, chills, redness spreads.  Impetigo Impetigo is an infection of the skin, most common in babies and children.  CAUSES  It is caused by staphylococcal or streptococcal germs (bacteria). Impetigo can start after any damage to the skin. The damage to the skin may be from things like:   Chickenpox.  Scrapes.  Scratches.  Insect bites (common when children scratch the bite).  Cuts.  Nail biting or chewing. Impetigo is contagious. It can be spread from one person to another. Avoid close skin contact, or sharing towels or clothing. SYMPTOMS  Impetigo usually starts out as small blisters or pustules. Then they turn into tiny yellow-crusted sores (lesions).  There may also be:  Large blisters.  Itching or pain.  Pus.  Swollen lymph glands. With scratching, irritation, or non-treatment, these small areas may get larger. Scratching can cause the germs to get under the fingernails; then scratching another part of the skin can cause the infection to be spread there. DIAGNOSIS  Diagnosis of impetigo is usually made by a physical exam. A skin culture (test to grow bacteria) may be done to prove the diagnosis or to help decide the best treatment.  TREATMENT  Mild impetigo can be treated with prescription antibiotic cream. Oral antibiotic medicine may be used in more severe cases. Medicines for itching may be used. HOME CARE INSTRUCTIONS   To avoid spreading impetigo to other body areas:  Keep fingernails short and clean.  Avoid scratching.  Cover infected areas if necessary to keep from scratching.  Gently wash the infected areas with antibiotic soap and water.  Soak crusted areas in warm soapy water using antibiotic soap.  Gently rub the areas to remove crusts. Do not scrub.  Wash hands often to avoid spread  this infection.  Keep children with impetigo home from school or daycare until they have used an antibiotic cream for 48 hours (2 days) or oral antibiotic medicine for 24 hours (1 day), and their skin shows significant improvement.  Children may attend school or daycare if they only have a few sores and if the sores can be covered by a bandage or clothing. SEEK MEDICAL CARE IF:   More blisters or sores show up despite treatment.  Other family members get sores.  Rash is not improving after 48 hours (2 days) of treatment. SEEK IMMEDIATE MEDICAL CARE IF:   You see spreading redness or swelling of the skin around the sores.  You see red streaks coming from the sores.  Your child develops a fever of 100.4 F (37.2 C) or higher.  Your child develops a sore throat.  Your child is acting ill (lethargic, sick to their stomach). Document Released: 08/22/2000 Document Revised: 11/17/2011 Document Reviewed: 06/21/2008 [facility] [address] Patient Information 2013 [place], [place].

## 2012-11-04 NOTE — Progress Notes (Signed)
  Subjective:    Patient ID: Janice Ramos, female    DOB: November 24, 1940, 72 y.o.   MRN: [id number]  Extremity Weakness     72 yo patient with anxiety/depression presents with some right arm rash with burning and itching for 6-7 days. States she has history of rash with picking according to her previous dermatolgist. She does endorse scratching areas frequently on her right neck and and right arm. These seemed to erupt at the same time but are improving. Has tried hydrocortisone cream. Itching worse at night. Not present on her finger spaces, left side, groin or her scalp.  Denies fever, chills, oozing, pus formation, swelling, bleeding, exposure animals (except for pet cat that does not scratch), oral lesions.  She has not changed or decreased any of her psychiatric meds recently.   Review of Systems  Musculoskeletal: Positive for extremity weakness.   See HPI otherwise negative.  reports that she has never smoked. She has never used smokeless tobacco. Also endorses general feeling of increased stress/anxiety about finances, malaise, weakness.     Objective:   Physical Exam  Vitals reviewed. Constitutional: She is oriented to person, place, and time. She appears well-developed and well-nourished. No distress.  Appears anxious. Hair desheveled.  HENT:  Head: Normocephalic and atraumatic.  Mouth/Throat: Oropharynx is clear and moist. No oropharyngeal exudate.  Scab on distal nose.  Eyes: EOM are normal.  Neck: Normal range of motion. Neck supple.  Pulmonary/Chest: Effort normal.  Lymphadenopathy:    She has no cervical adenopathy.  Neurological: She is alert and oriented to person, place, and time.  Skin: Rash noted. She is not diaphoretic.  Has two small < 1cm areas of shallow ulceration, slight erythematous border on her right elbow crease and right neck (two tiny lesions).  No abscess, induration, pus, bleeding, lymphadenopathy or streaking.   Psychiatric:  Anxious appearing.  Dress normal. Speech slightly fast.          Assessment & Plan:

## 2012-11-12 ENCOUNTER — Ambulatory Visit: Payer: Medicare Other | Admitting: Family Medicine

## 2012-11-25 ENCOUNTER — Encounter: Payer: Self-pay | Admitting: Family Medicine

## 2012-11-25 ENCOUNTER — Ambulatory Visit (INDEPENDENT_AMBULATORY_CARE_PROVIDER_SITE_OTHER): Payer: Medicare Other | Admitting: Family Medicine

## 2012-11-25 VITALS — BP 138/79 | HR 75 | Ht 62.0 in | Wt 157.0 lb

## 2012-11-25 DIAGNOSIS — R102 Pelvic and perineal pain: Secondary | ICD-10-CM

## 2012-11-25 DIAGNOSIS — R300 Dysuria: Secondary | ICD-10-CM | POA: Insufficient documentation

## 2012-11-25 LAB — POCT UA - MICROSCOPIC ONLY

## 2012-11-25 LAB — POCT URINALYSIS DIPSTICK
Blood, UA: NEGATIVE
Nitrite, UA: NEGATIVE
Urobilinogen, UA: 0.2
pH, UA: 6

## 2012-11-25 LAB — POCT WET PREP (WET MOUNT)

## 2012-11-25 NOTE — Progress Notes (Signed)
  Subjective:    Patient ID: Janice Ramos, female    DOB: 08/12/1941, 72 y.o.   MRN: [id number]  HPI: Pt presents to clinic for incontinence with dysuria for about 7-10 days. Pt also describes concomitant herpes outbreak (pt has a hx of genital HSV for decades, on acyclovir for suppression). Pt has had pain and discomfort for the past week with urination. For the past week, she has had increased frequency and dribbling with some low back and flank pain. Some subjective fever/chills, "off and on," not definitely in relation to other symptoms.   Pt states she has been incontinent of urine but reports mixed circumstances of leakage; sometimes with laughing/coughing, sometimes in relation to "getting up quick to urinate" due to increased frequency as above, sometimes "just with leaking when nothing is going on." She does state "stress in general" makes it worse, and she tends to have worse incontinence when she has UTI's. Pt reports she has been seen by GYN (Dr. Konrad Dolores) "for years" for incontinence, but she claims no real management/work-up/treatment has been done. She also states she has discussed it with Dr. Fara Boros (her PCP) before, and it has been mentioned that she may need surgery. Pt states she would like to be seen by urology "if it's appropriate."  SH: Reviewed. Currently sexually active with one partner (not new); reports always using condoms. Last intercourse ~10 days ago, which was uncomfortable. Pt is post-menopausal.  Review of Systems: As above. Otherwise feels "reasonably well" with good mood and no SI/HI. Denies systemic symptoms; no nausea/vomiting, chest pain, SOB, abdominal pain. Abdominal pain "off and on" as well, attributed by pt to "IBS."     Objective:   Physical Exam BP 138/79  Pulse 75  Ht 5\' 2"  (1.575 m)  Wt 157 lb (71.215 kg)  BMI 28.71 kg/m2 Gen: elderly female in NAD, pleasant and appropriate in conversation HEENT: MMM, EOMI, TMs clear bilatearally Cardio: RRR, no  murmur appreciated Pulm: CTAB, no wheezes MSK: mild CVA tenderness GU: external genitalia with few scattered reddish vesicular lesions  Vaginal walls normal; scant amount of whitish discharge present in vaginal vault  Cervix with few reddish circular lesions at 10 o'clock and 5-6 o'clock; no friability Neuro: no gross focal deficits Psych: normal mood, appropriate affect, thought content normal, process linear and goal-oriented     Assessment & Plan:  Current HSV outbreak. Dysuria possibly due to UTI. Incontinence of urine; uncertain etiology, likely multifactorial. Greater than 30 minutes spent with pt, more than 15 of which were devoted to counseling. Discussed with Dr. Deirdre Priest. See A&P notes.

## 2012-11-25 NOTE — Assessment & Plan Note (Addendum)
A: Uncertain cause/type given extent of ("years" on and off, etc) and mixed description(s) of symptoms; see HPI. Current complaints could be exacerbated by HSV outbreak; UTI also possible. From pt's statements/descriptions, has not been fully worked up or specifically managed by pt's OBGYN. Pt would like more specific management and potentially would like to be seen by urology if appropriate.  P: Discussed with Dr. Deirdre Priest. Greater than 30 minutes spent with pt, more than 15 of which was devoted to counseling and development/discussion of potential management plan with pt. Given current flare of HSV and possible UTI, will defer referral until further characterization of hx and course and possible causes can be identified. Pt is to keep a voiding diary and return to clinic for specific visit with PCP to discuss further work-up and possible referral. See also other problem list notes from this visit.

## 2012-11-25 NOTE — Assessment & Plan Note (Signed)
A: Increased frequency, dribbling, and pain/discomfort with urination for 7-10 days, worse for the last 3. UA shows trace leukocytes but otherwise is unremarkable. Wet prep without yeast, clue cells, or trich, but does show many WBC.  P: Urine culture ordered. Will follow up with pt by phone with results and treat with abx if appropriate. May consider empiric Diflucan if culture is negative (no definite yeast on wet prep, but >20 WBC/hpf).

## 2012-11-25 NOTE — Patient Instructions (Signed)
Thank you for coming in, today. It was nice to meet you. For your herpes outbreak:   Take acyclovir 800 mg THREE times per day, for two days, starting tomorrow.  Then go back to 800 mg twice per day. For your urinary complaints:  I will call you in a day or two to let you know what your urine culture looks like.  If we need, I will prescribe you an antibiotic.  Make an appointment to talk to Dr. Fara Boros specifically about having incontinence. In the meantime, keep a "voiding diary" and bring it back with you next time.  Write down when you urinate, what time, and how often (date, time, etc).  Write down when you have leakage, dribbling, or other loss of urine, too, and the circumstances (were you walking, laughing, coughing, and so on).  I will put in my note the things we talked about, and we'll consider sending you to urology in the future vs trying medications, etc. Please call at any time with any questions or concerns. --Dr. Casper Harrison

## 2012-11-25 NOTE — Assessment & Plan Note (Signed)
A: Pt with few small lesions to external genitalia consistent with herpes outbreak, as well as symptoms of itching and burning. Outbreaks potentially contributing to incontinence/dysuria, as well. Pt describes many recurrent outbreaks "for many years" (on chart review, pt apparently contracted HSV in 1983). Currently on acyclovir 800 mg BID for suppressive therapy.   P: Plan to increase acyclovir to 800 mg TID for 3 days, then resume BID dosing. Pt has been seen by ID clinic in the past, so could consider re-referral (see Dr. Zenaida Niece Dam's note from 06/18/2011; further work-up considerations mentioned but unclear if any further work-up was done).

## 2012-11-26 ENCOUNTER — Telehealth: Payer: Self-pay | Admitting: Family Medicine

## 2012-11-26 ENCOUNTER — Other Ambulatory Visit: Payer: Self-pay | Admitting: *Deleted

## 2012-11-26 MED ORDER — HYDROCHLOROTHIAZIDE 25 MG PO TABS: 25.0000 mg | ORAL_TABLET | Freq: Every day | ORAL | Status: DC

## 2012-11-26 MED ORDER — LISINOPRIL 10 MG PO TABS: 10.0000 mg | ORAL_TABLET | Freq: Every day | ORAL | Status: DC

## 2012-11-26 NOTE — Telephone Encounter (Signed)
Called patient to let her know the medications were sent in, she is waiting to here form Dr Casper Harrison about her wet prep results.Janice Ramos, Rodena Medin

## 2012-11-26 NOTE — Telephone Encounter (Signed)
Wet prep does not definitely show any infection, and her urine culture results aren't back. Probably will be back over the weekend. I didn't refill her medications because they weren't mentioned yesterday; I don't see a reason for them not to be refilled, but I'll defer any further refills to Dr. Fara Boros (pt's PCP); she may want to see pt back for a visit before filling any medications. She should hear from someone early next week if she should start taking an antibiotic. Thanks! --CMS

## 2012-11-26 NOTE — Telephone Encounter (Signed)
Patient was in yesterday and saw Dr. Casper Harrison and thought he was going to refill her Lisinopril and HCTZ.  She is completely out and would [facility] [address].

## 2012-11-27 ENCOUNTER — Telehealth: Payer: Self-pay | Admitting: Family Medicine

## 2012-11-27 LAB — CULTURE, URINE COMPREHENSIVE
Colony Count: NO GROWTH
Organism ID, Bacteria: NO GROWTH

## 2012-11-27 NOTE — Telephone Encounter (Signed)
Please call pt to inform her that her urine culture is negative, so I don't recommend any antibiotic at this time. Pt has an appointment scheduled to see Dr. Fara Boros on 4/2. Thanks! --CMS

## 2012-12-02 ENCOUNTER — Ambulatory Visit (INDEPENDENT_AMBULATORY_CARE_PROVIDER_SITE_OTHER): Payer: Medicare Other | Admitting: Family Medicine

## 2012-12-02 ENCOUNTER — Encounter: Payer: Self-pay | Admitting: Family Medicine

## 2012-12-02 VITALS — BP 159/78 | HR 81 | Temp 98.1°F | Ht 62.0 in | Wt 158.4 lb

## 2012-12-02 DIAGNOSIS — S86919A Strain of unspecified muscle(s) and tendon(s) at lower leg level, unspecified leg, initial encounter: Secondary | ICD-10-CM | POA: Insufficient documentation

## 2012-12-02 DIAGNOSIS — IMO0002 Reserved for concepts with insufficient information to code with codable children: Secondary | ICD-10-CM

## 2012-12-02 DIAGNOSIS — S86911A Strain of unspecified muscle(s) and tendon(s) at lower leg level, right leg, initial encounter: Secondary | ICD-10-CM

## 2012-12-02 NOTE — Progress Notes (Signed)
Patient ID: Janice Ramos, female   DOB: [birth year], 72 y.o.   MRN: [id number]  [facility] Janyah Singleterry M. Sakai Heinle, MD Phone: [phone]   Subjective: HPI: Patient is a 72 y.o. female presenting to clinic today for ankle pain.  Right ankle pain- Had been inside for most of the last few months but went on a 40 min walk yesterday with some hills. At 1:00pm today she started having significant pain in her lower leg around her calf pain. No other trauma. No redness, no swelling. No new shoes. No past injury to this leg. Pain is sharp and she is able to draw a line around the bottom of her calf muscle where it hurts. Has not tried anything other than her regular ibuprofen. Pain is worse with walking, nothing is making it better. 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 Currently on Acyclovir for HSV outbreak.   History Reviewed: Non-smoker. Health Maintenance: UTD on what she chooses to get.  ROS: Please see HPI above.  Objective: Office vital signs reviewed. BP 159/78  Pulse 81  Temp(Src) 98.1 F (36.7 C) (Oral)  Ht 5\' 2"  (1.575 m)  Wt 158 lb 6.4 oz (71.85 kg)  BMI 28.96 kg/m2  Physical Examination:  General: Awake, alert. NAD Extremities: No edema of lower extremities. Lower legs are equal bilaterally. No redness or rash. No calf tenderness. Negative Homan's sign. FROM of ankle and good strength of ankle. TTP along lower medial border of calf and along shin without bruising or skin changes. Back: Able to bend forward with no problem. No TTP of spine or hips. Neuro: Grossly intact. Negative straight leg raise.   Assessment: 72 y.o. female with leg pain likely secondary to muscle strain.  Plan: See Problem List and After Visit Summary

## 2012-12-02 NOTE — Patient Instructions (Signed)
Muscle Strain °Muscle strain occurs when a muscle is stretched beyond its normal length. A small number of muscle fibers generally are torn. This is especially common in athletes. This happens when a sudden, violent force placed on a muscle stretches it too far. Usually, recovery from muscle strain takes 1 to 2 weeks. Complete healing will take 5 to 6 weeks.  °HOME CARE INSTRUCTIONS  °· While awake, apply ice to the sore muscle for the first 2 days after the injury. °· Put ice in a plastic bag. °· Place a towel between your skin and the bag. °· Leave the ice on for 15 to 20 minutes each hour. °· Do not use the strained muscle for several days, until you no longer have pain. °· You may wrap the injured area with an elastic bandage for comfort. Be careful not to wrap it too tightly. This may interfere with blood circulation or increase swelling. °· Only take over-the-counter or prescription medicines for pain, discomfort, or fever as directed by your caregiver. °SEEK MEDICAL CARE IF:  °You have increasing pain or swelling in the injured area. °MAKE SURE YOU:  °· Understand these instructions. °· Will watch your condition. °· Will get help right away if you are not doing well or get worse. °Document Released: 08/25/2005 Document Revised: 11/17/2011 Document Reviewed: 09/06/2011 °[employer]® Patient Information ©2013 [employer]. ° °

## 2012-12-02 NOTE — Assessment & Plan Note (Signed)
No red flag symptoms. Does not appear to be DVT or sciatic pain. Shingles less likely while she is on Acyclovir. Most likely muscle strain from increased activity. Encouraged to continue ibuprofen. Can use muscle rubs and heat/ice for relief. Should slowly increase her exercise. RTC if anything worsens or if she fails to improve.Marland Kitchen

## 2012-12-08 ENCOUNTER — Ambulatory Visit: Payer: Medicare Other | Admitting: Family Medicine

## 2012-12-15 ENCOUNTER — Encounter: Payer: Self-pay | Admitting: Family Medicine

## 2012-12-15 ENCOUNTER — Ambulatory Visit (INDEPENDENT_AMBULATORY_CARE_PROVIDER_SITE_OTHER): Payer: Medicare Other | Admitting: Family Medicine

## 2012-12-15 VITALS — BP 138/87 | HR 72 | Temp 97.7°F | Ht 62.0 in | Wt 161.0 lb

## 2012-12-15 DIAGNOSIS — R32 Unspecified urinary incontinence: Secondary | ICD-10-CM

## 2012-12-15 DIAGNOSIS — I10 Essential (primary) hypertension: Secondary | ICD-10-CM

## 2012-12-15 DIAGNOSIS — R21 Rash and other nonspecific skin eruption: Secondary | ICD-10-CM

## 2012-12-15 MED ORDER — TRIAMCINOLONE ACETONIDE 0.05 % EX OINT: 1.0000 "application " | TOPICAL_OINTMENT | Freq: Two times a day (BID) | CUTANEOUS | Status: DC

## 2012-12-15 NOTE — Progress Notes (Signed)
S: Pt comes in today for follow up.  Has a long list of concerns.   HYPERTENSION BP: 138/87 Meds: lisino 10, HCTZ 25 Taking meds: Yes     # of doses missed/week: 0 Symptoms: Headache: No Dizziness: No Vision changes: No SOB:  No Chest pain: No LE swelling: No Tobacco use: No  Other concerns: Pain medicine for herpes headaches (hydrocodone); #10 given 06/2012   Urinary incontinence- leaking, getting wet, getting worse; is not every day or all of the time; worse with emotional stress; does not necessarily have an urge, will sometimes just wet herself without any warning- most recently happened 1.5 weeks ago, usually happens once every 4-5 days; other times she feels like she has to go all of the time even when only a little comes out- most recently 1.5 weeks ago, feels like this 65% of the time; does NOT want a medication for this, is more interested in a referral to Uro   Patches of itching/burning on skin- on back, bottom, down legs, on face; wondering if it is fungal; was dropped by her Dermatologist b/c of insurance changes; her eye doctor thinks she has Sjogren's syndrome; starts with extreme itching then leaves skin a little discolored (white); lesions start red then fade out; has tried antifungal, tried hydrocortisone- didn't help; uses target body cream   Psych- [facility] is changing her medications around   ROS: Per HPI  History  Smoking status  . Never Smoker   Smokeless tobacco  . Never Used    O:  Filed Vitals:   12/15/12 1110  BP: 138/87  Pulse: 72  Temp: 97.7 F (36.5 C)    Gen: NAD CV: RRR, no murmur Pulm: CTA bilat, no wheezes or crackles Ext: Warm, no edema Skin: no rash appreciated, despite pt pointing out spots/lesions; some dry skin noted on back   A/P: 72 y.o. female p/w HTN, dry skin, urinary incontinence  -See problem list -f/u in 1 month

## 2012-12-15 NOTE — Assessment & Plan Note (Signed)
Pt does not want to try anticholinergic medication- would prefer uro referral for potential urodynamic studies. Referral precepted with Dr. Mauricio Po.

## 2012-12-15 NOTE — Assessment & Plan Note (Signed)
Appears to be dry skin vs eczema.  Advised moisturizer and topical steroid.

## 2012-12-15 NOTE — Assessment & Plan Note (Signed)
Well controlled today, no changes.    BP Readings from Last 3 Encounters:  12/15/12 138/87  12/02/12 159/78  11/25/12 138/79

## 2012-12-15 NOTE — Patient Instructions (Signed)
It was nice to see you today.  Your blood pressure looks good today.  I am going to send you to the Urology doctors to work up your incontinence issues.  I am also sending in a stronger topical medicine for your rash.  I also want you to get a stronger moisturizer--  Use something like CeraVe, which is a nice, thick moisturizer.  Come back to see me in a month or so and we will see how you are doing.

## 2012-12-17 ENCOUNTER — Telehealth: Payer: Self-pay | Admitting: Family Medicine

## 2012-12-17 NOTE — Telephone Encounter (Signed)
Called pt. She reports that she has a genital herpes out break and Dr.McGill gives her a very small amount of hydrocodone for the pain. She request a refill to be [facility] [address]. She reports, that the new outbreak of the genital herpes started 3 days ago and would very appreciate if Dr.McGill would refill the pain medications. She takes the zovirax and it is helping. Thank you. Fwd. To Dr.McGill .Arlyss Repress

## 2012-12-17 NOTE — Telephone Encounter (Signed)
Patient is calling because a small amount of Hydrocodone was to be sent to her pharmacy for her headaches, but the pharmacy did not received them.

## 2012-12-20 MED ORDER — HYDROCODONE-ACETAMINOPHEN 5-325 MG PO TABS: 1.0000 | ORAL_TABLET | Freq: Four times a day (QID) | ORAL | Status: DC | PRN

## 2012-12-20 NOTE — Telephone Encounter (Signed)
Called patient to inform her that her medication prescription is ready for pick up. Ancil Boozer, SMA

## 2012-12-20 NOTE — Telephone Encounter (Signed)
Printed. Pt can come pick up Rx for pain medication at her convenience.

## 2013-01-22 ENCOUNTER — Other Ambulatory Visit: Payer: Self-pay | Admitting: Family Medicine

## 2013-02-23 ENCOUNTER — Ambulatory Visit (INDEPENDENT_AMBULATORY_CARE_PROVIDER_SITE_OTHER): Payer: Medicare Other | Admitting: *Deleted

## 2013-02-23 DIAGNOSIS — Z111 Encounter for screening for respiratory tuberculosis: Secondary | ICD-10-CM

## 2013-02-23 NOTE — Progress Notes (Signed)
Tuberculin skin test applied to right ventral forearm. Pt will return Friday to have test read. Wyatt Haste, RN-BSN

## 2013-04-20 ENCOUNTER — Telehealth: Payer: Self-pay | Admitting: *Deleted

## 2013-04-20 NOTE — Telephone Encounter (Signed)
Pt reports that she is no longer diabetic and check up is not needed. Wyatt Haste, RN-BSN

## 2013-05-22 ENCOUNTER — Other Ambulatory Visit: Payer: Self-pay | Admitting: Family Medicine

## 2013-05-23 NOTE — Telephone Encounter (Signed)
Pt notified to please schedule an appt.  Raelene Trew, Darlyne Russian, CMA

## 2013-05-23 NOTE — Telephone Encounter (Signed)
Refilled HTN meds, needs appt.   Murtis Sink, MD [facility] Resident, PGY-2 05/23/2013, 11:24 AM

## 2013-06-10 ENCOUNTER — Encounter: Payer: Self-pay | Admitting: Family Medicine

## 2013-06-10 ENCOUNTER — Ambulatory Visit (INDEPENDENT_AMBULATORY_CARE_PROVIDER_SITE_OTHER): Payer: Medicare Other | Admitting: Family Medicine

## 2013-06-10 VITALS — BP 128/79 | HR 81 | Ht 62.5 in | Wt 162.0 lb

## 2013-06-10 DIAGNOSIS — G5700 Lesion of sciatic nerve, unspecified lower limb: Secondary | ICD-10-CM

## 2013-06-10 DIAGNOSIS — G5701 Lesion of sciatic nerve, right lower limb: Secondary | ICD-10-CM | POA: Insufficient documentation

## 2013-06-10 MED ORDER — MELOXICAM 15 MG PO TABS: 15.0000 mg | ORAL_TABLET | Freq: Every day | ORAL | Status: DC

## 2013-06-10 MED ORDER — HYDROCODONE-ACETAMINOPHEN 10-325 MG PO TABS: 0.5000 | ORAL_TABLET | Freq: Four times a day (QID) | ORAL | Status: DC | PRN

## 2013-06-10 NOTE — Patient Instructions (Addendum)
Ms. Milo,  Thank you for coming in to see me today. Am sorry to hear that you have been having  trouble with your herpes and a right gluteal and hip pain.   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  1. To treat inflammation: ice pack, once daily mobic.  2. Stretch muscle- rehab exercise 2-3 times per day.  3. Treat severe pain- 1/2 to 1 whole vicodin tab every 6-8 hrs  as needed for the next 3-5 days.   If your pain is not slowly improving with the above treatment plan please call and schedule follow up with Dr. Ermalinda Memos to determine if imaging or formal physical therapy is needed.  Dr. Armen Pickup

## 2013-06-13 NOTE — Progress Notes (Signed)
  Subjective:    Patient ID: Janice Ramos, female    DOB: 04-10-1941, 72 y.o.   MRN: [id number]  HPI 72 year old female with a history of mood disorder and genital herpes, recent outbreak,  presents for same-day visit discuss the following:  #2 weeks of right hip pain: Patient reports 2 weeks of persistent right hip pain that starts in her right gluteal radiates inferiorly down to just above the popliteal fossa. She denies preceding injury. She has had pain like this previously about 2 years ago. She denies fever, fecal/urinary incontinence, groin numbness, lower extremity weakness. She has tried ibuprofen 800 mg 3 times daily, Aleve, half tablet of hydrocodone with only partial to per relief of symptoms.   Social history: Patient drinks wine with dinner. Patient is a nonsmoker.  Review of Systems As per HPI     Objective:   Physical Exam BP 128/79  Pulse 81  Ht 5' 2.5" (1.588 m)  Wt 162 lb (73.483 kg)  BMI 29.14 kg/m2 General appearance: alert, cooperative and no distress Back Exam: Back: Normal Curvature, no deformities or CVA tenderness  Paraspinal Tenderness: absent R gluteal tenderness: area of SI joint/piriformis  LE Strength 5/5  LE Sensation: in tact  LE Reflexes 2+ and symmetric  Straight leg raise: negative. Pain is reproduced and radiates down posteriorly to popliteal fossa.     Assessment & Plan:

## 2013-07-19 ENCOUNTER — Ambulatory Visit: Payer: Medicare Other | Admitting: Family Medicine

## 2013-09-16 ENCOUNTER — Ambulatory Visit: Payer: Medicare Other | Admitting: Family Medicine

## 2013-10-10 ENCOUNTER — Ambulatory Visit (INDEPENDENT_AMBULATORY_CARE_PROVIDER_SITE_OTHER): Payer: Medicare Other | Admitting: Family Medicine

## 2013-10-10 ENCOUNTER — Ambulatory Visit
Admission: RE | Admit: 2013-10-10 | Discharge: 2013-10-10 | Disposition: A | Discharge status: Home / Self Care | Payer: Medicare Other | Source: Ambulatory Visit | Attending: Family Medicine | Admitting: Family Medicine

## 2013-10-10 ENCOUNTER — Encounter: Payer: Self-pay | Admitting: Family Medicine

## 2013-10-10 VITALS — BP 154/87 | HR 81 | Temp 98.7°F | Ht 62.0 in | Wt 159.0 lb

## 2013-10-10 DIAGNOSIS — M25551 Pain in right hip: Secondary | ICD-10-CM

## 2013-10-10 DIAGNOSIS — M25559 Pain in unspecified hip: Secondary | ICD-10-CM

## 2013-10-10 DIAGNOSIS — I10 Essential (primary) hypertension: Secondary | ICD-10-CM

## 2013-10-10 LAB — BASIC METABOLIC PANEL
BUN: 14 mg/dL (ref 6–23)
CALCIUM: 9.3 mg/dL (ref 8.4–10.5)
CO2: 33 meq/L — AB (ref 19–32)
CREATININE: 0.87 mg/dL (ref 0.50–1.10)
Chloride: 95 mEq/L — ABNORMAL LOW (ref 96–112)
Glucose, Bld: 85 mg/dL (ref 70–99)
Potassium: 4 mEq/L (ref 3.5–5.3)
SODIUM: 133 meq/L — AB (ref 135–145)

## 2013-10-10 MED ORDER — IBUPROFEN 800 MG PO TABS: 800.0000 mg | ORAL_TABLET | Freq: Three times a day (TID) | ORAL | Status: DC | PRN

## 2013-10-10 NOTE — Patient Instructions (Signed)
It was great to meet you!  Please come back in 2-4 week sto follow up for your pain and your chronic medical conditions.   You will be called by physical therapy  Try ice for 15 minutes up to every 2 hours as needed

## 2013-10-10 NOTE — Assessment & Plan Note (Addendum)
Chronic and progressive now for 4 months. Did consider greater trochanteric bursitis however her shooting pain down the back of her leg is more consistent with a radiculopathy I'm concerned that she's having a hard time walking and cooking for herself now because of the severe pain. I think she may need some sort of assisted device of sent her for an urgent referral to PT Will check plain films of right hip and low back today  If his films show any abnormalities her low back or right hip I will readily send her to orthopedic surgery for evaluation and further treatment Will check renal function with high ibuprofen use, COntinue ibuprofen for now, discussed correct dosing .

## 2013-10-10 NOTE — Assessment & Plan Note (Signed)
Elevated today, possibly due to pain Will cionsider increasing lisinopril on f/u if still elevated, continue HCTZ na d lisinopril at current doses for now.

## 2013-10-10 NOTE — Progress Notes (Signed)
Patient ID: Janice Ramos, female   DOB: [birth year], 73 y.o.   MRN: [id number]  Kevin Fenton, MD Phone: [phone]  Subjective:  Chief complaint-noted  Followup for right hip pain Patient states that this hip pain is significantly different than the pain she was seen for last October. She does report a four-month history of progressive right hip pain described as electric type pain shooting down the back of her leg to her popliteal fossa originates around her greater trochanter on the right.  She has severe pain with walking states that her pain is 8/10 in severity, and that frequently brings her to tears. It's limiting her ability to take care of herself and that she's having a hard time standing to cook.  States her blood pressure is usually run 120-130/80s diastolic, denies frequent headache that has one today. Denies chest pain and dyspnea. Takes her medications regularly.   She denies bowel or bladder dysfunction, saddle anesthesia, and lower extremity weakness  ROS- No fever, chills, sweats No dyspnea  No chest pain Positive headache No rash   Past Medical History Patient Active Problem List   Diagnosis Date Noted  . Right hip pain 10/10/2013  . Piriformis syndrome of right side 06/10/2013  . Muscle strain of lower leg 12/02/2012  . Dysuria 11/25/2012  . Impetigo 11/04/2012  . Influenza-like syndrome 07/01/2012  . Labial irritation 04/27/2012  . Syncopal episodes 04/06/2012  . Headache 12/10/2011  . Wrist pain, chronic 11/17/2011  . Thumb pain 08/06/2011  . Trochanteric bursitis 06/22/2011  . 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 Rash 05/24/2011  . Fatigue 03/26/2011  . Weight loss 03/26/2011  . Pelvic pain in female 12/17/2010  . Abdominal pain 12/17/2010  . Urinary incontinence 11/12/2010  . GENITAL HERPES 08/05/2010  . INSOMNIA 01/26/2009  .  DIABETES MELLITUS, TYPE II 12/15/2008  . DEPRESSION, MAJOR 05/17/2008  . ESSENTIAL HYPERTENSION, BENIGN 05/17/2008  . HYPERCHOLESTEROLEMIA 11/05/2006  . BIPOLAR DISORDER 11/05/2006    Medications- reviewed and updated Current Outpatient Prescriptions  Medication Sig Dispense Refill  . acyclovir (ZOVIRAX) 800 MG tablet Take 800 mg by mouth 4 (four) times daily. Increased by eye doctor      . ALPRAZolam (XANAX) 0.5 MG tablet Take 0.5 mg by mouth at bedtime as needed. For insomnia/anxiety       . ARIPiprazole (ABILIFY) 10 MG tablet Take 10 mg by mouth at bedtime. Rx'ed by [facility]      . hydrochlorothiazide (HYDRODIURIL) 25 MG tablet Take 1 tablet (25 mg total) by mouth daily.  30 tablet  11  . HYDROcodone-acetaminophen (NORCO) 10-325 MG per tablet Take 0.5-1 tablets by mouth every 6 (six) hours as needed (severe R sided hip pain).  15 tablet  0  . hydrOXYzine (ATARAX/VISTARIL) 25 MG tablet Take 1 tablet (25 mg total) by mouth at bedtime as needed for itching.  30 tablet  0  . ibuprofen (ADVIL,MOTRIN) 800 MG tablet Take 1 tablet (800 mg total) by mouth every 8 (eight) hours as needed.  30 tablet  0  . lisinopril (PRINIVIL,ZESTRIL) 10 MG tablet Take 1 tablet (10 mg total) by mouth daily.  30 tablet  11  . meloxicam (MOBIC) 15 MG tablet Take 1 tablet (15 mg total) by mouth daily.  21 tablet  0  . mupirocin ointment (BACTROBAN) 2 % Apply topically 3 (three) times daily.  22 g  0  .  traZODone (DESYREL) 50 MG tablet Take 50 mg by mouth at bedtime. Per [facility]       . TRIAMCINOLONE ACETONIDE, TOP, 0.05 % OINT Apply 1 application topically 2 (two) times daily.  85 g  1  . venlafaxine (EFFEXOR-XR) 150 MG 24 hr capsule Take 300 mg by mouth daily. Per [facility] [address]        No current facility-administered medications for this visit.    Objective: BP 150/85  Pulse 91  Temp(Src) 98.7 F (37.1 C) (Oral)  Ht 5\' 2"  (1.575 m)  Wt 159 lb (72.122 kg)  BMI 29.07  kg/m2 Gen: NAD, alert, cooperative with exam HEENT: NCAT Ext: No edema Neuro: Alert and oriented, Strength 4/5 on RLE compared to 5/5 on L. 2+ achilles and patellar reflexes, Gait slow and with limp to the R MSK: NO significant pain on R with log roll, little pain with full flexion of R hip but pain reproduced (electric down the back of leg ) with straight leg raise   Assessment/Plan:  Right hip pain Chronic and progressive now for 4 months. Did consider greater trochanteric bursitis however her shooting pain down the back of her leg is more consistent with a radiculopathy I'm concerned that she's having a hard time walking and cooking for herself now because of the severe pain. I think she may need some sort of assisted device of sent her for an urgent referral to PT Will check plain films of right hip and low back today  If his films show any abnormalities her low back or right hip I will readily send her to orthopedic surgery for evaluation and further treatment    Orders Placed This Encounter  Procedures  . DG Lumbar Spine Complete    Standing Status: Future     Number of Occurrences:      Standing Expiration Date: 12/09/2014    Order Specific Question:  Reason for Exam (SYMPTOM  OR DIAGNOSIS REQUIRED)    Answer:  R hip/back pain    Order Specific Question:  Preferred imaging location?    Answer:  [facility]  . DG Hip Complete Right    Standing Status: Future     Number of Occurrences:      Standing Expiration Date: 12/09/2014    Order Specific Question:  Reason for Exam (SYMPTOM  OR DIAGNOSIS REQUIRED)    Answer:  R hip/back pain    Order Specific Question:  Preferred imaging location?    Answer:  [facility]  . Basic Metabolic Panel  . Ambulatory referral to Physical Therapy    Referral Priority:  Urgent    Referral Type:  Physical Medicine    Referral Reason:  Specialty Services Required    Requested Specialty:  Physical Therapy    Number of  Visits Requested:  1    Meds ordered this encounter  Medications  . ibuprofen (ADVIL,MOTRIN) 800 MG tablet    Sig: Take 1 tablet (800 mg total) by mouth every 8 (eight) hours as needed.    Dispense:  30 tablet    Refill:  0

## 2013-10-11 ENCOUNTER — Telehealth: Payer: Self-pay | Admitting: Family Medicine

## 2013-10-11 NOTE — Telephone Encounter (Signed)
Called to discuss x rays  There are no acute findings on her xrays. I think she needs urgent physical therapy which I ordered yesterday. I will also consider eferral to sports med or orthopedic if her symptoms do not improve significantly.   Murtis SinkSam Chip Canepa, MD [facility] Resident, PGY-2 10/11/2013, 5:14 PM

## 2013-10-11 NOTE — Telephone Encounter (Signed)
Pt called and would like someone to call her with the results from the X rays done on 2/2. jw

## 2013-10-11 NOTE — Telephone Encounter (Signed)
Attempted phone call.  No answer.  Lamarkus Nebel L, CMA  

## 2013-10-12 ENCOUNTER — Telehealth: Payer: Self-pay | Admitting: Family Medicine

## 2013-10-12 DIAGNOSIS — M25551 Pain in right hip: Secondary | ICD-10-CM

## 2013-10-12 NOTE — Telephone Encounter (Signed)
Will fwd to MD.  Elgin Carn L, CMA  

## 2013-10-12 NOTE — Telephone Encounter (Signed)
Patient calls stating that her pain in her upper leg is getting worse. She is unable to function due to pain and she states that the pain is all she can think about. Please be advised.

## 2013-10-13 NOTE — Telephone Encounter (Signed)
[facility] Monday 10/17/2013 at 2:45pm.  Pt notified.  Kourtlynn Trevor, Darlyne RussianKristen L, CMA

## 2013-10-13 NOTE — Telephone Encounter (Signed)
Considering worsening severe pain that appears to be chronic and no acute findings on her xrays and radicular symptoms with mild weakness I think its most appropriate to go ahead and pursue MRI of her lumbar spine.   Because of the chronicity of her pain I don't think opiate medications are appropriate at this time.   Discussed with Dr. Jennette KettleNeal.   Called but no answer, left VM.  Murtis SinkSam Adriella Essex, MD [facility] Resident, PGY-2 10/13/2013, 8:34 AM

## 2013-10-17 ENCOUNTER — Ambulatory Visit (HOSPITAL_COMMUNITY)
Admission: RE | Admit: 2013-10-17 | Discharge: 2013-10-17 | Disposition: A | Discharge status: Home / Self Care | Payer: Medicare Other | Source: Ambulatory Visit | Attending: Family Medicine | Admitting: Family Medicine

## 2013-10-17 DIAGNOSIS — N3289 Other specified disorders of bladder: Secondary | ICD-10-CM | POA: Insufficient documentation

## 2013-10-17 DIAGNOSIS — M51379 Other intervertebral disc degeneration, lumbosacral region without mention of lumbar back pain or lower extremity pain: Secondary | ICD-10-CM | POA: Insufficient documentation

## 2013-10-17 DIAGNOSIS — M25551 Pain in right hip: Secondary | ICD-10-CM

## 2013-10-17 DIAGNOSIS — M5137 Other intervertebral disc degeneration, lumbosacral region: Secondary | ICD-10-CM | POA: Insufficient documentation

## 2013-10-18 ENCOUNTER — Telehealth: Payer: Self-pay | Admitting: Family Medicine

## 2013-10-18 NOTE — Telephone Encounter (Signed)
Follow up from MRI lumbar spine,   No acute findingsto her lumbar spine but she does have a mod to severe distended bladder. This could be responsible for her pain however it does seem an atypical presentation.   I will ask our staff to call and discuss this with her and request she come in for a same day if she is having difficulty urinating.   On first glance she is on at least 2 drugs that could cause urinary retention, trazodone adn abilify. If she is on hydroxizine still of course that could cause it as well.   I do not believe she has a bladder outlet obstruction because she denied bladder dysfunction in our visit but she may be retaining too much causing her pain.   Murtis SinkSam Bradshaw, MD [facility] Resident, PGY-2 10/18/2013, 9:40 PM

## 2013-10-19 ENCOUNTER — Telehealth: Payer: Self-pay | Admitting: Family Medicine

## 2013-10-19 NOTE — Telephone Encounter (Signed)
MRI done on Monday, patient would like results for testing. Please call.

## 2013-10-19 NOTE — Telephone Encounter (Signed)
Will fwd to MD.  Pride Gonzales L, CMA  

## 2013-10-20 NOTE — Telephone Encounter (Signed)
Pt coming in 10/21/2013 at 1:30 to see Dr. Ermalinda MemosBradshaw.  Alexandre Faries, Darlyne RussianKristen L, CMA

## 2013-10-20 NOTE — Telephone Encounter (Signed)
See previous phone note, called but patient did not answer.   Murtis SinkSam Bradshaw, MD [facility] Resident, PGY-2 10/20/2013, 8:25 AM

## 2013-10-20 NOTE — Telephone Encounter (Signed)
LMVM for patient to return my call.  Evgenia Merriman, Darlyne RussianKristen L, CMA

## 2013-10-20 NOTE — Telephone Encounter (Signed)
Pt has appt 10/21/2013 at 1:30 to see Dr. Ermalinda MemosBradshaw.  Keiera Strathman, Darlyne RussianKristen L, CMA

## 2013-10-21 ENCOUNTER — Encounter: Payer: Self-pay | Admitting: Family Medicine

## 2013-10-21 ENCOUNTER — Ambulatory Visit (INDEPENDENT_AMBULATORY_CARE_PROVIDER_SITE_OTHER): Payer: Medicare Other | Admitting: Family Medicine

## 2013-10-21 VITALS — BP 158/81 | HR 82 | Temp 98.1°F | Ht 62.0 in | Wt 158.0 lb

## 2013-10-21 DIAGNOSIS — G5701 Lesion of sciatic nerve, right lower limb: Secondary | ICD-10-CM

## 2013-10-21 DIAGNOSIS — G5700 Lesion of sciatic nerve, unspecified lower limb: Secondary | ICD-10-CM

## 2013-10-21 DIAGNOSIS — M25551 Pain in right hip: Secondary | ICD-10-CM

## 2013-10-21 DIAGNOSIS — M25559 Pain in unspecified hip: Secondary | ICD-10-CM

## 2013-10-21 MED ORDER — HYDROCODONE-ACETAMINOPHEN 10-325 MG PO TABS: 0.5000 | ORAL_TABLET | Freq: Four times a day (QID) | ORAL | Status: DC | PRN

## 2013-10-21 MED ORDER — METHYLPREDNISOLONE ACETATE 40 MG/ML IJ SUSP
40.0000 mg | Freq: Once | INTRAMUSCULAR | Status: AC
  Administered 2013-10-21: 40 mg via INTRAMUSCULAR

## 2013-10-21 NOTE — Patient Instructions (Signed)
It was great to see you today, I think the injection will help your pain a lot.   PLease try out the exercises and stretches.   Come back in 4 weeks for follow up, of course sooner if you are getting worse.

## 2013-10-21 NOTE — Assessment & Plan Note (Addendum)
On reexamination this pain seems to be more clearly greater trochanteric bursitis with pain directly over the greater trochanter with palpation and some radiation of pain in the IT band distribution Injected with steroids as described in the note Given 15 hydrocodone for pain, considered tramadol decided against it as she is on Effexor and Abilify. Encouraged ice Also given handout for stretches Followup one month

## 2013-10-21 NOTE — Progress Notes (Signed)
Patient ID: Janice Ramos, female   DOB: Jun 08, 1941, 73 y.o.   MRN: [id number]  Kevin Fenton, MD Phone: [phone]  Subjective:  Chief complaint-noted  # Followup for right hip pain  Patient states that since our last visit her hip pain has become slightly better. She describes it as a 7/10 right hip pain starting around the area of her greater trochanter going down to her lateral knee in a line like distribution. She states the pain is also better when she is sitting and worsens with walking. She feels that it's affecting her walking and impairing her ability to take care of herself during the day.  She denies overt weakness in the affected limb, fever, chills.  ROS- Per HPI  Past Medical History Patient Active Problem List   Diagnosis Date Noted  . Right hip pain 10/10/2013  . Piriformis syndrome of right side 06/10/2013  . Dysuria 11/25/2012  . Syncopal episodes 04/06/2012  . Headache 12/10/2011  . Wrist pain, chronic 11/17/2011  . Thumb pain 08/06/2011  . Trochanteric bursitis 06/22/2011  . 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 Fatigue 03/26/2011  . Weight loss 03/26/2011  . Pelvic pain in female 12/17/2010  . Abdominal pain 12/17/2010  . Urinary incontinence 11/12/2010  . GENITAL HERPES 08/05/2010  . INSOMNIA 01/26/2009  . DIABETES MELLITUS, TYPE II 12/15/2008  . DEPRESSION, MAJOR 05/17/2008  . ESSENTIAL HYPERTENSION, BENIGN 05/17/2008  . HYPERCHOLESTEROLEMIA 11/05/2006  . BIPOLAR DISORDER 11/05/2006    Medications- reviewed and updated Current Outpatient Prescriptions  Medication Sig Dispense Refill  . acyclovir (ZOVIRAX) 800 MG tablet Take 800 mg by mouth 4 (four) times daily. Increased by eye doctor      . ALPRAZolam (XANAX) 0.5 MG tablet Take 0.5 mg by mouth at bedtime as needed. For insomnia/anxiety       . ARIPiprazole (ABILIFY) 10 MG tablet  Take 10 mg by mouth at bedtime. Rx'ed by [facility]      . hydrochlorothiazide (HYDRODIURIL) 25 MG tablet Take 1 tablet (25 mg total) by mouth daily.  30 tablet  11  . HYDROcodone-acetaminophen (NORCO) 10-325 MG per tablet Take 0.5-1 tablets by mouth every 6 (six) hours as needed (severe R sided hip pain).  15 tablet  0  . hydrOXYzine (ATARAX/VISTARIL) 25 MG tablet Take 1 tablet (25 mg total) by mouth at bedtime as needed for itching.  30 tablet  0  . ibuprofen (ADVIL,MOTRIN) 800 MG tablet Take 1 tablet (800 mg total) by mouth every 8 (eight) hours as needed.  30 tablet  0  . lisinopril (PRINIVIL,ZESTRIL) 10 MG tablet Take 1 tablet (10 mg total) by mouth daily.  30 tablet  11  . meloxicam (MOBIC) 15 MG tablet Take 1 tablet (15 mg total) by mouth daily.  21 tablet  0  . mupirocin ointment (BACTROBAN) 2 % Apply topically 3 (three) times daily.  22 g  0  . traZODone (DESYREL) 50 MG tablet Take 50 mg by mouth at bedtime. Per [facility]       . TRIAMCINOLONE ACETONIDE, TOP, 0.05 % OINT Apply 1 application topically 2 (two) times daily.  85 g  1  . venlafaxine (EFFEXOR-XR) 150 MG 24 hr capsule Take 300 mg by mouth daily. Per [facility] on [address]        No current facility-administered medications for this visit.    Objective: BP  158/81  Pulse 82  Temp(Src) 98.1 F (36.7 C) (Oral)  Ht 5\' 2"  (1.575 m)  Wt 158 lb (71.668 kg)  BMI 28.89 kg/m2 Gen: NAD, alert, cooperative with exam HEENT: NCAT Ext: No edema, warm MSK: Right hip with 5/5 strength compared to left, pain on lateral hip over greater trochanter with fabers and log roll test. No referred pain to groin, Neuro: Alert and oriented, strength 5/5 and sensation intact in bilateral lower extremities, slight limping gait.  Left greater trochanter injection  Informed consent obtained and placed in chart.  Time out performed.  Area cleaned with iodine x 3 and wiped clear with alcohol swab.  Using 21 1/2 gauge  needle 1 cc Depo-Medrol and 4 cc's 1% Lidocaine were injected at the site of tenderness over the greater trochanter.  Sterile bandage placed.  Patient tolerated procedure well.  No complications.    Assessment/Plan:  Right hip pain On reexamination this pain seems to be more clearly greater trochanteric bursitis with pain directly over the greater trochanter with palpation and some radiation of pain in the IT band distribution Injected with steroids as described in the note Given 15 hydrocodone for pain, considered tramadol decided against it as she is on Effexor and Abilify. Encouraged ice Also given handout for stretches Followup one month    Meds ordered this encounter  Medications  . HYDROcodone-acetaminophen (NORCO) 10-325 MG per tablet    Sig: Take 0.5-1 tablets by mouth every 6 (six) hours as needed (severe R sided hip pain).    Dispense:  15 tablet    Refill:  0  . methylPREDNISolone acetate (DEPO-MEDROL) injection 40 mg    Sig:

## 2014-01-04 ENCOUNTER — Ambulatory Visit (INDEPENDENT_AMBULATORY_CARE_PROVIDER_SITE_OTHER): Payer: Medicare Other | Admitting: Family Medicine

## 2014-01-04 ENCOUNTER — Encounter: Payer: Self-pay | Admitting: Family Medicine

## 2014-01-04 VITALS — BP 158/93 | HR 86 | Temp 98.6°F | Wt 158.0 lb

## 2014-01-04 DIAGNOSIS — E119 Type 2 diabetes mellitus without complications: Secondary | ICD-10-CM

## 2014-01-04 DIAGNOSIS — L301 Dyshidrosis [pompholyx]: Secondary | ICD-10-CM | POA: Insufficient documentation

## 2014-01-04 DIAGNOSIS — F319 Bipolar disorder, unspecified: Secondary | ICD-10-CM

## 2014-01-04 DIAGNOSIS — R21 Rash and other nonspecific skin eruption: Secondary | ICD-10-CM

## 2014-01-04 DIAGNOSIS — I10 Essential (primary) hypertension: Secondary | ICD-10-CM

## 2014-01-04 LAB — POCT GLYCOSYLATED HEMOGLOBIN (HGB A1C): Hemoglobin A1C: 5.8

## 2014-01-04 MED ORDER — TRIAMCINOLONE ACETONIDE 0.05 % EX OINT: 1.0000 "application " | TOPICAL_OINTMENT | Freq: Two times a day (BID) | CUTANEOUS | Status: DC

## 2014-01-04 NOTE — Progress Notes (Signed)
Patient ID: Janice Ramos, female   DOB: 11/20/1940, 73 y.o.   MRN: [id number]  Kevin FentonSamuel Makenna Macaluso, MD Phone: [phone]  Subjective:  Chief complaint-noted  # pt her to f/u HTN, pre-diabetes,  And hand rash  Hand rash HAs been there several months, No other contacts have it, some scattered dry "sores" on body elsewhere that resolve wuickly Worsening lately, assoc with burning, no otching or bleeding Washes hand 20+ times a day using dish detergent Also has Bipolar disorder,  Describes it as a compulsion  HSV-  Currently having an outbreak, Was planning pelvic today but would like to defer given outbreak, has already increased her acyclovir Having malaise and HA for 2 days since outbreak started  HTN Has missed meds X last 2 nights given her outbreak Normally compliant, no CP/dyspnea/palpitations - ROS- Per HPI  Past Medical History Patient Active Problem List   Diagnosis Date Noted  . 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 Right hip pain 10/10/2013  . Piriformis syndrome of right side 06/10/2013  . Dysuria 11/25/2012  . Syncopal episodes 04/06/2012  . Headache 12/10/2011  . Wrist pain, chronic 11/17/2011  . Thumb pain 08/06/2011  . Trochanteric bursitis 06/22/2011  . 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 Fatigue 03/26/2011  . Weight loss 03/26/2011  . Pelvic pain in female 12/17/2010  . Abdominal pain 12/17/2010  . Urinary incontinence 11/12/2010  . GENITAL HERPES 08/05/2010  . INSOMNIA 01/26/2009  . Pre-diabetes 12/15/2008  . DEPRESSION, MAJOR 05/17/2008  . ESSENTIAL HYPERTENSION, BENIGN 05/17/2008  . HYPERCHOLESTEROLEMIA 11/05/2006  . BIPOLAR DISORDER 11/05/2006    Medications- reviewed and updated Current Outpatient Prescriptions  Medication Sig Dispense Refill  . acyclovir (ZOVIRAX) 800 MG tablet Take 800 mg by mouth 4 (four) times daily.  Increased by eye doctor      . ALPRAZolam (XANAX) 0.5 MG tablet Take 0.5 mg by mouth at bedtime as needed. For insomnia/anxiety       . ARIPiprazole (ABILIFY) 10 MG tablet Take 10 mg by mouth at bedtime. Rx'ed by [facility]      . hydrochlorothiazide (HYDRODIURIL) 25 MG tablet Take 1 tablet (25 mg total) by mouth daily.  30 tablet  11  . HYDROcodone-acetaminophen (NORCO) 10-325 MG per tablet Take 0.5-1 tablets by mouth every 6 (six) hours as needed (severe R sided hip pain).  15 tablet  0  . hydrOXYzine (ATARAX/VISTARIL) 25 MG tablet Take 1 tablet (25 mg total) by mouth at bedtime as needed for itching.  30 tablet  0  . ibuprofen (ADVIL,MOTRIN) 800 MG tablet Take 1 tablet (800 mg total) by mouth every 8 (eight) hours as needed.  30 tablet  0  . lisinopril (PRINIVIL,ZESTRIL) 10 MG tablet Take 1 tablet (10 mg total) by mouth daily.  30 tablet  11  . meloxicam (MOBIC) 15 MG tablet Take 1 tablet (15 mg total) by mouth daily.  21 tablet  0  . mupirocin ointment (BACTROBAN) 2 % Apply topically 3 (three) times daily.  22 g  0  . traZODone (DESYREL) 50 MG tablet Take 50 mg by mouth at bedtime. Per [facility]       . TRIAMCINOLONE ACETONIDE, TOP, 0.05 % OINT Apply 1 application topically 2 (two) times daily.  85 g  1  . venlafaxine (EFFEXOR-XR) 150 MG 24 hr capsule Take 300 mg by mouth daily. Per [facility] on [address]        No current facility-administered medications for this visit.    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   ESSENTIAL HYPERTENSION, BENIGN Elevated today and last 2 visits when she was in pain States she hasnt taken meds today, will re-check in 1 month Continue lisinopril and HCTZ  Pre-diabetes A1C 5.8 today, I cant find lab evidence in chart to support DM2 dx so changed to pre-diabetes A1C previously 6.3 5 years ago Plan annual fasting bmp    Orders Placed This Encounter  Procedures  . POCT A1C    Meds ordered this encounter  Medications  . TRIAMCINOLONE ACETONIDE, TOP, 0.05 % OINT    Sig: Apply 1 application topically 2 (two) times daily.    Dispense:  85 g    Refill:  1

## 2014-01-04 NOTE — Assessment & Plan Note (Addendum)
A1C 5.8 today, I cant find lab evidence in chart to support DM2 dx so changed to pre-diabetes A1C previously 6.3 5 years ago Plan annual fasting bmp

## 2014-01-04 NOTE — Assessment & Plan Note (Signed)
Elevated today and last 2 visits when she was in pain States she hasnt taken meds today, will re-check in 1 month Continue lisinopril and HCTZ

## 2014-01-04 NOTE — Assessment & Plan Note (Signed)
Most likely, considered scabies but wioth compulsive handwashing I think this is most likely Tx with steroid and vaseline, discussed strategies to decrease handwashing.

## 2014-01-04 NOTE — Assessment & Plan Note (Signed)
Sees [facility] for several years Taking meds regularly, abilify and effexor, PRN xanax and every night

## 2014-01-04 NOTE — Patient Instructions (Signed)
Great to see you today!  I have prescribed a steroid ointment for your hands, also try vaseline twice daily, and try to cut back on your handwashing  Please remember to take you HTN meds and come back in 1 month for an annual exam and re-check HTN

## 2014-01-06 ENCOUNTER — Telehealth: Payer: Self-pay | Admitting: *Deleted

## 2014-01-06 DIAGNOSIS — R21 Rash and other nonspecific skin eruption: Secondary | ICD-10-CM

## 2014-01-06 MED ORDER — TRIAMCINOLONE ACETONIDE 0.05 % EX OINT: 1.0000 "application " | TOPICAL_OINTMENT | Freq: Two times a day (BID) | CUTANEOUS | Status: DC

## 2014-01-06 NOTE — Telephone Encounter (Signed)
Received call from [facility] regarding Trianex 0.05% ointment.  The 85 gm ointment is no longer available.  The 430 gram is available please update Rx if appropriate. Please resend new Rx or call [facility] [phone] or fax [phone].  Clovis Puamika L Martin, RN

## 2014-01-06 NOTE — Telephone Encounter (Signed)
Sent larger amount of triamcinolone per whats available.   Murtis SinkSam Christabella Alvira, MD [facility] Resident, PGY-2 01/06/2014, 4:25 PM

## 2014-03-13 ENCOUNTER — Ambulatory Visit: Payer: Medicare Other | Admitting: Family Medicine

## 2014-03-30 ENCOUNTER — Ambulatory Visit (INDEPENDENT_AMBULATORY_CARE_PROVIDER_SITE_OTHER): Payer: Medicare Other | Admitting: Family Medicine

## 2014-03-30 ENCOUNTER — Encounter: Payer: Self-pay | Admitting: Family Medicine

## 2014-03-30 VITALS — BP 121/76 | HR 90 | Temp 98.5°F | Wt 157.0 lb

## 2014-03-30 DIAGNOSIS — M25551 Pain in right hip: Secondary | ICD-10-CM

## 2014-03-30 DIAGNOSIS — B009 Herpesviral infection, unspecified: Secondary | ICD-10-CM

## 2014-03-30 DIAGNOSIS — M25559 Pain in unspecified hip: Secondary | ICD-10-CM

## 2014-03-30 DIAGNOSIS — I10 Essential (primary) hypertension: Secondary | ICD-10-CM

## 2014-03-30 MED ORDER — ACYCLOVIR 800 MG PO TABS: 800.0000 mg | ORAL_TABLET | Freq: Three times a day (TID) | ORAL | Status: DC

## 2014-03-30 NOTE — Patient Instructions (Signed)
Great to see you!  Lets follow up in about 3 months for blood pressure  Schedule your mammogram.   Hypertension Hypertension is another name for high blood pressure. High blood pressure forces your heart to work harder to pump blood. A blood pressure reading has two numbers, which includes a higher number over a lower number (example: 110/72). HOME CARE   Have your blood pressure rechecked by your doctor.  Only take medicine as told by your doctor. Follow the directions carefully. The medicine does not work as well if you skip doses. Skipping doses also puts you at risk for problems.  Do not smoke.  Monitor your blood pressure at home as told by your doctor. GET HELP IF:  You think you are having a reaction to the medicine you are taking.  You have repeat headaches or feel dizzy.  You have puffiness (swelling) in your ankles.  You have trouble with your vision. GET HELP RIGHT AWAY IF:   You get a very bad headache and are confused.  You feel weak, numb, or faint.  You get chest or belly (abdominal) pain.  You throw up (vomit).  You cannot breathe very well. MAKE SURE YOU:   Understand these instructions.  Will watch your condition.  Will get help right away if you are not doing well or get worse. Document Released: 02/11/2008 Document Revised: 08/30/2013 Document Reviewed: 06/17/2013 [facility] Patient Information 2015 [employer] [place]. This information is not intended to replace advice given to you by your health care provider. Make sure you discuss any questions you have with your health care provider.

## 2014-03-30 NOTE — Assessment & Plan Note (Signed)
Current flare per patient Explained 3 times a day dosing x2 days for treating flare And then 2 pills daily for suppression She is agreeable, given new Rx Followup as needed

## 2014-03-30 NOTE — Assessment & Plan Note (Signed)
Greater trochanter bursitis, current flare Injected with steroids as described in the note Encouraged stretches and exercises previously provided for IT band syndrome Follow up as needed

## 2014-03-30 NOTE — Progress Notes (Addendum)
Patient ID: Janice Ramos, female   DOB: Apr 27, 1941, 73 y.o.   MRN: [id number]  Kevin Fenton, MD Phone: [phone]  Subjective:  Chief complaint-noted  Pt Here for recurrent right hip pain  Patient states that she has severe right hip pain that was worse about 5 weeks ago that has improved quite a bit since then. She describes it as starting in her right lateral hip radiating down to her right ankle. Previously gave her a shot of steroids over her greater trochanter which improved her right hip pain significantly. She then developed pain and tightness radiating down to her right knee which I thought was IT band syndrome. She's been doing stretches intermittently with some improvement in the IT band syndrome.  Her pain was much worse 5 weeks ago but is still very irritating in her right hip and she has tenderness over the greater trochanter as she did previously. Her main complaint is pain with walking up stairs now.  Shed like to proceed with steroid injection the greater trochanteric bursa for relief of pain today.  He clears she does not have diabetes, and no evidence of A1c greater than 6.5 were fasting blood sugars 126 in the chart. So health care maintenance pertaining to diabetes has not been fulfilled. Do plan to check her once a year with fasting glucose as she is prediabetic.   HSV States that she has several red lesions in her groin area at this time. This is typical of her usual herpes outbreaks. She states that she would like a refill on her acyclovir. She's had herpes since 1983   ROS-  R hip pain,  No fever, chills, sweats No dyspnea no chest pain   Past Medical History Patient Active Problem List   Diagnosis Date Noted  . 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 Right hip pain 10/10/2013  . Piriformis syndrome of right side 06/10/2013  . Dysuria 11/25/2012  . Syncopal episodes 04/06/2012  . Headache 12/10/2011  . Wrist pain, chronic 11/17/2011  . Thumb  pain 08/06/2011  . Trochanteric bursitis 06/22/2011  . 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 Fatigue 03/26/2011  . Weight loss 03/26/2011  . Pelvic pain in female 12/17/2010  . Abdominal pain 12/17/2010  . Urinary incontinence 11/12/2010  . GENITAL HERPES 08/05/2010  . INSOMNIA 01/26/2009  . Pre-diabetes 12/15/2008  . DEPRESSION, MAJOR 05/17/2008  . ESSENTIAL HYPERTENSION, BENIGN 05/17/2008  . HYPERCHOLESTEROLEMIA 11/05/2006  . BIPOLAR DISORDER 11/05/2006    Medications- reviewed and updated Current Outpatient Prescriptions  Medication Sig Dispense Refill  . acyclovir (ZOVIRAX) 800 MG tablet Take 800 mg by mouth 4 (four) times daily. Increased by eye doctor      . ALPRAZolam (XANAX) 0.5 MG tablet Take 0.5 mg by mouth at bedtime as needed. For insomnia/anxiety       . ARIPiprazole (ABILIFY) 10 MG tablet Take 10 mg by mouth at bedtime. Rx'ed by [facility]      . hydrochlorothiazide (HYDRODIURIL) 25 MG tablet Take 1 tablet (25 mg total) by mouth daily.  30 tablet  11  . HYDROcodone-acetaminophen (NORCO) 10-325 MG per tablet Take 0.5-1 tablets by mouth every 6 (six) hours as needed (severe R sided hip pain).  15 tablet  0  . hydrOXYzine (ATARAX/VISTARIL) 25 MG tablet Take 1 tablet (25 mg total) by mouth at bedtime as needed for itching.  30 tablet  0  .  ibuprofen (ADVIL,MOTRIN) 800 MG tablet Take 1 tablet (800 mg total) by mouth every 8 (eight) hours as needed.  30 tablet  0  . lisinopril (PRINIVIL,ZESTRIL) 10 MG tablet Take 1 tablet (10 mg total) by mouth daily.  30 tablet  11  . meloxicam (MOBIC) 15 MG tablet Take 1 tablet (15 mg total) by mouth daily.  21 tablet  0  . mupirocin ointment (BACTROBAN) 2 % Apply topically 3 (three) times daily.  22 g  0  . traZODone (DESYREL) 50 MG tablet Take 50 mg by mouth at bedtime. Per [facility]       . TRIAMCINOLONE  ACETONIDE, TOP, 0.05 % OINT Apply 1 application topically 2 (two) times daily.  430 g  1  . venlafaxine (EFFEXOR-XR) 150 MG 24 hr capsule Take 300 mg by mouth daily. Per [facility] [address]        No current facility-administered medications for this visit.    Objective: BP 121/76  Pulse 90  Temp(Src) 98.5 F (36.9 C) (Oral)  Wt 157 lb (71.215 kg) Gen: NAD, alert, cooperative with exam HEENT: NCAT CV: RRR, good S1/S2, no murmur Resp: CTABL, no wheezes, non-labored Ext: No edema, warm  Neuro: Alert and oriented, No gross deficits MSK: No pain with ab/adduction of hip, Pain with palpation over greater trochanter   Informed consent obtained and placed in chart.  Time out performed.  Area cleaned with iodine x 1 and wiped clear with alcohol swab.  Using 21 1/2 gauge needle 2 cc Kenalog and 4 cc's 1% Lidocaine were injected in greater trochanter bursa.  Sterile bandage placed.  Patient tolerated procedure well.  No complications.     Assessment/Plan:  Right hip pain Greater trochanter bursitis, current flare Injected with steroids as described in the note Encouraged stretches and exercises previously provided for IT band syndrome Follow up as needed  HSV-2 (herpes simplex virus 2) infection Current flare per patient Explained 3 times a day dosing x2 days for treating flare And then 2 pills daily for suppression She is agreeable, given new Rx Followup as needed  ESSENTIAL HYPERTENSION, BENIGN Well-controlled today, continue lisinopril and HCTZ

## 2014-03-30 NOTE — Addendum Note (Signed)
Addended by: Elenora GammaBRADSHAW, SAMUEL L on: 03/30/2014 12:33 PM   Modules accepted: Level of Service

## 2014-03-30 NOTE — Assessment & Plan Note (Signed)
Well-controlled today, continue lisinopril and HCTZ

## 2014-05-12 ENCOUNTER — Ambulatory Visit: Payer: Medicare Other | Admitting: Family Medicine

## 2014-05-31 ENCOUNTER — Encounter: Payer: Self-pay | Admitting: Family Medicine

## 2014-05-31 ENCOUNTER — Ambulatory Visit (INDEPENDENT_AMBULATORY_CARE_PROVIDER_SITE_OTHER): Payer: Medicare Other | Admitting: Family Medicine

## 2014-05-31 VITALS — BP 143/80 | HR 80 | Temp 98.3°F | Wt 159.1 lb

## 2014-05-31 DIAGNOSIS — Z0000 Encounter for general adult medical examination without abnormal findings: Secondary | ICD-10-CM

## 2014-05-31 DIAGNOSIS — R2230 Localized swelling, mass and lump, unspecified upper limb: Secondary | ICD-10-CM | POA: Insufficient documentation

## 2014-05-31 DIAGNOSIS — N63 Unspecified lump in unspecified breast: Secondary | ICD-10-CM

## 2014-05-31 DIAGNOSIS — M76899 Other specified enthesopathies of unspecified lower limb, excluding foot: Secondary | ICD-10-CM

## 2014-05-31 DIAGNOSIS — M7062 Trochanteric bursitis, left hip: Secondary | ICD-10-CM | POA: Insufficient documentation

## 2014-05-31 NOTE — Progress Notes (Signed)
Patient ID: Janice Ramos, female   DOB: 23-Sep-1940, 73 y.o.   MRN: [id number]   HPI  Patient presents today for left hip pain right axillary mass  Right axillary mass Patient states that it's been there for about 3 weeks. Nontender to touch No nipple discharge or skin changes on the breast States that her mother had breast cancer, she's late for her mammogram this year.  Left hip pain Present over the last one month. Feels it is gradually worsening. States that it's worsened by walking especially by stepping up steps. Some improvement with 100 mg of ibuprofen. No fevers, chills, sweats  Smoking status noted ROS: Per HPI  Objective: BP 143/80  Pulse 80  Temp(Src) 98.3 F (36.8 C) (Oral)  Wt 159 lb 1.6 oz (72.167 kg) Gen: NAD, alert, cooperative with exam HEENT: NCAT, EOMI, PERRL CV: RRR, good S1/S2, no murmur Resp: CTABL, no wheezes, non-labored Abd: SNTND, BS present, no guarding or organomegaly Ext: Left hip with full range of motion, pain at the greater trochanter with palpation and with flexion of the hip. Neuro: Alert and oriented, No gross deficits  Left greater trochanter injection   Informed consent obtained and placed in chart. Time out performed. Area cleaned with iodine x 3 and wiped clear with alcohol swab. Using 23 1/2 gauge needle 2 cc Depo-Medrol and 4 cc's 1% Lidocaine were injected at the site of tenderness over the greater trochanter. Sterile bandage placed. Patient tolerated procedure well. No complications.    Assessment and plan:  Greater trochanteric bursitis of left hip Symptoms consistent with greater trochanteric bursitis, also some concern for IT and piriformis tightness Discuss stretching with patient, she's got a handout I gave her months ago in file at home She requests steroid injection today, discussed pros and cons of her and injected left greater trochanter bursa for the procedure description above. Followup in one month for chronic disease  management   Healthcare maintenance Patient refuses flu shot due to believing that L. tracks keeps her from getting flu shot, she still refuses despite me explaining that symmetry Prevnar today Mammogram per axillary mass  Axillary mass Per patient, present for several weeks No nipple dc or skin changes on her breast Mother had Breast cancer,  Diagnostic mammo ordered    Orders Placed This Encounter  Procedures  . MM Digital Diagnostic Bilat    Standing Status: Future     Number of Occurrences:      Standing Expiration Date: 08/01/2015    Order Specific Question:  Reason for Exam (SYMPTOM  OR DIAGNOSIS REQUIRED)    Answer:  R breast/axillary firm mass    Order Specific Question:  Preferred imaging location?    Answer:  [facility]    No orders of the defined types were placed in this encounter.

## 2014-05-31 NOTE — Assessment & Plan Note (Addendum)
Patient refuses flu shot due to believing that L. tracks keeps her from getting flu shot, she still refuses despite me explaining that symmetry Prevnar today Mammogram per axillary mass

## 2014-05-31 NOTE — Assessment & Plan Note (Signed)
Symptoms consistent with greater trochanteric bursitis, also some concern for IT and piriformis tightness Discuss stretching with patient, she's got a handout I gave her months ago in file at home She requests steroid injection today, discussed pros and cons of her and injected left greater trochanter bursa for the procedure description above. Followup in one month for chronic disease management

## 2014-05-31 NOTE — Assessment & Plan Note (Addendum)
Per patient, present for several weeks No nipple dc or skin changes on her breast Mother had Breast cancer,  Diagnostic mammo ordered

## 2014-05-31 NOTE — Patient Instructions (Signed)
Great to see you!  Please come back in 1 month for discussion about your HTN and other chronic medical problems.   Lets make sure you get you mammo set up and the mass evaluated.

## 2014-06-02 ENCOUNTER — Other Ambulatory Visit: Payer: Self-pay | Admitting: Family Medicine

## 2014-06-02 DIAGNOSIS — N63 Unspecified lump in unspecified breast: Secondary | ICD-10-CM

## 2014-06-21 ENCOUNTER — Ambulatory Visit (INDEPENDENT_AMBULATORY_CARE_PROVIDER_SITE_OTHER): Payer: Medicare Other | Admitting: Family Medicine

## 2014-06-21 ENCOUNTER — Encounter: Payer: Self-pay | Admitting: Family Medicine

## 2014-06-21 VITALS — BP 142/80 | HR 93 | Temp 98.8°F | Ht 62.0 in | Wt 155.5 lb

## 2014-06-21 DIAGNOSIS — M25551 Pain in right hip: Secondary | ICD-10-CM | POA: Diagnosis not present

## 2014-06-21 DIAGNOSIS — M7061 Trochanteric bursitis, right hip: Secondary | ICD-10-CM | POA: Diagnosis present

## 2014-06-21 MED ORDER — HYDROCODONE-ACETAMINOPHEN 10-325 MG PO TABS
0.5000 | ORAL_TABLET | Freq: Four times a day (QID) | ORAL | Status: DC | PRN
Start: 2014-06-21 — End: 2016-01-10

## 2014-06-21 MED ORDER — METHYLPREDNISOLONE ACETATE 40 MG/ML IJ SUSP
40.0000 mg | Freq: Once | INTRAMUSCULAR | Status: AC
  Administered 2014-06-21: 40 mg via INTRA_ARTICULAR

## 2014-06-21 NOTE — Patient Instructions (Signed)
Thank you for coming in, today!  The injection today should help your hip and knee pain. It might take a few days for it to kick in, and the pain might be worse for a day or so. I will give you some Norco (hydrocodone-acetaminophen) for pain in the meantime. Keep doing your exercises as best you can.  Come back to see Dr. Ermalinda MemosBradshaw in a couple of weeks, or sooner if you need. He may want to send you to sports medicine or the orthopedics doctors.  Please feel free to call with any questions or concerns at any time, at [phone]. --Dr. Casper HarrisonStreet

## 2014-06-21 NOTE — Assessment & Plan Note (Signed)
A: Recurrent problem with exam findings strongly suggestive of right trochanteric bursitis, s/p injection about 8 months ago and similar complaints / injection on the other side about 2 weeks ago. Referred pain to knee likely from hip pain and/or overstressing the knee joint by favoring the right hip. Tylenol ineffective, and pt requests repeat injection.  P: Injection of right trochanteric bursa as noted above in progress note from today. No immediate complications. Rx for Norco for severe pain not helped by Tylenol alone; counseled against use of Tylenol with other acetaminophen-containing products. Also advised continued use of exercises as before. F/u with PCP in about 2 weeks, or as needed. If no improvement, strongly consider referral to sports medicine or orthopedics for further evaluation / treatment.

## 2014-06-21 NOTE — Progress Notes (Signed)
   Subjective:    Patient ID: Janice Ramos, female    DOB: [birth year], 73 y.o.   MRN: [id number]  HPI: [facility] right hip / knee pain. This has been a recurrent problem; she was given a right hip injection back in February and a left hip injection about two weeks ago. She has had steady burning, aching pain, worse at night, and worse with walking and standing. The pain radiates to her knee. She had her knee / leg on the right "give way underneath her" about a week. She has been taking Tylenol without much help. She has been taking ibuprofen 800 mg intermittently, which helps for a couple of hours. She has done some of the stretching exercises Dr. Ermalinda MemosBradshaw gave her previously, but her pain limits her ability to do them, and they "haven't been helping much."  Review of Systems: As above. Pt denies fevers, chills, weight loss, N/V, change in her bowel / bladder habits, or numbness / tingling in her feet.     Objective:   Physical Exam BP 142/80  Pulse 93  Temp(Src) 98.8 F (37.1 C) (Oral)  Ht 5\' 2"  (1.575 m)  Wt 155 lb 8 oz (70.534 kg)  BMI 28.43 kg/m2 Gen: adult elderly female in NAD, but clearly uncomfortable MSK/Neuro: antalgic limping gait, strength 4+/5 on right in LE secondary to pain, otherwise 5/5  Increased pain with changes in position, sitting, standing, and worst with lying flat  Pain / tenderness centered over greater trochanter of right femur  Also some tenderness diffusely in right knee, but no frank effusion / dislocation and ROM full  Active ROM reduced secondary to pain and passive ROM slowed due to pain  Increased pain in hip with internal rotation greater than external rotation  Greatly increased pain with log-roll and FABER test on right  Left side normal in comparison, throughout exam  Alert / oriented, sensation grossly intact otherwise Cardio: RRR, no murmur appreciated Pulm: CTAB, normal WOB Abd: soft, nontender, BS+; no groin  pain     Assessment & Plan:  Trochanteric bursitis, pt requests repeat injection. Risks / benefits reviewed. See problem list note.  Procedure Note: Left Trochanteric Bursa Injection Informed consent obtained and placed in chart. Appropriate time out performed. Landmarks of hip identified and area of greatest tenderness identified. Area cleaned with iodine and alcohol swabs. A 21-gauge 1-1/2 inch needle was used to inject a mixture of 1 mL of Depo-Medrol and 4 mL of 1% Lidocaine without epi. Injection was performed at the site of greatest tenderness over the greater trochanter.  Needle was partially removed and redirected several times to infiltrate the entire bursa in 1-1.5 mL increments. Bandaid placed over injection site.  Patient tolerated procedure well with no immediate complications.

## 2014-06-22 ENCOUNTER — Other Ambulatory Visit: Payer: Self-pay | Admitting: Family Medicine

## 2014-06-22 NOTE — Telephone Encounter (Signed)
Covering for Dr. Ermalinda MemosBradshaw. Refilled lisinopril and HCTZ. --CMS

## 2014-06-28 ENCOUNTER — Telehealth: Payer: Self-pay | Admitting: Family Medicine

## 2014-06-28 NOTE — Telephone Encounter (Signed)
Called pt back; see office visit from 10/14. Pt reports her low back and hip pain are worse; she thinks the right hip injection may have helped "some," last week, and Norco does help, but overall her pain is worse, daily, and interfering with her ability to walk and perform her daily activities. Advised pt to return to clinic tomorrow; her PCP Dr. Ermalinda MemosBradshaw is [facility]. Pt states she will call back to schedule an appointment for tomorrow with Dr. Ermalinda MemosBradshaw. --CMS

## 2014-06-28 NOTE — Telephone Encounter (Signed)
Pt seen by Street on 10/14 for hip pain. Pt was told that if pain did not resolve or improve to call the office. Pt states the pain has worsened, and would like to speak to Dr. Casper HarrisonStreet, if possible. Pls advise.

## 2014-06-29 ENCOUNTER — Encounter: Payer: Self-pay | Admitting: Family Medicine

## 2014-06-29 ENCOUNTER — Ambulatory Visit (INDEPENDENT_AMBULATORY_CARE_PROVIDER_SITE_OTHER): Payer: Medicare Other | Admitting: Family Medicine

## 2014-06-29 VITALS — BP 135/79 | HR 85 | Temp 98.2°F | Ht 62.0 in | Wt 155.6 lb

## 2014-06-29 DIAGNOSIS — M545 Low back pain, unspecified: Secondary | ICD-10-CM

## 2014-06-29 DIAGNOSIS — M7062 Trochanteric bursitis, left hip: Secondary | ICD-10-CM

## 2014-06-29 MED ORDER — TRAMADOL HCL 50 MG PO TABS: 50.0000 mg | ORAL_TABLET | Freq: Three times a day (TID) | ORAL | Status: DC | PRN

## 2014-06-29 NOTE — Assessment & Plan Note (Signed)
Lumbar pain with midline bony tenderness is noted bothering her for several months Plain films of lumbar spine Referred orthopedic surgery as her old MRI showed L4-L5 facet disease that may be helped by epidural injections No red flags Discussed red flags in detail, followup with me as needed Rx given for tramadol

## 2014-06-29 NOTE — Patient Instructions (Signed)
Great to see you today, I am sorry you are in pain.   We will get you set up with ortho  Get your xray, you can walk in at [facility]  Try the tramadol, 1-2 pills twice daily as needed for pain .   Seek immediate medical attention if you are developing worsening weakness in your legs, numbness or tingling in your underwear area, or loss of bowel control

## 2014-06-29 NOTE — Progress Notes (Signed)
Patient ID: Janice Ramos, female   DOB: November 03, 1940, 73 y.o.   MRN: [id number]   HPI  Patient presents today for continued low back and right hip pain.  Patient states that several months ago she initially had right hip pain which I injected and showed good improvement from. A few weeks ago she had left hip pain and had an injection . with mild improvement she was seen  and her right greater trochanteric bursa was injected again this time she had no relief of symptoms.  She states that all these symptoms began several months ago, June to the best of her recollection. She now describes that her right hip pain and low back pain has worsened since her last injection. She states she has low midline back pain which radiates downward of the-like shape and radiates down her right lateral thigh. She does note difficulty walking and perceived weakness of her lower extremities. She denies any bowel or new bladder dysfunction or saddle anesthesia.   She states that her legs have recently given out but she denies any recent falls.   Smoking status noted ROS: Per HPI  Objective: BP 135/79  Pulse 85  Temp(Src) 98.2 F (36.8 C) (Oral)  Ht 5\' 2"  (1.575 m)  Wt 155 lb 9.6 oz (70.58 kg)  BMI 28.45 kg/m2 Gen: NAD, alert, cooperative with exam HEENT: NCAT Ext: No edema, warm Neuro: Alert and oriented, No gross deficits, very careful gait, strength 4/5 in bilateral lower extremities, reflexes 2+ bilateral patella tendons, great toe flexor strength 5/5 in bilateral feet, hip flexor strength limited due to pain MSK: Midline tenderness of her lumbar spine, no paraspinal muscle tenderness, range of motion and strength of hip flexors limited due to pain, 4/5 bilateral quad strength   Assessment and plan:  Lumbar pain Lumbar pain with midline bony tenderness is noted bothering her for several months Plain films of lumbar spine Referred orthopedic surgery as her old MRI showed L4-L5 facet disease that may be  helped by epidural injections No red flags Discussed red flags in detail, followup with me as needed Rx given for tramadol  Greater trochanteric bursitis of left hip Still with some symptoms, also some symptoms concerning for IT band syndrome Has been trying exercises but limited due to pain Followup with orthopedic surgery   Orders Placed This Encounter  Procedures  . DG Lumbar Spine Complete    Standing Status: Future     Number of Occurrences:      Standing Expiration Date: 08/30/2015    Order Specific Question:  Reason for Exam (SYMPTOM  OR DIAGNOSIS REQUIRED)    Answer:  midline bony tenderness    Order Specific Question:  Preferred imaging location?    Answer:  [facility]  . Ambulatory referral to Orthopedic Surgery    Referral Priority:  Routine    Referral Type:  Surgical    Referral Reason:  Specialty Services Required    Requested Specialty:  Orthopedic Surgery    Number of Visits Requested:  1    Meds ordered this encounter  Medications  . traMADol (ULTRAM) 50 MG tablet    Sig: Take 1 tablet (50 mg total) by mouth every 8 (eight) hours as needed.    Dispense:  45 tablet    Refill:  0

## 2014-06-29 NOTE — Assessment & Plan Note (Signed)
Still with some symptoms, also some symptoms concerning for IT band syndrome Has been trying exercises but limited due to pain Followup with orthopedic surgery

## 2014-06-30 ENCOUNTER — Encounter: Payer: Self-pay | Admitting: Family Medicine

## 2014-06-30 ENCOUNTER — Ambulatory Visit
Admission: RE | Admit: 2014-06-30 | Discharge: 2014-06-30 | Disposition: A | Discharge status: Home / Self Care | Payer: Medicare Other | Source: Ambulatory Visit | Attending: Family Medicine | Admitting: Family Medicine

## 2014-06-30 ENCOUNTER — Telehealth: Payer: Self-pay | Admitting: Family Medicine

## 2014-06-30 DIAGNOSIS — M545 Low back pain, unspecified: Secondary | ICD-10-CM

## 2014-06-30 NOTE — Telephone Encounter (Signed)
Called to discuss results of recent film.   No acute findings. No answer so will send letter.   Murtis SinkSam Brandy Zuba, MD [facility] Resident, PGY-3 06/30/2014, 1:52 PM

## 2014-06-30 NOTE — Telephone Encounter (Signed)
Pt called and would like Dr. Ermalinda MemosBradshaw to call her because she would like the epidural in her back. jw

## 2014-07-03 NOTE — Telephone Encounter (Signed)
   Discussed that ortho will offer treatment options and set up epidural injections.   [facility]   Murtis SinkSam Leinaala Catanese, MD [facility] Resident, PGY-3 07/03/2014, 10:18 AM

## 2014-07-19 ENCOUNTER — Ambulatory Visit: Payer: Medicare Other | Admitting: Family Medicine

## 2014-09-09 ENCOUNTER — Other Ambulatory Visit: Payer: Self-pay | Admitting: Family Medicine

## 2014-10-06 ENCOUNTER — Ambulatory Visit: Payer: Medicare Other | Admitting: Family Medicine

## 2014-10-16 ENCOUNTER — Ambulatory Visit (INDEPENDENT_AMBULATORY_CARE_PROVIDER_SITE_OTHER): Payer: Medicare Other | Admitting: Family Medicine

## 2014-10-16 ENCOUNTER — Encounter: Payer: Self-pay | Admitting: Family Medicine

## 2014-10-16 VITALS — BP 146/81 | HR 91 | Temp 97.9°F | Ht 62.0 in | Wt 160.0 lb

## 2014-10-16 DIAGNOSIS — K130 Diseases of lips: Secondary | ICD-10-CM | POA: Insufficient documentation

## 2014-10-16 DIAGNOSIS — L301 Dyshidrosis [pompholyx]: Secondary | ICD-10-CM

## 2014-10-16 DIAGNOSIS — R51 Headache: Secondary | ICD-10-CM

## 2014-10-16 DIAGNOSIS — M545 Low back pain, unspecified: Secondary | ICD-10-CM

## 2014-10-16 DIAGNOSIS — R21 Rash and other nonspecific skin eruption: Secondary | ICD-10-CM

## 2014-10-16 DIAGNOSIS — I10 Essential (primary) hypertension: Secondary | ICD-10-CM

## 2014-10-16 DIAGNOSIS — R519 Headache, unspecified: Secondary | ICD-10-CM

## 2014-10-16 DIAGNOSIS — L659 Nonscarring hair loss, unspecified: Secondary | ICD-10-CM

## 2014-10-16 LAB — CBC WITH DIFFERENTIAL/PLATELET
BASOS ABS: 0 10*3/uL (ref 0.0–0.1)
Basophils Relative: 0 % (ref 0–1)
EOS PCT: 1 % (ref 0–5)
Eosinophils Absolute: 0.1 10*3/uL (ref 0.0–0.7)
HCT: 41.1 % (ref 36.0–46.0)
Hemoglobin: 14.2 g/dL (ref 12.0–15.0)
LYMPHS PCT: 27 % (ref 12–46)
Lymphs Abs: 2.3 10*3/uL (ref 0.7–4.0)
MCH: 32.3 pg (ref 26.0–34.0)
MCHC: 34.5 g/dL (ref 30.0–36.0)
MCV: 93.6 fL (ref 78.0–100.0)
MONO ABS: 0.7 10*3/uL (ref 0.1–1.0)
MONOS PCT: 8 % (ref 3–12)
MPV: 8.9 fL (ref 8.6–12.4)
NEUTROS ABS: 5.4 10*3/uL (ref 1.7–7.7)
NEUTROS PCT: 64 % (ref 43–77)
PLATELETS: 384 10*3/uL (ref 150–400)
RBC: 4.39 MIL/uL (ref 3.87–5.11)
RDW: 12.9 % (ref 11.5–15.5)
WBC: 8.4 10*3/uL (ref 4.0–10.5)

## 2014-10-16 LAB — COMPREHENSIVE METABOLIC PANEL
ALBUMIN: 4.5 g/dL (ref 3.5–5.2)
ALK PHOS: 60 U/L (ref 39–117)
ALT: 19 U/L (ref 0–35)
AST: 24 U/L (ref 0–37)
BILIRUBIN TOTAL: 0.4 mg/dL (ref 0.2–1.2)
BUN: 22 mg/dL (ref 6–23)
CO2: 29 meq/L (ref 19–32)
Calcium: 9.4 mg/dL (ref 8.4–10.5)
Chloride: 93 mEq/L — ABNORMAL LOW (ref 96–112)
Creat: 0.81 mg/dL (ref 0.50–1.10)
GLUCOSE: 97 mg/dL (ref 70–99)
POTASSIUM: 3.8 meq/L (ref 3.5–5.3)
Sodium: 130 mEq/L — ABNORMAL LOW (ref 135–145)
Total Protein: 7.2 g/dL (ref 6.0–8.3)

## 2014-10-16 LAB — TSH: TSH: 2.85 u[IU]/mL (ref 0.350–4.500)

## 2014-10-16 MED ORDER — ACYCLOVIR 800 MG PO TABS: ORAL_TABLET | ORAL | Status: DC

## 2014-10-16 MED ORDER — TRAMADOL HCL 50 MG PO TABS: 50.0000 mg | ORAL_TABLET | Freq: Three times a day (TID) | ORAL | Status: DC | PRN

## 2014-10-16 MED ORDER — CLOBETASOL PROPIONATE 0.05 % EX CREA: 1.0000 "application " | TOPICAL_CREAM | Freq: Two times a day (BID) | CUTANEOUS | Status: DC

## 2014-10-16 MED ORDER — TRIAMCINOLONE ACETONIDE 0.05 % EX OINT: 1.0000 "application " | TOPICAL_OINTMENT | Freq: Two times a day (BID) | CUTANEOUS | Status: DC

## 2014-10-16 NOTE — Assessment & Plan Note (Signed)
Lip lesion for 2+ months Has had a course of acyclovir making HSV less likely Refer to derm for possible Bx given peristence.

## 2014-10-16 NOTE — Assessment & Plan Note (Signed)
Reasonable today, Continue lisinopril, hydrochlorothiazide Labs today

## 2014-10-16 NOTE — Progress Notes (Signed)
Patient ID: Janice Ramos, female   DOB: [birth year], 74 y.o.   MRN: [id number]   HPI  Patient presents today for follow-up hypertension and acute complaints  Rash Has been bothering her the last several weeks, states that it's very itchy on her hands and back, no fever chills or sweats. We discussed compulsive handwashing previously and she states that she's tried to cut this back and use lots of lotion which improved her symptoms slightly.  Mouth lesion-right sided mouth lesion that's crustier bleeds whenever she opens her mouth to eat. It has lasted 2+ months  Headache Described as persistent frontal headache over the last 18 hours, no vision changes or weakness. Has 3-4 per month it which are helped by 800 mg of Motrin Tramadol helps as well. Positive photophobia No nausea  Hypertension Taking meds No chest pain, dyspnea, leg edema.  Lumbar back pain Has resolved after seeing orthopedics, states that it resolved spontaneously before she could see a neurosurgeon.  Herpes Feels that she's having a current flare, has been taking maintenance low-dose acyclovir also has had a course of acyclovir at treatment dose since the onset of her mouth lesion    Smoking status noted ROS: Per HPI  Objective: BP 146/81 mmHg  Pulse 91  Temp(Src) 97.9 F (36.6 C) (Oral)  Ht 5\' 2"  (1.575 m)  Wt 160 lb (72.576 kg)  BMI 29.26 kg/m2 Gen: NAD, alert, cooperative with exam HEENT: NCAT CV: RRR, good S1/S2, no murmur Resp: CTABL, no wheezes, non-labored Ext: No edema, warm Neuro: Alert and oriented, No gross deficits  Skin erythematous papules on right hand in the webspace of the thumb, also erythematous papules, 3-4 on right and left palmar surface, 3-4 Erythematous papules on upper back, no excoriations  Assessment and plan:  Dyshidrotic hand dermatitis With compulsive handwashing Discussed supportive care Clobetasol for thick skin of the hand    Lip lesion Lip lesion for 2+  months Has had a course of acyclovir making HSV less likely Refer to derm for possible Bx given peristence.    ESSENTIAL HYPERTENSION, BENIGN Reasonable today, Continue lisinopril, hydrochlorothiazide Labs today   Lumbar pain Resolved, as seen orthopedics     Hair loss Rectal hair thinning, no scaling Check TSH Nutritionally seems adequate Discussed Rogaine, referral to dermatology for lip lesion, however their input would be appreciated   Headache Migrainous by description Discussed NSAIDs and tramadol, given limited for NSAIDs however these work well for her so I think it's okay Discussed prophylactic treatment and offered if they become worse frequent.     Orders Placed This Encounter  Procedures  . TSH  . CBC with Differential  . Comprehensive metabolic panel  . Ambulatory referral to Dermatology    Referral Priority:  Routine    Referral Type:  Consultation    Referral Reason:  Specialty Services Required    Requested Specialty:  Dermatology    Number of Visits Requested:  1    Meds ordered this encounter  Medications  . TRIAMCINOLONE ACETONIDE, TOP, 0.05 % OINT    Sig: Apply 1 application topically 2 (two) times daily. For your body    Dispense:  430 g    Refill:  1  . clobetasol cream (TEMOVATE) 0.05 %    Sig: Apply 1 application topically 2 (two) times daily. For your hands only    Dispense:  30 g    Refill:  0

## 2014-10-16 NOTE — Patient Instructions (Addendum)
Great to see you!  Your headache sounds like a migraine, if you get more than 3 per month we could talk about an everyday medicine for them Try 800 mg ibuprofen, no more than 4 times per month  Rash -  Hands - use clobetasol, reduce hand washing Body - triamcinolone, watch for worsening, if this happens please come back sooner.

## 2014-10-16 NOTE — Assessment & Plan Note (Signed)
Migrainous by description Discussed NSAIDs and tramadol, given limited for NSAIDs however these work well for her so I think it's okay Discussed prophylactic treatment and offered if they become worse frequent.

## 2014-10-16 NOTE — Assessment & Plan Note (Signed)
Resolved, as seen orthopedics

## 2014-10-16 NOTE — Assessment & Plan Note (Signed)
Rectal hair thinning, no scaling Check TSH Nutritionally seems adequate Discussed Rogaine, referral to dermatology for lip lesion, however their input would be appreciated

## 2014-10-16 NOTE — Assessment & Plan Note (Signed)
With compulsive handwashing Discussed supportive care Clobetasol for thick skin of the hand

## 2014-10-18 ENCOUNTER — Encounter: Payer: Self-pay | Admitting: Family Medicine

## 2014-10-18 ENCOUNTER — Telehealth: Payer: Self-pay | Admitting: Family Medicine

## 2014-10-18 NOTE — Telephone Encounter (Signed)
Called to discuss results, no answer, left VM. Will send letter.   Hyponatremia is mild could be due to psychogenic meds or HCTZ. Seem sto be chronic so will plan to repeat in 2-3 months.   Murtis SinkSam Bradshaw, MD [facility] Resident, PGY-3 10/18/2014, 12:28 PM

## 2014-11-07 ENCOUNTER — Other Ambulatory Visit: Payer: Self-pay | Admitting: *Deleted

## 2014-11-07 MED ORDER — HYDROCHLOROTHIAZIDE 25 MG PO TABS: 25.0000 mg | ORAL_TABLET | Freq: Every day | ORAL | Status: DC

## 2014-11-07 MED ORDER — LISINOPRIL 10 MG PO TABS: 10.0000 mg | ORAL_TABLET | Freq: Every day | ORAL | Status: DC

## 2014-12-15 ENCOUNTER — Ambulatory Visit: Payer: Medicare Other | Admitting: Family Medicine

## 2015-01-19 ENCOUNTER — Ambulatory Visit: Payer: Medicare Other | Admitting: Family Medicine

## 2015-01-30 ENCOUNTER — Encounter: Payer: Self-pay | Admitting: Family Medicine

## 2015-01-30 ENCOUNTER — Ambulatory Visit (INDEPENDENT_AMBULATORY_CARE_PROVIDER_SITE_OTHER): Payer: Medicare Other | Admitting: Family Medicine

## 2015-01-30 VITALS — BP 139/72 | HR 87 | Temp 98.8°F | Ht 62.0 in | Wt 154.0 lb

## 2015-01-30 DIAGNOSIS — L659 Nonscarring hair loss, unspecified: Secondary | ICD-10-CM | POA: Diagnosis not present

## 2015-01-30 DIAGNOSIS — R630 Anorexia: Secondary | ICD-10-CM

## 2015-01-30 DIAGNOSIS — L301 Dyshidrosis [pompholyx]: Secondary | ICD-10-CM | POA: Diagnosis not present

## 2015-01-30 DIAGNOSIS — R21 Rash and other nonspecific skin eruption: Secondary | ICD-10-CM | POA: Diagnosis not present

## 2015-01-30 DIAGNOSIS — E871 Hypo-osmolality and hyponatremia: Secondary | ICD-10-CM | POA: Diagnosis not present

## 2015-01-30 DIAGNOSIS — Z0000 Encounter for general adult medical examination without abnormal findings: Secondary | ICD-10-CM | POA: Diagnosis not present

## 2015-01-30 DIAGNOSIS — L218 Other seborrheic dermatitis: Secondary | ICD-10-CM | POA: Diagnosis present

## 2015-01-30 DIAGNOSIS — L219 Seborrheic dermatitis, unspecified: Secondary | ICD-10-CM

## 2015-01-30 HISTORY — DX: Anorexia: R63.0

## 2015-01-30 LAB — BASIC METABOLIC PANEL WITH GFR
BUN: 16 mg/dL (ref 6–23)
CALCIUM: 9.4 mg/dL (ref 8.4–10.5)
CHLORIDE: 97 meq/L (ref 96–112)
CO2: 28 mEq/L (ref 19–32)
Creat: 0.7 mg/dL (ref 0.50–1.10)
GFR, Est African American: >89 mL/min
GFR, Est Non African American: 86 mL/min
GLUCOSE: 81 mg/dL (ref 70–99)
Potassium: 4 mEq/L (ref 3.5–5.3)
Sodium: 133 mEq/L — ABNORMAL LOW (ref 135–145)

## 2015-01-30 MED ORDER — TRIAMCINOLONE ACETONIDE 0.05 % EX OINT: 1.0000 "application " | TOPICAL_OINTMENT | Freq: Two times a day (BID) | CUTANEOUS | Status: DC

## 2015-01-30 NOTE — Assessment & Plan Note (Signed)
Doing well with clobetasol ointment, continue

## 2015-01-30 NOTE — Assessment & Plan Note (Signed)
Scaly itchy scalp, Physical exam with mild generalized erythema and scaling, some excoriations Discussed Rx meds, she likes conservative therpaies first so will give trial of Tgel F/u as needed, Consider steroid/antifungal shampoo if no improvement

## 2015-01-30 NOTE — Assessment & Plan Note (Signed)
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

## 2015-01-30 NOTE — Patient Instructions (Signed)
Great to see you!  Try TGel for your scalp, let it sit for at least 1 minute when using  Use the triamcinolone for your chest for 3-5 day bursts when the itching is intense  Consider the pneumionia shot, prevnar  Come back for a routine check up in 6 months

## 2015-01-30 NOTE — Assessment & Plan Note (Signed)
Stool card X 3, 74 but 10+ years life expectancy so would consider Cscope if positive

## 2015-01-30 NOTE — Assessment & Plan Note (Signed)
Improving with rogaine, continue

## 2015-01-30 NOTE — Progress Notes (Signed)
Patient ID: Janice Ramos, female   DOB: [birth year], 74 y.o.   MRN: [id number]   HPI  Patient presents today for follow-up rash  Rash Patient states her last 8-9 months she's had itchy scalp rash, sometimes with boils that she scratches in the drain. She states she covers them with Neosporin of those times.  She also notes that diffuse itchy rash across her chest on the sun exposed areas. She states that she uses sunscreen regularly She has not tried any other medications besides lotion on that area. There is no discrete areas of fluctuance or drainage. She denies headache, fever, chills, chest pain, and dyspnea.  Loss of appetite She does have loss of appetite she reports her last 2 years. She states she's lost 11 pounds recently but then just gained back 6 pounds this week due to a binge. She states that at times she so poorly that she gets lightheaded and nearly falls. After long discussion she would very much like to be referred to nutritionist.  PMH: Smoking status noted - never ROS: Per HPI  Objective: BP 139/72 mmHg  Pulse 87  Temp(Src) 98.8 F (37.1 C) (Oral)  Ht 5\' 2"  (1.575 m)  Wt 154 lb (69.854 kg)  BMI 28.16 kg/m2 Gen: NAD, alert, cooperative with exam HEENT: NCAT CV: RRR, good S1/S2, no murmur Resp: CTABL, no wheezes, non-labored Ext: No edema, warm Neuro: Alert and oriented, No gross deficits  Skin: Head - diffuse slight/mild erythema with some fine scaling, one area of excoriation on occipital area Skin on chest with mild diffuse erythema, a few areas of erythematous patches and fine scale.  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     Orders Placed This Encounter  Procedures  . BASIC METABOLIC PANEL WITH GFR  . POC Hemoccult Bld/Stl (3-Cd Home Screen)    Standing Status: Future     Number of Occurrences:      Standing Expiration Date: 01/30/2016    Meds ordered this encounter  Medications  . TRIAMCINOLONE ACETONIDE, TOP, 0.05 % OINT    Sig: Apply 1 application topically 2 (two) times daily. For your body    Dispense:  430 g    Refill:  1

## 2015-01-30 NOTE — Assessment & Plan Note (Signed)
Reports loss of appetite and secondarily weight loss, weight loss obbjectively is not appreciated, weight relatively stable in the last 2 years Discussed with her if she think she would benefit from a nutritionist and she would very much like to be referred, given information our clinical nutritionist today.

## 2015-01-31 ENCOUNTER — Encounter: Payer: Self-pay | Admitting: Family Medicine

## 2015-07-31 ENCOUNTER — Ambulatory Visit: Payer: Medicare Other | Admitting: Family Medicine

## 2015-08-06 ENCOUNTER — Encounter: Payer: Self-pay | Admitting: Family Medicine

## 2015-08-06 ENCOUNTER — Ambulatory Visit (INDEPENDENT_AMBULATORY_CARE_PROVIDER_SITE_OTHER): Payer: Medicare Other | Admitting: Family Medicine

## 2015-08-06 VITALS — BP 131/69 | HR 81 | Temp 97.7°F | Ht 62.0 in | Wt 147.6 lb

## 2015-08-06 DIAGNOSIS — R21 Rash and other nonspecific skin eruption: Secondary | ICD-10-CM

## 2015-08-06 LAB — POCT SKIN KOH: Skin KOH, POC: NEGATIVE

## 2015-08-06 MED ORDER — CLOBETASOL PROPIONATE 0.05 % EX CREA: 1.0000 "application " | TOPICAL_CREAM | Freq: Two times a day (BID) | CUTANEOUS | Status: DC

## 2015-08-06 MED ORDER — CLOTRIMAZOLE 1 % EX OINT: TOPICAL_OINTMENT | CUTANEOUS | Status: DC

## 2015-08-06 NOTE — Assessment & Plan Note (Signed)
Rash most consistent with tinea pedis, however KOH prep negative. Patient has a history of dyshidrotic eczema which could manifest as the patient's current rash. No signs or symptoms of superimposed bacterial infection. Will treat with clotrimazole ointment. If no improvement after 1 week of treatment, will try clobetasol cream. If still no improvement, may need skin biopsy. Return precautions reviewed.

## 2015-08-06 NOTE — Progress Notes (Signed)
    Subjective:  Janice Ramos is a 74 y.o. female who [facility] with a chief complaint of foot rash.   HPI:  Foot Rash  Patient presents with worsening rash to her bilateral feet for the past month. Rash started out as red and painful and has progressed to skin peeling, flaking and cracking. Rash started out between her toes then progressed to involving most of her sole and the dorsal aspect of her foot. Rash is associated with a burning/tingling pain. She has tried using oatmeal soaks which has helped some, but the rash has continued to worsen. She has not tried any other treatments. She has noticed a small amount of bleeding in the areas where her skin is cracked. No purulent drainage. Patient thinks that she has a "severe" case of athlete's food. No weakness or numbness. No fevers or chills.   ROS: Per HPI  Objective:  Physical Exam: BP 131/69 mmHg  Pulse 81  Temp(Src) 97.7 F (36.5 C) (Oral)  Ht 5\' 2"  (1.575 m)  Wt 147 lb 9 oz (66.934 kg)  BMI 26.98 kg/m2  SpO2 100%  Gen: NAD, resting comfortably CV: RRR with no murmurs appreciated Lungs: NWOB, CTAB with no crackles, wheezes, or rhonchi GI: Normal bowel sounds present. Soft, Nontender, Nondistended. MSK: no edema, cyanosis, or clubbing noted Skin:  - Feet: Erythematous rash with areas of desquamation and skin breakdown where skin has cracked. No areas or purulence or signs of bacterial infection.  Neuro: grossly normal, moves all extremities Psych: Normal affect and thought content  Results for orders placed or performed in visit on 08/06/15 (from the past 72 hour(s))  POCT Skin KOH     Status: None   Collection Time: 08/06/15 11:00 AM  Result Value Ref Range   Skin KOH, POC Negative    Assessment/Plan:  Rash Rash most consistent with tinea pedis, however KOH prep negative. Patient has a history of dyshidrotic eczema which could manifest as the patient's current rash. No signs  or symptoms of superimposed bacterial infection. Will treat with clotrimazole ointment. If no improvement after 1 week of treatment, will try clobetasol cream. If still no improvement, may need skin biopsy. Return precautions reviewed.    Katina Degreealeb M. Jimmey RalphParker, MD [facility] Resident PGY-2 08/06/2015 11:51 AM

## 2015-08-06 NOTE — Patient Instructions (Signed)
Thank you for coming to the clinic today. It was nice seeing you.  For your rash, it may be athelete's foot. We will send in a prescription for an anti-fungal ointment today. Please apply twice a day. If this does not get better, we will try a steroid ointment.  If your symptoms are not improving or are worsening after trying both ointments, please let us know. IF you have any fevers, chills, severe pain, or spreading redness in your legs, please let us know.   Take care,  Dr Jimmey RalphParker

## 2015-08-16 ENCOUNTER — Encounter: Payer: Self-pay | Admitting: Family Medicine

## 2015-08-16 ENCOUNTER — Ambulatory Visit (INDEPENDENT_AMBULATORY_CARE_PROVIDER_SITE_OTHER): Payer: Medicare Other | Admitting: Family Medicine

## 2015-08-16 VITALS — BP 149/72 | HR 85 | Temp 98.3°F | Wt 145.7 lb

## 2015-08-16 DIAGNOSIS — L600 Ingrowing nail: Secondary | ICD-10-CM | POA: Diagnosis not present

## 2015-08-16 HISTORY — DX: Ingrowing nail: L60.0

## 2015-08-16 NOTE — Progress Notes (Signed)
    Subjective:  Janice Ramos is a 74 y.o. female [facility] with a chief complaint of ingrown toenail.   HPI:  Ingrown Toenail Patient presents with long standing history of ingrown toenails of her left 4th and 5th toes, that has become significantly more painful over the past 4 days. Has tried antibiotic ointment which has not helped. Pain is worse with movement. No drainage noted. No recent trauma. Has previously had big toenails removed bilaterally. Wishes to have toenails removed today. No fevers or chills.   ROS: Per HPI  Objective:  Physical Exam: BP 149/72 mmHg  Pulse 85  Temp(Src) 98.3 F (36.8 C) (Oral)  Wt 145 lb 11.2 oz (66.089 kg)  Gen: NAD, resting comfortably CV: RRR with no murmurs appreciated Lungs: NWOB, CTAB with no crackles, wheezes, or rhonchi MSK:  - Left 4th and 5th toes with significant onychomycosis and ingrown toenails. No purulence or drainage noted.  Skin: warm, dry Neuro: grossly normal, moves all extremities Psych: Normal affect and thought content   Procedure Note - Toenail Removal  Informed consent wass obtained. Digital block was performed on the left 4th and 5th toes with 5cc of 1% lidocaine without epinephrine. The site was sterilized and draped in the usual fashion. A tourniquet was placed for hemostasis over each digit. Each nail was freed and removed with minimal bleeding. Phenol was applied to each nail bed. Antibiotic ointment and a dressing were applied. Patient tolerated the procedure well with no complications.   Assessment/Plan:  Ingrown toenail without infection Patient with ingrown toenails of her left 4th and 5th digits. No signs of infection. Toenails removed per procedure note above. Return precautions reviewed.     Katina Degreealeb M. Jimmey RalphParker, MD [facility] Resident PGY-2 08/16/2015 4:47 PM

## 2015-08-16 NOTE — Patient Instructions (Signed)
Fingernail or Toenail Removal, Care After Refer to this sheet in the next few weeks. These instructions provide you with information about caring for yourself after your procedure. Your health care provider may also give you more specific instructions. Your treatment has been planned according to current medical practices, but problems sometimes occur. Call your health care provider if you have any problems or questions after your procedure. WHAT TO EXPECT AFTER THE PROCEDURE After your procedure, it is common to have:  Redness.  Swelling. HOME CARE INSTRUCTIONS  If you have a splint on your finger:  Wear it as directed by your health care provider. Remove it only as directed by your health care provider.  Loosen the splint if your fingers become numb and tingle, or if they turn cold and blue.  If you were given a surgical shoe, wear it as directed by your health care provider.  Take medicines only as directed by your health care provider.  Elevate your hand or foot as much of the time as possible. This helps with pain and swelling.  If you are recovering from fingernail removal, keep your hand raised above the level of your heart.  If you are recovering from toenail removal, lie on a bed or a couch with your leg propped up on pillows, or sit in a reclining chair with the footrest up.  Follow instructions from your health care provider about bandage (dressing) changes and removal:  Change your dressing 24 hours after your procedure or as directed by your health care provider.  Soak your hand or foot in warm, soapy water for 10-20 minutes or as directed by your health care provider. Do this 3 times per day or as directed by your health care provider. This reduces pain and swelling.  After you soak your hand or foot, apply a clean, dry dressing.  Keep your dressing clean and dry. Change your dressing whenever it gets wet or dirty.  Keep all follow-up visits as directed by your  health care provider. This is important. SEEK MEDICAL CARE IF:  You have increased redness or pain at your nail area.  You have increased fluid, blood, or pus coming from your nail area.  There is a bad smell coming from the dressing.  You have a fever.  Your swelling gets worse, or you have swelling that spreads from your finger to your hand or from your toe to your foot.  You have worsening redness that spreads from your finger to your hand or from your toe up to your foot.  Your finger or toe looks blue or black.   This information is not intended to replace advice given to you by your health care provider. Make sure you discuss any questions you have with your health care provider.   Document Released: 09/15/2014 Document Reviewed: 09/15/2014 Elsevier Interactive Patient Education 2016 Elsevier Inc.  

## 2015-08-16 NOTE — Assessment & Plan Note (Addendum)
Patient with ingrown toenails of her left 4th and 5th digits. No signs of infection. Toenails removed per procedure note above. Return precautions reviewed.

## 2015-08-21 ENCOUNTER — Ambulatory Visit (INDEPENDENT_AMBULATORY_CARE_PROVIDER_SITE_OTHER): Payer: Medicare Other | Admitting: Family Medicine

## 2015-08-21 VITALS — BP 146/77 | HR 94 | Temp 98.7°F | Ht 62.0 in | Wt 145.5 lb

## 2015-08-21 DIAGNOSIS — L600 Ingrowing nail: Secondary | ICD-10-CM

## 2015-08-21 MED ORDER — CEPHALEXIN 500 MG PO CAPS: 500.0000 mg | ORAL_CAPSULE | Freq: Four times a day (QID) | ORAL | Status: DC

## 2015-08-21 NOTE — Patient Instructions (Signed)
Soak in a solution of water mixed with 1 to 2 teaspoons of Epsom salts. Do this twice per day. Change the bandage after each soak. Just gently cover it. Take the keflex 500mg  four times per day for 1 week Return if not getting better  Be well, Dr. Pollie MeyerMcIntyre

## 2015-08-21 NOTE — Progress Notes (Signed)
Date of Visit: 08/21/2015   HPI:  Pt presents for a same day appointment to discuss infected toenails.  [facility] Dr. Jimmey RalphParker, underwent nail removal with matrix destruction on left fourth and fifth toes. Patient reports being given post procedure instructions on a handout, but is concerned that these were not explained to her in detail so she's unsure if she's caring for the toes correctly. Has had pain in the toes with some foul smelling drainage. Has difficulty with reaching her feet to apply bandages. Also did not have any bandages at home and reports displeasure with not receiving any last appointment. She is concerned that the toes are infected. No fevers. Normal PO intake.   ROS: See HPI  PMFSH: history of hyperlipidemia, depression, HSV-1 & 2, bipolar disorder, hypertension. Denies history of diabetes.  PHYSICAL EXAM: BP 146/77 mmHg  Pulse 94  Temp(Src) 98.7 F (37.1 C) (Oral)  Ht 5\' 2"  (1.575 m)  Wt 145 lb 8 oz (65.998 kg)  BMI 26.61 kg/m2 Gen: NAD, well appearing female Extremity: L foot with 4th and 5th toes with moist nailbed, absence of nail itself. Mildly erythematous but no streaking or purulent drainage. nontender to palpation. Good DP pulse in L foot.   ASSESSMENT/PLAN:  Ingrown toenail No clear signs of infection on exam, but with report of significant pain and foul smelling drainage, will proceed with oral antibiotic coverage.  - rx keflex 500 four times a day for 7 days. - reviewed process for performing epsom salt soaks and gave patient bandages to use - given rigid sole shoe to wear for protection of toes - return precautions discussed, follow up as needed   FOLLOW UP: F/u as needed if symptoms worsen or do not improve.   Janice J. Pollie MeyerMcIntyre, MD [facility]

## 2015-08-22 NOTE — Assessment & Plan Note (Addendum)
No clear signs of infection on exam, but with report of significant pain and foul smelling drainage, will proceed with oral antibiotic coverage.  - rx keflex 500 four times a day for 7 days. - reviewed process for performing epsom salt soaks and gave patient bandages to use - given rigid sole shoe to wear for protection of toes - return precautions discussed, follow up as needed

## 2015-10-23 ENCOUNTER — Ambulatory Visit: Payer: Medicare Other | Admitting: Podiatry

## 2015-11-26 ENCOUNTER — Encounter: Payer: Self-pay | Admitting: Family Medicine

## 2015-11-26 ENCOUNTER — Ambulatory Visit (INDEPENDENT_AMBULATORY_CARE_PROVIDER_SITE_OTHER): Payer: Medicare Other | Admitting: Family Medicine

## 2015-11-26 ENCOUNTER — Other Ambulatory Visit: Payer: Self-pay | Admitting: Family Medicine

## 2015-11-26 VITALS — BP 147/85 | HR 102 | Temp 98.7°F | Ht 62.0 in | Wt 147.3 lb

## 2015-11-26 DIAGNOSIS — I10 Essential (primary) hypertension: Secondary | ICD-10-CM

## 2015-11-26 DIAGNOSIS — L301 Dyshidrosis [pompholyx]: Secondary | ICD-10-CM

## 2015-11-26 LAB — POCT SKIN KOH: SKIN KOH, POC: NEGATIVE

## 2015-11-26 MED ORDER — HYDROCHLOROTHIAZIDE 25 MG PO TABS: 25.0000 mg | ORAL_TABLET | Freq: Every day | ORAL | Status: DC

## 2015-11-26 MED ORDER — PREDNISONE 50 MG PO TABS: 50.0000 mg | ORAL_TABLET | Freq: Every day | ORAL | Status: DC

## 2015-11-26 MED ORDER — LISINOPRIL 10 MG PO TABS: 10.0000 mg | ORAL_TABLET | Freq: Every day | ORAL | Status: DC

## 2015-11-26 MED ORDER — CLOBETASOL PROPIONATE 0.05 % EX OINT: 1.0000 "application " | TOPICAL_OINTMENT | Freq: Two times a day (BID) | CUTANEOUS | Status: DC | PRN

## 2015-11-26 NOTE — Patient Instructions (Signed)
Take prednisone 50mg  daily for 7 days Follow up here with us in 1 week, or with your podiatrist  Also sent in steroid ointment for you to apply  It will be important for you to see the podiatrist at some point.  See handout below, the same information applies to the feet.  Return if worsening, fevers, drainage, etc.  Be well, Dr. Pollie MeyerMcIntyre   Hand Dermatitis Hand dermatitis (dyshidrotic eczema) is a skin condition in which small, itchy, raised dots or fluid-filled blisters form over the palms of the hands. Outbreaks of hand dermatitis can last 3 to 4 weeks. CAUSES  The cause of hand dermatitis is unknown. However, it occurs most often in patients with a history of allergies such as:  Hay fever.  Allergic asthma.  Allergies to latex. Chemical exposure, injuries, and environmental irritants can make hand dermatitis worse. Washing your hands too frequently can remove natural oils, which can dry out the skin and contribute to outbreaks of hand dermatitis. SYMPTOMS  The most common symptom of hand dermatitis is intense itching. Cracks or grooves (fissures) on the fingers can also develop. Affected areas can be painful, especially areas where large blisters have formed. DIAGNOSIS Your caregiver can usually tell what the problem is by doing a physical exam. PREVENTION  Avoid excessive hand washing.  Avoid the use of harsh chemicals.  Wear protective gloves when handling products that can irritate your skin. TREATMENT  Steroid creams and ointments, such as over-the-counter 1% hydrocortisone cream, can reduce inflammation and improve moisture retention. These should be applied at least 2 to 4 times per day. Your caregiver may ask you to use a stronger prescription steroid cream to help speed the healing of blistered and cracked skin. In severe cases, oral steroid medicine may be needed. If you have an infection, antibiotics may be needed. Your caregiver may also prescribe antihistamines.  These medicines help reduce itching. HOME CARE INSTRUCTIONS  Only take over-the-counter or prescription medicines as directed by your caregiver.  You may use wet or cold compresses. This can help:  Alleviate itching.  Increase the effectiveness of topical creams.  Minimize blisters. SEEK MEDICAL CARE IF:  The rash is not better after 1 week of treatment.  Signs of infection develop, such as redness, tenderness, or yellowish-white fluid (pus).  The rash is spreading.   This information is not intended to replace advice given to you by your health care provider. Make sure you discuss any questions you have with your health care provider.   Document Released: 08/25/2005 Document Revised: 11/17/2011 Document Reviewed: 03/09/2015 Elsevier Interactive Patient Education [employer]2016 Elsevier Inc.

## 2015-11-26 NOTE — Progress Notes (Signed)
Date of Visit: 11/26/2015   HPI:  Patient presents to discuss pain and itching to the bottom of her feet. Reports this has been ongoing for months and that she is just now seeking care for it. On review of records she did [facility] RalphParker [facility] in November and was given rx for topical clotrimazole ointment.   Reports itching and pain. Has tried Odor-X, over the counter eczema lotion, almond and coconut oil, and olive oil without relief. No fevers. On review of records has also had dyshidrotic eczema of hands in the past.   ROS: See HPI.  PMFSH: history of hypertension, trochanteric bursitis, HSV, bipolar disorder, major depression, prediabetes  PHYSICAL EXAM: BP 147/85 mmHg  Pulse 102  Temp(Src) 98.7 F (37.1 C) (Oral)  Ht 5\' 2"  (1.575 m)  Wt 147 lb 4.8 oz (66.815 kg)  BMI 26.93 kg/m2 Gen: NAD, pleasant, cooperative HEENT: normocephalic, atraumatic  Skin: bilateral plantar aspect of feet with significant crusting, blistering, and skin thickening around forefoot. 2+ dp pulses bilaterally. Full ROM of toes.  ASSESSMENT/PLAN:  Dyshidrotic foot dermatitis Differential diagnosis includes dyshidrotic dermatitis versus fungal infection. KOH prep negative today, and was negative back in November. Favor dyshidrotic eczema. Given severity, will treat with systemic steroids as well as topical clobetasol ointment. [facility] week [facility] or with podiatrist. Encouraged patient to schedule follow up with podiatrist either way.     FOLLOW UP: Follow up in 1 week for feet dermatitis  GrenadaBrittany J. Pollie MeyerMcIntyre, MD [facility]

## 2015-11-26 NOTE — Telephone Encounter (Signed)
Needs refill on lisonpril and HCTZ.  [facility] [address]. Please call pt when the RX have been sent in

## 2015-11-29 DIAGNOSIS — L301 Dyshidrosis [pompholyx]: Secondary | ICD-10-CM | POA: Insufficient documentation

## 2015-11-29 NOTE — Assessment & Plan Note (Signed)
Differential diagnosis includes dyshidrotic dermatitis versus fungal infection. KOH prep negative today, and was negative back in November. Favor dyshidrotic eczema. Given severity, will treat with systemic steroids as well as topical clobetasol ointment. Follow [facility] in 1 week [facility] or with podiatrist. Encouraged patient to schedule follow up with podiatrist either way.

## 2015-12-12 ENCOUNTER — Encounter: Payer: Self-pay | Admitting: Family Medicine

## 2015-12-12 ENCOUNTER — Ambulatory Visit (INDEPENDENT_AMBULATORY_CARE_PROVIDER_SITE_OTHER): Payer: Medicare Other | Admitting: Family Medicine

## 2015-12-12 VITALS — BP 143/76 | HR 92 | Temp 98.6°F | Ht 62.0 in | Wt 145.2 lb

## 2015-12-12 DIAGNOSIS — L089 Local infection of the skin and subcutaneous tissue, unspecified: Secondary | ICD-10-CM | POA: Diagnosis not present

## 2015-12-12 DIAGNOSIS — L301 Dyshidrosis [pompholyx]: Secondary | ICD-10-CM | POA: Diagnosis present

## 2015-12-12 LAB — COMPREHENSIVE METABOLIC PANEL
ALT: 17 U/L (ref 6–29)
AST: 16 U/L (ref 10–35)
Albumin: 4.3 g/dL (ref 3.6–5.1)
Alkaline Phosphatase: 64 U/L (ref 33–130)
BILIRUBIN TOTAL: 0.4 mg/dL (ref 0.2–1.2)
BUN: 18 mg/dL (ref 7–25)
CO2: 30 mmol/L (ref 20–31)
CREATININE: 0.73 mg/dL (ref 0.60–0.93)
Calcium: 9.4 mg/dL (ref 8.6–10.4)
Chloride: 96 mmol/L — ABNORMAL LOW (ref 98–110)
Glucose, Bld: 84 mg/dL (ref 65–99)
Potassium: 4 mmol/L (ref 3.5–5.3)
SODIUM: 133 mmol/L — AB (ref 135–146)
Total Protein: 7.1 g/dL (ref 6.1–8.1)

## 2015-12-12 MED ORDER — TERBINAFINE HCL 250 MG PO TABS: 250.0000 mg | ORAL_TABLET | Freq: Every day | ORAL | Status: DC

## 2015-12-12 NOTE — Patient Instructions (Signed)
Start terbinafine 1 pill daily for 2 weeks Follow up here in 2 weeks  Please schedule an appointment with your podiatrist  If this doesn't help you will need to see a dermatologist  Be well, Dr. Pollie MeyerMcIntyre

## 2015-12-13 ENCOUNTER — Encounter: Payer: Self-pay | Admitting: Family Medicine

## 2015-12-13 NOTE — Assessment & Plan Note (Signed)
Reported worsening with prednisone and clobetasol raises the diagnosis into question. Despite KOH prep being negative x2, will do trial of systemic antifungal therapy with terbinafine. Check CMET today to establish baseline LFT's as she may need an extended course. Will start with 2 weeks of terbinafine. Follow up here in 2 weeks, also needs follow up with her podiatrist. Patient agreeable to this plan.

## 2015-12-13 NOTE — Progress Notes (Signed)
Date of Visit: 12/12/2015   HPI:  Patient presents for follow up of dyshidrosis of feet. [facility] and prescribed systemic prednisone as well as topical clobetasol. She reports that these medications only made it worse. She's very upset by this. The areas of pain and slight erythema on her plantar surfaces of her feet have slowly spread upwards and she estimates theyre about 1cm higher now. Still has lots of pain.  She has not seen her podiatrist.  ROS: See HPI.  PMFSH: history of hypertension, trochanteric bursitis, HSV, bipolar disorder, major depression, prediabetes  PHYSICAL EXAM: BP 143/76 mmHg  Pulse 92  Temp(Src) 98.6 F (37 C) (Oral)  Ht 5\' 2"  (1.575 m)  Wt 145 lb 3.2 oz (65.862 kg)  BMI 26.55 kg/m2 Gen: NAD, cooperative. Appears frustrated. Extremities: bilateral feet with severe scaling and skin thickening over plantar aspect of feet. Some slight erythema and blisters on periphery of plantar feet. 2+ DP pulses. Warm and well perfused.  ASSESSMENT/PLAN:  Dyshidrotic foot dermatitis Reported worsening with prednisone and clobetasol raises the diagnosis into question. Despite KOH prep being negative x2, will do trial of systemic antifungal therapy with terbinafine. Check CMET today to establish baseline LFT's as she may need an extended course. Will start with 2 weeks of terbinafine. Follow up here in 2 weeks, also needs follow up with her podiatrist. Patient agreeable to this plan.   FOLLOW UP: Follow up in 2 weeks for foot dermatitis Schedule appointment with podiatrist.  Estevan RyderBrittany J. Pollie MeyerMcIntyre, MD [facility]

## 2015-12-25 ENCOUNTER — Ambulatory Visit: Payer: Medicare Other | Admitting: Family Medicine

## 2016-01-10 ENCOUNTER — Ambulatory Visit (INDEPENDENT_AMBULATORY_CARE_PROVIDER_SITE_OTHER): Payer: Medicare Other | Admitting: Podiatry

## 2016-01-10 ENCOUNTER — Encounter: Payer: Self-pay | Admitting: Podiatry

## 2016-01-10 VITALS — BP 148/92 | HR 84 | Resp 12

## 2016-01-10 DIAGNOSIS — L309 Dermatitis, unspecified: Secondary | ICD-10-CM | POA: Diagnosis not present

## 2016-01-10 MED ORDER — FLUOCINONIDE-E 0.05 % EX CREA: 1.0000 "application " | TOPICAL_CREAM | Freq: Two times a day (BID) | CUTANEOUS | Status: DC

## 2016-01-10 NOTE — Progress Notes (Signed)
Subjective:     Patient ID: Janice Ramos, female   DOB: June 03, 1941, 75 y.o.   MRN: [id number]  HPI this patient presents to the office with chief complaint of a skin rash noted on the bottom of both forefeet. She says she is been experiencing this rash for up to 7 months. She states that the rash becomes inflamed and breaks open and drains at time. At the other times she has dry  Scaly skin.Marland Kitchen. Her skin with normal red newly formed skin underneath the peeling skin.  She says today is a good day and her foot is better today.  She has been treated by other doctors with different medicines. Examination of her medicine list does reveal they have treated her with clobetasol  Ointment,. Clotrimazole, terbinafine all in an effort to heal her rash.She presents to the office today for evaluation and treatment. She has been diagnosed by her previous doctor as dyshydrotic dermatitis.   Review of Systems     Objective:   Physical Exam GENERAL APPEARANCE: Alert, conversant. Appropriately groomed. No acute distress.  VASCULAR: Pedal pulses are  [facility] at  [facility].  Capillary refill time is immediate to all digits,  Normal temperature gradient.  Digital hair growth is present bilateral  NEUROLOGIC: sensation is normal to 5.07 monofilament at 5/5 sites bilateral.  Light touch is intact bilateral, Muscle strength normal.  MUSCULOSKELETAL: acceptable muscle strength, tone and stability bilateral.  Intrinsic muscluature intact bilateral.  Mild HAV deformity both feet.  DERMATOLOGIC: Patient has red inflamed skin extending from her toes past her midfoot. She has dramatic peeling noted on both feet.  This rash includes the plantar aspect of both feet. No evidence of drainage at the visit today.  There is fissure present under left hallux.  No swelling noted.      Assessment:     Dermatitis    Plan:     IE  Told patient to use vaseline in addition to prescribing Lidex cream.  RTC 1 week. This  dermatis is bilateral.  Possible allergic reaction to unknown allergen.  Told patient to think about possible causes.  She believes this is stress related.  This does not appear to be fungal in nature.     Helane GuntherGregory Hermon Zea DPM   Helane GuntherGregory Tareva Leske DPM

## 2016-01-17 ENCOUNTER — Ambulatory Visit: Payer: Medicare Other | Admitting: Podiatry

## 2016-01-24 ENCOUNTER — Ambulatory Visit (INDEPENDENT_AMBULATORY_CARE_PROVIDER_SITE_OTHER): Payer: Medicare Other | Admitting: Podiatry

## 2016-01-24 ENCOUNTER — Encounter: Payer: Self-pay | Admitting: Podiatry

## 2016-01-24 VITALS — BP 180/108 | HR 88 | Resp 14

## 2016-01-24 DIAGNOSIS — L309 Dermatitis, unspecified: Secondary | ICD-10-CM | POA: Diagnosis not present

## 2016-01-24 NOTE — Progress Notes (Signed)
Subjective:     Patient ID: Janice Ramos, female   DOB: 1940-10-24, 75 y.o.   MRN: [id number]  HPI this patient presents to the office with chief complaint of a skin rash noted on the bottom of both forefeet. She says she is been experiencing this rash for up to 7 months but in the last week it has started to improve.  She was given a prescription for lidex and told her to soak her foot.  She says her rash is correlated to her stress level.  She says she has been caring for her feet better with soaks and  using the lidex and dealing with her stress better.  She has noted marked improvement of her feet. She presents for continued evaluation and treatment.   Review of Systems     Objective:   Physical Exam GENERAL APPEARANCE: Alert, conversant. Appropriately groomed. No acute distress.  VASCULAR: Pedal pulses are  palpable at  [facility] bilateral.  Capillary refill time is immediate to all digits,  Normal temperature gradient.  Digital hair growth is present bilateral  NEUROLOGIC: sensation is normal to 5.07 monofilament at 5/5 sites bilateral.  Light touch is intact bilateral, Muscle strength normal.  MUSCULOSKELETAL: acceptable muscle strength, tone and stability bilateral.  Intrinsic muscluature intact bilateral.  Mild HAV deformity both feet.  DERMATOLOGIC: Patient has presence of non inflamed skin on the plantar aspect of her foreffet.  The redness is decreased but peeling of the skin has subsided but still present.      Assessment:     Dermatitis    Plan:     IE  Told patient to use vaseline in addition to prescribing Lidex cream.  RTC prn This dermatitis is improving.  Pain has diminished.  Continue to use medicine and soaks.       Helane GuntherGregory Russell Quinney DPM   Helane GuntherGregory Tavarius Grewe DPM

## 2016-05-02 ENCOUNTER — Other Ambulatory Visit: Payer: Self-pay | Admitting: *Deleted

## 2016-05-02 DIAGNOSIS — I10 Essential (primary) hypertension: Secondary | ICD-10-CM

## 2016-05-02 NOTE — Telephone Encounter (Signed)
Refill request for 90 day supply.  Hammad Finkler L, RN  

## 2016-05-03 MED ORDER — HYDROCHLOROTHIAZIDE 25 MG PO TABS: 25.0000 mg | ORAL_TABLET | Freq: Every day | ORAL | 3 refills | Status: DC

## 2016-05-03 MED ORDER — LISINOPRIL 10 MG PO TABS: 10.0000 mg | ORAL_TABLET | Freq: Every day | ORAL | 3 refills | Status: DC

## 2016-05-06 ENCOUNTER — Ambulatory Visit: Payer: Medicare Other | Admitting: Family Medicine

## 2016-05-06 NOTE — Progress Notes (Deleted)
   Subjective:   Patient ID: Janice Ramos    DOB: 09/27/1940, 75 y.o. female   MRN: [id number]  CC: ***  HPI: Janice Ramos is a 75 y.o. female who presents to clinic today ***. Problems discussed today are as follows:  ROS: See HPI for pertinent findings.  PMFSH: Pertinent past medical, surgical, family, and social history were reviewed and updated as appropriate. Smoking status reviewed.  Medications reviewed. Current Outpatient Prescriptions  Medication Sig Dispense Refill  . ACYCLOVIR PO Take 500 mg by mouth daily.    Marland Kitchen. ALPRAZolam (XANAX) 0.5 MG tablet Take 0.5 mg by mouth at bedtime as needed. For insomnia/anxiety     . clobetasol ointment (TEMOVATE) 0.05 % Apply 1 application topically 2 (two) times daily as needed. 30 g 0  . fluocinonide-emollient (LIDEX-E) 0.05 % cream Apply 1 application topically 2 (two) times daily. 30 g 0  . hydrochlorothiazide (HYDRODIURIL) 25 MG tablet Take 1 tablet (25 mg total) by mouth daily. 90 tablet 3  . ibuprofen (ADVIL,MOTRIN) 800 MG tablet Take 1 tablet (800 mg total) by mouth every 8 (eight) hours as needed. 30 tablet 0  . lisinopril (PRINIVIL,ZESTRIL) 10 MG tablet Take 1 tablet (10 mg total) by mouth daily. 90 tablet 3  . methylphenidate (RITALIN) 20 MG tablet Take 20 mg by mouth 2 (two) times daily.  0  . naproxen sodium (ANAPROX) 220 MG tablet Take 220 mg by mouth as needed.    . predniSONE (DELTASONE) 50 MG tablet Take 1 tablet (50 mg total) by mouth daily with breakfast. 7 tablet 0  . traZODone (DESYREL) 50 MG tablet Take 50 mg by mouth at bedtime. Per [facility]     . valACYclovir (VALTREX) 500 MG tablet Take 500 mg by mouth daily.  12  . venlafaxine (EFFEXOR-XR) 150 MG 24 hr capsule Take 300 mg by mouth daily. Per [facility] [address]      No current facility-administered medications for this visit.     Objective:   There were no vitals taken for this visit. Vitals and nursing note  reviewed.  General: well nourished, well developed, in no acute distress with non-toxic appearance HEENT: normocephalic, atraumatic, moist mucous membranes Neck: supple, non-tender without lymphadenopathy CV: regular rate and rhythm without murmurs rubs or gallops Lungs: clear to auscultation bilaterally with normal work of breathing Abdomen: soft, non-tender, no masses or organomegaly palpable, normoactive bowel sounds Skin: warm, dry, no rashes or lesions, cap refill < 2 seconds Extremities: warm and well perfused, normal tone  Assessment & Plan:   No problem-specific Assessment & Plan notes found for this encounter.  No orders of the defined types were placed in this encounter.  No orders of the defined types were placed in this encounter.   Freddrick MarchYashika Orianna Biskup, MD [facility], PGY-1 05/06/2016

## 2016-09-05 ENCOUNTER — Ambulatory Visit (INDEPENDENT_AMBULATORY_CARE_PROVIDER_SITE_OTHER): Payer: Medicare Other | Admitting: Internal Medicine

## 2016-09-05 ENCOUNTER — Encounter: Payer: Self-pay | Admitting: Internal Medicine

## 2016-09-05 DIAGNOSIS — R21 Rash and other nonspecific skin eruption: Secondary | ICD-10-CM | POA: Insufficient documentation

## 2016-09-05 MED ORDER — HYDROCORTISONE 2.5 % EX CREA: TOPICAL_CREAM | Freq: Two times a day (BID) | CUTANEOUS | 0 refills | Status: DC

## 2016-09-05 MED ORDER — HYDROXYZINE HCL 10 MG PO TABS: 10.0000 mg | ORAL_TABLET | Freq: Three times a day (TID) | ORAL | 0 refills | Status: DC | PRN

## 2016-09-05 MED ORDER — CLOBETASOL PROPIONATE 0.05 % EX OINT: 1.0000 "application " | TOPICAL_OINTMENT | Freq: Two times a day (BID) | CUTANEOUS | 0 refills | Status: DC | PRN

## 2016-09-05 MED ORDER — VALACYCLOVIR HCL 500 MG PO TABS: 500.0000 mg | ORAL_TABLET | Freq: Every day | ORAL | 0 refills | Status: DC

## 2016-09-05 NOTE — Assessment & Plan Note (Signed)
Does not appear particularly vesicular in nature but given history of HSV will cover for potential re-activation of virus with Valtrex 500 mg BID x3 days. Will also prescribe hydrocortisone cream for inflammation of the face.

## 2016-09-05 NOTE — Patient Instructions (Signed)
Apply the Hydrocortisone cream to your face. Apply the Clobetasol to your body.   Take the Atarax as needed for itching.   Take Valacyclovir twice per day for 3 days.

## 2016-09-05 NOTE — Assessment & Plan Note (Signed)
Areas on the hands seem consistent with dyshidrotic eczema and patient has a history of this as well. Gave refill on Clobetasol ointment. Other areas of complaint are without significant skin exam findings and suspect psychogenic component is playing a large part. Prescribed Atarax for itching.

## 2016-09-05 NOTE — Progress Notes (Signed)
   Subjective:    Janice Ramos - 75 y.o. female MRN [id number]  Date of birth: [birth year]  HPI  Janice Ramos is here for SDA for multiple skin complaints.  Patient reports she has facial lesions for several weeks. Has also been itching on her arms and back. Reports that she is concerned for parasites because she has seen small black worm like things come out of the lesions on her arm. Also thinks she has gotten several spider bites. 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 She also has a history of eczema but reports this is different.    -  reports that she has never smoked. She has never used smokeless tobacco. - Review of Systems: Per HPI. - Past Medical History: Patient Active Problem List   Diagnosis Date Noted  . Facial rash 09/05/2016  . Rash and nonspecific skin eruption 09/05/2016  . 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 Ingrown toenail 08/16/2015  . Rash 08/06/2015  . Seborrheic dermatitis of scalp 01/30/2015  . Hyponatremia 01/30/2015  . Loss of appetite 01/30/2015  . Lip lesion 10/16/2014  . Hair loss 10/16/2014  . Lumbar pain 06/29/2014  . Trochanteric bursitis of right hip 06/21/2014  . Greater trochanteric bursitis of left hip 05/31/2014  . Healthcare maintenance 05/31/2014  . Axillary mass 05/31/2014  . 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 Right hip pain 10/10/2013  . Piriformis syndrome of right side 06/10/2013  . Dysuria 11/25/2012  . Syncopal episodes 04/06/2012  . Headache 12/10/2011  . Wrist pain, chronic 11/17/2011  . Thumb pain 08/06/2011  . Trochanteric bursitis 06/22/2011  . 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 Fatigue 03/26/2011  . Weight loss 03/26/2011  . Pelvic pain in female 12/17/2010  . Abdominal pain 12/17/2010  . Urinary incontinence 11/12/2010  .  GENITAL HERPES 08/05/2010  . INSOMNIA 01/26/2009  . Pre-diabetes 12/15/2008  . DEPRESSION, MAJOR 05/17/2008  . ESSENTIAL HYPERTENSION, BENIGN 05/17/2008  . HYPERCHOLESTEROLEMIA 11/05/2006  . BIPOLAR DISORDER 11/05/2006   - Medications: reviewed and updated   Objective:   Physical Exam BP 140/70   Pulse 98   Temp 98.3 F (36.8 C) (Oral)   Ht 5\' 2"  (1.575 m)   Wt 132 lb (59.9 kg)   SpO2 98%   BMI 24.14 kg/m  Gen: appears anxious, non-toxic  Skin: papular, erythematous lesions on chin with overlying excoriations and peeling skin, dry cracked palms of hands and fingers bilaterally, very scattered few scabbed over areas on the arms bilaterally and upper back  Psych: anxious, tangential thinking     Assessment & Plan:   Facial rash Does not appear particularly vesicular in nature but given history of HSV will cover for potential re-activation of virus with Valtrex 500 mg BID x3 days. Will also prescribe hydrocortisone cream for inflammation of the face.   Rash and nonspecific skin eruption Areas on the hands seem consistent with dyshidrotic eczema and patient has a history of this as well. Gave refill on Clobetasol ointment. Other areas of complaint are without significant skin exam findings and suspect psychogenic component is playing a large part. Prescribed Atarax for itching.     Marcy Sirenatherine Apollos Tenbrink, D.O. 09/05/2016, 4:55 PM PGY-2, [facility]

## 2017-01-05 ENCOUNTER — Ambulatory Visit (HOSPITAL_COMMUNITY)
Admission: EM | Admit: 2017-01-05 | Discharge: 2017-01-05 | Disposition: A | Discharge status: Home / Self Care | Payer: Medicare Other | Attending: Internal Medicine | Admitting: Internal Medicine

## 2017-01-05 ENCOUNTER — Encounter (HOSPITAL_COMMUNITY): Payer: Self-pay | Admitting: Emergency Medicine

## 2017-01-05 DIAGNOSIS — S30861A Insect bite (nonvenomous) of abdominal wall, initial encounter: Secondary | ICD-10-CM | POA: Diagnosis not present

## 2017-01-05 DIAGNOSIS — W57XXXA Bitten or stung by nonvenomous insect and other nonvenomous arthropods, initial encounter: Secondary | ICD-10-CM | POA: Diagnosis not present

## 2017-01-05 MED ORDER — DOXYCYCLINE HYCLATE 100 MG PO CAPS: 100.0000 mg | ORAL_CAPSULE | Freq: Two times a day (BID) | ORAL | 0 refills | Status: DC

## 2017-01-05 NOTE — ED Triage Notes (Signed)
Patient has a tick on abdomen that was noticed 3 days ago.  Denies a rash, feels, patient does have a headache

## 2017-01-05 NOTE — Discharge Instructions (Signed)
Return if any problems.  See your Physician for recheck in 1 week   °

## 2017-01-06 NOTE — ED Provider Notes (Signed)
[facility]-EMER[facility]Y DEPT Provider Note   CSN: [id number] Arrival date & time: 01/05/17  1306     History   Chief Complaint Chief Complaint  Patient presents with  . Tick Removal    HPI Janice Ramos is a 76 y.o. female.  Pt reports she has a dark spot on her abdomen.  Pt reports she had a tick there.  Pt reports she dug it out and now area has redness around it.  Pt denies fever,  Pt thinks she got tick out.  No nausea no vomitting   The history is provided by the patient. No language interpreter was used.    Past Medical History:  Diagnosis Date  . Allergy   . Bipolar 1 disorder (HCC)   . Depression   . Diabetes mellitus   . Genital herpes in women 1983  . Hyperlipidemia   . Irritable bowel syndrome (IBS)   . Overdose of benzodiazepine 06/2005   Xanax  . Sinus disorder     Patient Active Problem List   Diagnosis Date Noted  . Facial rash 09/05/2016  . Rash and nonspecific skin eruption 09/05/2016  . 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 Ingrown toenail 08/16/2015  . Rash 08/06/2015  . Seborrheic dermatitis of scalp 01/30/2015  . Hyponatremia 01/30/2015  . Loss of appetite 01/30/2015  . Lip lesion 10/16/2014  . Hair loss 10/16/2014  . Lumbar pain 06/29/2014  . Trochanteric bursitis of right hip 06/21/2014  . Greater trochanteric bursitis of left hip 05/31/2014  . Healthcare maintenance 05/31/2014  . Axillary mass 05/31/2014  . 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 Right hip pain 10/10/2013  . Piriformis syndrome of right side 06/10/2013  . Dysuria 11/25/2012  . Syncopal episodes 04/06/2012  . Headache 12/10/2011  . Wrist pain, chronic 11/17/2011  . Thumb pain 08/06/2011  . Trochanteric bursitis 06/22/2011  . 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 Fatigue 03/26/2011  . Weight loss 03/26/2011  . Pelvic pain in female 12/17/2010  .  Abdominal pain 12/17/2010  . Urinary incontinence 11/12/2010  . GENITAL HERPES 08/05/2010  . INSOMNIA 01/26/2009  . Pre-diabetes 12/15/2008  . DEPRESSION, MAJOR 05/17/2008  . ESSENTIAL HYPERTENSION, BENIGN 05/17/2008  . HYPERCHOLESTEROLEMIA 11/05/2006  . BIPOLAR DISORDER 11/05/2006    Past Surgical History:  Procedure Laterality Date  . CATARACT EXTRACTION    . Cataract Surgery     L eye 12/2008, R eye 05/2009 [facility]  . EYE SURGERY    . TUBAL LIGATION      OB History    No data available       Home Medications    Prior to Admission medications   Medication Sig Start Date End Date Taking? Authorizing Provider  ACYCLOVIR PO Take 500 mg by mouth daily.    Historical Provider, MD  ALPRAZolam Prudy Feeler) 0.5 MG tablet Take 0.5 mg by mouth at bedtime as needed. For insomnia/anxiety     Historical Provider, MD  clobetasol ointment (TEMOVATE) 0.05 % Apply 1 application topically 2 (two) times daily as needed. 09/05/16   Arvilla Market, DO  doxycycline (VIBRAMYCIN) 100 MG capsule Take 1 capsule (100 mg total) by mouth 2 (two) times daily. 01/05/17   Elson Areas, PA-C  fluocinonide-emollient (LIDEX-E) 0.05 % cream Apply 1 application topically 2 (two) times daily. 01/10/16   Helane Gunther, DPM  hydrochlorothiazide (HYDRODIURIL) 25 MG tablet Take 1 tablet (25  mg total) by mouth daily. 05/03/16   Marquette Saa, MD  hydrocortisone 2.5 % cream Apply topically 2 (two) times daily. 09/05/16   Arvilla Market, DO  hydrOXYzine (ATARAX/VISTARIL) 10 MG tablet Take 1 tablet (10 mg total) by mouth 3 (three) times daily as needed. 09/05/16   Arvilla Market, DO  ibuprofen (ADVIL,MOTRIN) 800 MG tablet Take 1 tablet (800 mg total) by mouth every 8 (eight) hours as needed. 10/10/13   Elenora Gamma, MD  lisinopril (PRINIVIL,ZESTRIL) 10 MG tablet Take 1 tablet (10 mg total) by mouth daily. 05/03/16   Marquette Saa, MD  methylphenidate (RITALIN) 20 MG tablet  Take 20 mg by mouth 2 (two) times daily. 11/09/15   Historical Provider, MD  naproxen sodium (ANAPROX) 220 MG tablet Take 220 mg by mouth as needed.    Historical Provider, MD  predniSONE (DELTASONE) 50 MG tablet Take 1 tablet (50 mg total) by mouth daily with breakfast. 11/26/15   Latrelle Dodrill, MD  traZODone (DESYREL) 50 MG tablet Take 50 mg by mouth at bedtime. Per [facility]     Historical Provider, MD  valACYclovir (VALTREX) 500 MG tablet Take 1 tablet (500 mg total) by mouth daily. 09/05/16   Arvilla Market, DO  venlafaxine (EFFEXOR-XR) 150 MG 24 hr capsule Take 300 mg by mouth daily. Per [facility] on [address]     Historical Provider, MD    Family History Family History  Problem Relation Age of Onset  . Cancer Mother     Bone  . Suicidality Father     Social History Social History  Substance Use Topics  . Smoking status: Never Smoker  . Smokeless tobacco: Never Used  . Alcohol use 0.0 oz/week     Comment: 1 x per year     Allergies   Patient has no known allergies.   Review of Systems Review of Systems  All other systems reviewed and are negative.    Physical Exam Updated Vital Signs BP (!) 179/88 (BP Location: Right Arm)   Pulse 90   Temp 98.7 F (37.1 C) (Oral)   Resp 14   SpO2 100%   Physical Exam  Constitutional: She appears well-developed and well-nourished.  HENT:  Head: Normocephalic.  Eyes: EOM are normal. Pupils are equal, round, and reactive to light.  Cardiovascular: Normal rate.   Pulmonary/Chest: Effort normal.  Musculoskeletal: Normal range of motion.  Neurological: She is alert.  Skin: There is erythema.  3.5 cm erythematous area dark center. Appears to be a scab.    Psychiatric: She has a normal mood and affect.  Nursing note and vitals reviewed.    ED Treatments / Results  Labs (all labs ordered are listed, but only abnormal results are displayed) Labs Reviewed - No data to  display  EKG  EKG Interpretation None       Radiology No results found.  Procedures Procedures (including critical care time)  Medications Ordered in ED Medications - No data to display   Initial Impression / Assessment and Plan / ED Course  I have reviewed the triage vital signs and the nursing notes.  Pertinent labs & imaging results that were available during my care of the patient were reviewed by me and considered in my medical decision making (see chart for details).     I am concerned about infection.   I will treat pt with doxy to cover for tick related illnesses.  Pt advised of need  to see her primary for recheck  Final Clinical Impressions(s) / ED Diagnoses   Final diagnoses:  Tick bite of abdomen, initial encounter   Meds ordered this encounter  Medications  . doxycycline (VIBRAMYCIN) 100 MG capsule    Sig: Take 1 capsule (100 mg total) by mouth 2 (two) times daily.    Dispense:  20 capsule    Refill:  0    Order Specific Question:   Supervising Provider    Answer:   Eustace Moore [[id number]]   New Prescriptions Discharge Medication List as of 01/05/2017  3:03 PM    An After Visit Summary was printed and given to the patient.   Lonia Skinner Munson, PA-C 01/06/17 1750

## 2017-01-22 ENCOUNTER — Encounter: Payer: Self-pay | Admitting: Family Medicine

## 2017-01-22 ENCOUNTER — Ambulatory Visit (INDEPENDENT_AMBULATORY_CARE_PROVIDER_SITE_OTHER): Payer: Medicare Other | Admitting: Family Medicine

## 2017-01-22 DIAGNOSIS — R21 Rash and other nonspecific skin eruption: Secondary | ICD-10-CM

## 2017-01-22 MED ORDER — TRIAMCINOLONE ACETONIDE 0.1 % EX CREA: 1.0000 "application " | TOPICAL_CREAM | Freq: Two times a day (BID) | CUTANEOUS | 0 refills | Status: DC

## 2017-01-22 MED ORDER — HYDROXYZINE HCL 10 MG PO TABS: 10.0000 mg | ORAL_TABLET | Freq: Three times a day (TID) | ORAL | 2 refills | Status: DC | PRN

## 2017-01-22 NOTE — Progress Notes (Signed)
Subjective:    Patient ID: Janice Ramos , female   DOB: 1941/05/05 , 76 y.o..   MRN: [id number]  HPI  Janice Ramos is here for a same day visit for  Chief Complaint  Patient presents with  . Rash    1. RASH  Location: Face, chest, abdomen, back, legs.  Onset: Has been present for many months but got worse one month ago   Course: Patient initially seen in December 2017 for many skin lesion complaints. At that time it was thought that this may be due to disseminated HSV and she was placed on Valtrex. Since then patient notes that the rash has not improved and she thinks that her last month and maybe even got worse. Self-treated with: Nothing              Improvement with treatment: No  History Pruritis: yes  Tenderness: no but does admit to some burning New medications/antibiotics: yes, she was placed on doxycycline about 3 weeks ago although she notes the rash started before these antibiotics  Tick/insect/pet exposure: Possibly. Patient thinks that there may have been an insect on a lesion on her stomach and she went to the ED for this and was given doxycycline. Nose that she may have seen a Foley in her home. Denies seeing any bed bugs or having blood on her sheets.  Recent travel: no  New detergent, new clothing, or other topical exposure: no  Home situation: Patient notes that she recently had to get rid of many of the cats that were in her home. Patient is essentially a hoarder and having difficulty cleaning her home and notes that it smells of cat urine  Red Flags Feeling ill: no  Fever: no  Mouth lesions: no  Facial/tongue swelling/difficulty breathing:  no  Diabetic or immunocompromised: no    Review of Systems: Per HPI.   Past Medical History: Patient Active Problem List   Diagnosis Date Noted  . Facial rash 09/05/2016  . Rash and nonspecific skin eruption 09/05/2016  . 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 Ingrown toenail 08/16/2015  . Rash 08/06/2015   . Seborrheic dermatitis of scalp 01/30/2015  . Hyponatremia 01/30/2015  . Loss of appetite 01/30/2015  . Lip lesion 10/16/2014  . Hair loss 10/16/2014  . Lumbar pain 06/29/2014  . Trochanteric bursitis of right hip 06/21/2014  . Greater trochanteric bursitis of left hip 05/31/2014  . Healthcare maintenance 05/31/2014  . Axillary mass 05/31/2014  . 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 Right hip pain 10/10/2013  . Piriformis syndrome of right side 06/10/2013  . Dysuria 11/25/2012  . Syncopal episodes 04/06/2012  . Headache 12/10/2011  . Wrist pain, chronic 11/17/2011  . Thumb pain 08/06/2011  . Trochanteric bursitis 06/22/2011  . 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 Fatigue 03/26/2011  . Weight loss 03/26/2011  . Pelvic pain in female 12/17/2010  . Abdominal pain 12/17/2010  . Urinary incontinence 11/12/2010  . GENITAL HERPES 08/05/2010  . INSOMNIA 01/26/2009  . Pre-diabetes 12/15/2008  . DEPRESSION, MAJOR 05/17/2008  . ESSENTIAL HYPERTENSION, BENIGN 05/17/2008  . HYPERCHOLESTEROLEMIA 11/05/2006  . BIPOLAR DISORDER 11/05/2006    Medications: reviewed   Social Hx:  reports that she has never smoked. She has never used smokeless tobacco.   Objective:   BP 130/82   Pulse 85   Temp 98.4 F (36.9 C) (Oral)  Ht 5\' 2"  (1.575 m)   Wt 136 lb 6.4 oz (61.9 kg)   SpO2 99%   BMI 24.95 kg/m  Physical Exam  Gen: NAD, alert, cooperative with exam, Somewhat disheveled appearing HEENT: clear conjunctiva, oropharynx clear Skin: Erythematous~1-2 mm scattered lesions on face, chest, back, and legs. There are a few healing scabs throughout her body that are 1-2 mm in size. Some excoriations seen on posterior aspect of left lower leg. Neurological: no gross deficits. No drainage or underlying induration or fluctuance from any of these lesions. 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 Other differentials include insect bite from bedbugs or fleas, however, distribution would be abnormal for this. Less likely disseminated HSV based off of my exam, there are no vesicles or vesicular clusters seen. Patient also examined by Dr. Deirdre Priesthambliss. -Advised hydroxyzine when necessary although patient notes that she doesn't think this will help and will probably not take this medication.  -Discontinued hydrocortisone cream and instead will use triamcinolone cream twice a day on affected areas -Advised patient to try not to scratch the areas and instead wrote the areas when itchy -Advised to stop doxycycline -Return in 1 month    Meds ordered this encounter  Medications  . hydrOXYzine (ATARAX/VISTARIL) 10 MG tablet    Sig: Take 1 tablet (10 mg total) by mouth 3 (three) times daily as needed.    Dispense:  60 tablet    Refill:  2  . triamcinolone cream (KENALOG) 0.1 %    Sig: Apply 1 application topically 2 (two) times daily.    Dispense:  30 g    Refill:  0    Anders Simmondshristina Krikor Willet, MD [facility], PGY-2

## 2017-01-22 NOTE — Patient Instructions (Addendum)
Thank you for coming in today, it was so nice to see you! Today we talked about:    Rash: This is likely some inflammation of your skin. Please use Hydroxyzine as needed for itching and burning. Use Triamcinolone ointment twice daily on the itchy areas. If you have the desire to itch, try to avoid using your fingernails to itch and just rub the area  Please follow up in 1 month with your PCP. You can schedule this appointment at the front desk before you leave or call the clinic.  Bring in all your medications or supplements to each appointment for review.   If you have any questions or concerns, please do not hesitate to call the office at [phone]. You can also message me directly via MyChart.   Sincerely,  Janice Simmondshristina Rollin Kotowski, MD

## 2017-01-22 NOTE — Assessment & Plan Note (Addendum)
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 Other differentials include insect bite from bedbugs or fleas, however, distribution would be abnormal for this. Less likely disseminated HSV based off of my exam, there are no vesicles or vesicular clusters seen. Patient also examined by Dr. Deirdre Priesthambliss. -Advised hydroxyzine when necessary although patient notes that she doesn't think this will help and will probably not take this medication.  -Discontinued hydrocortisone cream and instead will use triamcinolone cream twice a day on affected areas -Advised patient to try not to scratch the areas and instead wrote the areas when itchy -Advised to stop doxycycline -Return in 1 month

## 2017-01-26 ENCOUNTER — Telehealth: Payer: Self-pay | Admitting: Internal Medicine

## 2017-01-26 NOTE — Telephone Encounter (Signed)
Will forward to PCP.  Sharri Loya L, RN  

## 2017-01-26 NOTE — Telephone Encounter (Signed)
Please let patient know as I have not evaluated patient I cannot comment on her skin condition. I would continue with Dr. Pennie RushingGambino's recommendations. She can schedule another appointment if she would does not think she is improving. Thanks! - AJL

## 2017-01-26 NOTE — Telephone Encounter (Signed)
Pt saw Dr. Jonathon JordanGambino last week and believed pt's skin issues was dermitis. Pt says it has turned out to spider mites. Pt wants to know what to do to get rid of them. Please advise. ep

## 2017-01-27 NOTE — Telephone Encounter (Signed)
Could not return patient's call yesterday as I was post call. Returned patient's call this today. She states that she removed "black organisms with legs"; 2 on her face and 1 on her arm and she is very concerned. Advised that patient should be seen at the clinic. She states she doesn't want to come in today. She stated that she could come into the clinic tomorrow maybe. Asked her to please call the front desk to see if there is a same day appt available. If not, advised her to go to urgent care.   Anders Simmondshristina Katelee Schupp, MD [facility], PGY-2

## 2017-01-27 NOTE — Telephone Encounter (Signed)
Pt is very upset that she has not heard back from Dr. Jonathon JordanGambino. Pt states she has one of the bugs in a bag and needs a different treatment, pt denied an appointment. Pt said to tell Dr. Jonathon JordanGambino if she is not treated properly today she will go to the ED. ep

## 2017-01-28 NOTE — Telephone Encounter (Signed)
Pre-visit call. Patient confirmed appointment for 01/29/17. Patient was advised to bring all current medications, and to arrive early for check-in. Patient has no transportation concerns at this time.

## 2017-01-29 ENCOUNTER — Encounter: Payer: Self-pay | Admitting: Family Medicine

## 2017-01-29 ENCOUNTER — Ambulatory Visit (INDEPENDENT_AMBULATORY_CARE_PROVIDER_SITE_OTHER): Payer: Medicare Other | Admitting: Family Medicine

## 2017-01-29 DIAGNOSIS — R21 Rash and other nonspecific skin eruption: Secondary | ICD-10-CM | POA: Diagnosis present

## 2017-01-29 MED ORDER — PERMETHRIN 5 % EX CREA: 1.0000 "application " | TOPICAL_CREAM | Freq: Once | CUTANEOUS | 0 refills | Status: AC

## 2017-01-29 NOTE — Progress Notes (Signed)
Subjective:    Patient ID: Janice Ramos , female   DOB: 1941/01/04 , 76 y.o..   MRN: [id number]  HPI  Janice Ramos is here for a same day visit for  Chief Complaint  Patient presents with  . Rash    1. Rash follow up: Patient was seen on May 17 for a diffuse rash. She was diagnosed with prurigo nodularis and was prescribed triamcinolone ointment for pruritus. No insects where seen at that time. She has been using the ointment as prescribed without any relief. Today patient presents with persistent pruritis. She states that she had to incidences where she pulled out a bug in her skin that was black with legs. Denies any fevers, chills, nausea, vomiting. Denies any sick contacts. Notes that she still thinks that maybe her partner gave her the rash because he has "poor hygiene".   Review of Systems: Per HPI. All other systems reviewed and are negative.  Past Medical History: Patient Active Problem List   Diagnosis Date Noted  . Facial rash 09/05/2016  . Rash and nonspecific skin eruption 09/05/2016  . 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 Ingrown toenail 08/16/2015  . Rash 08/06/2015  . Seborrheic dermatitis of scalp 01/30/2015  . Hyponatremia 01/30/2015  . Loss of appetite 01/30/2015  . Lip lesion 10/16/2014  . Hair loss 10/16/2014  . Lumbar pain 06/29/2014  . Trochanteric bursitis of right hip 06/21/2014  . Greater trochanteric bursitis of left hip 05/31/2014  . Healthcare maintenance 05/31/2014  . Axillary mass 05/31/2014  . 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 Right hip pain 10/10/2013  . Piriformis syndrome of right side 06/10/2013  . Dysuria 11/25/2012  . Syncopal episodes 04/06/2012  . Headache 12/10/2011  . Wrist pain, chronic 11/17/2011  . Thumb pain 08/06/2011  . Trochanteric bursitis 06/22/2011  . 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  Fatigue 03/26/2011  . Weight loss 03/26/2011  . Pelvic pain in female 12/17/2010  . Abdominal pain 12/17/2010  . Urinary incontinence 11/12/2010  . GENITAL HERPES 08/05/2010  . INSOMNIA 01/26/2009  . Pre-diabetes 12/15/2008  . DEPRESSION, MAJOR 05/17/2008  . ESSENTIAL HYPERTENSION, BENIGN 05/17/2008  . HYPERCHOLESTEROLEMIA 11/05/2006  . BIPOLAR DISORDER 11/05/2006    Medications: reviewed   Social Hx:  reports that she has never smoked. She has never used smokeless tobacco.   Objective:   BP 140/80   Pulse 88   Temp 98.8 F (37.1 C) (Oral)   Ht 5\' 2"  (1.575 m)   Wt 136 lb 9.6 oz (62 kg)   SpO2 97%   BMI 24.98 kg/m  Physical Exam  Gen: NAD, alert, cooperative with exam, well-appearing Psych: pressured speech, anxious Skin: Erythematous~1-2 mm scattered lesions on face, chest, back, and legs. There are a few healing scabs throughout her body that are 1-2 mm in size. Some excoriations seen on posterior aspect of left lower leg and new excoriations on left arm. No drainage or underlying induration or fluctuance from any of these lesions.           Assessment & Plan:  Rash and nonspecific skin eruption Rash unchanged from previous visit 1 week ago. Patient showed me a "bug" that she saved from her skin but looking at it looked like a scab that was picked off. This rash could still be prurigo nodularis vs bed bugs. Could also be atypical presentation of scabies. I  am also concerned that this rash is psychologic; she is picking at her skin because she thinks there are bugs inside. However, there are some location on her skin such as the middle of her back that would be harder to reach.  - Rx for per permethrin provided for her and her partner in case this is scabies - Discussed washing all clothes and linens in warm soapy water - Continue hydroxyzine PRN for itching - If rash does not show improvement, discussed with patient that we will need to refer to dermatology  Meds  ordered this encounter  Medications  . permethrin (ELIMITE) 5 % cream    Sig: Apply 1 application topically once.    Dispense:  120 g    Refill:  0    Please give 2 60 mg tubes    Anders Simmonds, MD [facility], PGY-2

## 2017-01-29 NOTE — Patient Instructions (Signed)
Scabies, Adult Scabies is a skin condition that happens when very small insects get under the skin (infestation). This causes a rash and severe itchiness. Scabies can spread from person to person (is contagious). If you get scabies, it is common for others in your household to get scabies too. With proper treatment, symptoms usually go away in 2-4 weeks. Scabies usually does not cause lasting problems. What are the causes? This condition is caused by mites (Sarcoptes scabiei, or human itch mites) that can only be seen with a microscope. The mites get into the top layer of skin and lay eggs. Scabies can spread from person to person through:  Close contact with a person who has scabies.  Contact with infested items, such as towels, bedding, or clothing. What increases the risk? This condition is more likely to develop in:  People who live in nursing homes and other extended-care facilities.  People who have sexual contact with a partner who has scabies.  Young children who attend child care facilities.  People who care for others who are at increased risk for scabies. What are the signs or symptoms? Symptoms of this condition may include:  Severe itchiness. This is often worse at night.  A rash that includes tiny red bumps or blisters. The rash commonly occurs on the wrist, elbow, armpit, fingers, waist, groin, or buttocks. Bumps may form a line (burrow) in some areas.  Skin irritation. This can include scaly patches or sores. How is this diagnosed? This condition is diagnosed with a physical exam. Your health care provider will look closely at your skin. In some cases, your health care provider may take a sample of your affected skin (skin scraping) and have it examined under a microscope. How is this treated? This condition may be treated with:  Medicated cream or lotion that kills the mites. This is spread on the entire body and left on for several hours. Usually, one treatment with  medicated cream or lotion is enough to kill all of the mites. In severe cases, the treatment may be repeated.  Medicated cream that relieves itching.  Medicines that help to relieve itching.  Medicines that kill the mites. This treatment is rarely used. Follow these instructions at home:   Medicines   Take or apply over-the-counter and prescription medicines as told by your health care provider.  Apply medicated cream or lotion as told by your health care provider.  Do not wash off the medicated cream or lotion until the necessary amount of time has passed. Skin Care   Avoid scratching your affected skin.  Keep your fingernails closely trimmed to reduce injury from scratching.  Take cool baths or apply cool washcloths to help reduce itching. General instructions   Clean all items that you recently had contact with, including bedding, clothing, and furniture. Do this on the same day that your treatment starts.  Use hot water when you wash items.  Place unwashable items into closed, airtight plastic bags for at least 3 days. The mites cannot live for more than 3 days away from human skin.  Vacuum furniture and mattresses that you use.  Make sure that other people who may have been infested are examined by a health care provider. These include members of your household and anyone who may have had contact with infested items.  Keep all follow-up visits as told by your health care provider. This is important. Contact a health care provider if:  You have itching that does not go away   after 4 weeks of treatment.  You continue to develop new bumps or burrows.  You have redness, swelling, or pain in your rash area after treatment.  You have fluid, blood, or pus coming from your rash. This information is not intended to replace advice given to you by your health care provider. Make sure you discuss any questions you have with your health care provider. Document Released:  05/16/2015 Document Revised: 01/31/2016 Document Reviewed: 03/27/2015 Elsevier Interactive Patient Education  2017 Elsevier Inc.  

## 2017-02-02 NOTE — Assessment & Plan Note (Signed)
Rash unchanged from previous visit 1 week ago. Patient showed me a "bug" that she saved from her skin but looking at it looked like a scab that was picked off. This rash could still be prurigo nodularis vs bed bugs. Could also be atypical presentation of scabies. I am also concerned that this rash is psychologic; she is picking at her skin because she thinks there are bugs inside. However, there are some location on her skin such as the middle of her back that would be harder to reach.  - Rx for per permethrin provided for her and her partner in case this is scabies - Discussed washing all clothes and linens in warm soapy water - Continue hydroxyzine PRN for itching - If rash does not show improvement, discussed with patient that we will need to refer to dermatology

## 2017-02-20 ENCOUNTER — Ambulatory Visit: Payer: Medicare Other | Admitting: Family Medicine

## 2017-02-20 NOTE — Progress Notes (Deleted)
Subjective:    Patient ID: Janice Ramos , female   DOB: 1940-11-04 , 76 y.o..   MRN: [id number]  HPI  Janice Ramos is here for No chief complaint on file.   1.  Rash follow up: Patient was seen on May 17 for a diffuse rash. She was diagnosed with prurigo nodularis and was prescribed triamcinolone ointment for pruritus. No insects where seen at that time. She has been using the ointment as prescribed without any relief. Today patient presents with persistent pruritis. She states that she had to incidences where she pulled out a bug in her skin that was black with legs. Denies any fevers, chills, nausea, vomiting. Denies any sick contacts. Notes that she still thinks that maybe her partner gave her the rash because he has "poor hygiene".   Review of Systems: Per HPI. All other systems reviewed and are negative.  Health Maintenance Due  Topic Date Due  . FOOT EXAM  12/26/1950  . OPHTHALMOLOGY EXAM  12/26/1950  . DEXA SCAN  12/26/2006  . PNA vac Low Risk Adult (1 of 2 - PCV13) 12/26/2006  . TETANUS/TDAP  02/06/2014  . MAMMOGRAM  04/07/2014  . HEMOGLOBIN A1C  07/06/2014    Past Medical History: Patient Active Problem List   Diagnosis Date Noted  . Facial rash 09/05/2016  . Rash and nonspecific skin eruption 09/05/2016  . 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 Ingrown toenail 08/16/2015  . Rash 08/06/2015  . Seborrheic dermatitis of scalp 01/30/2015  . Hyponatremia 01/30/2015  . Loss of appetite 01/30/2015  . Lip lesion 10/16/2014  . Hair loss 10/16/2014  . Lumbar pain 06/29/2014  . Trochanteric bursitis of right hip 06/21/2014  . Greater trochanteric bursitis of left hip 05/31/2014  . Healthcare maintenance 05/31/2014  . Axillary mass 05/31/2014  . 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 Right hip pain 10/10/2013  . Piriformis syndrome of right side 06/10/2013  . Dysuria 11/25/2012  . Syncopal episodes 04/06/2012  . Headache 12/10/2011  . Wrist pain, chronic  11/17/2011  . Thumb pain 08/06/2011  . Trochanteric bursitis 06/22/2011  . 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 Fatigue 03/26/2011  . Weight loss 03/26/2011  . Pelvic pain in female 12/17/2010  . Abdominal pain 12/17/2010  . Urinary incontinence 11/12/2010  . GENITAL HERPES 08/05/2010  . INSOMNIA 01/26/2009  . Pre-diabetes 12/15/2008  . DEPRESSION, MAJOR 05/17/2008  . ESSENTIAL HYPERTENSION, BENIGN 05/17/2008  . HYPERCHOLESTEROLEMIA 11/05/2006  . BIPOLAR DISORDER 11/05/2006    Medications: reviewed and updated Current Outpatient Prescriptions  Medication Sig Dispense Refill  . ACYCLOVIR PO Take 500 mg by mouth daily.    Marland Kitchen ALPRAZolam (XANAX) 0.5 MG tablet Take 0.5 mg by mouth at bedtime as needed. For insomnia/anxiety     . clobetasol ointment (TEMOVATE) 0.05 % Apply 1 application topically 2 (two) times daily as needed. 30 g 0  . doxycycline (VIBRAMYCIN) 100 MG capsule Take 1 capsule (100 mg total) by mouth 2 (two) times daily. 20 capsule 0  . fluocinonide-emollient (LIDEX-E) 0.05 % cream Apply 1 application topically 2 (two) times daily. 30 g 0  . hydrochlorothiazide (HYDRODIURIL) 25 MG tablet Take 1 tablet (25 mg total) by mouth daily. 90 tablet 3  . hydrOXYzine (ATARAX/VISTARIL) 10 MG tablet Take 1 tablet (10 mg total) by mouth 3 (three) times daily as needed. 60 tablet 2  . ibuprofen (ADVIL,MOTRIN) 800  MG tablet Take 1 tablet (800 mg total) by mouth every 8 (eight) hours as needed. 30 tablet 0  . lisinopril (PRINIVIL,ZESTRIL) 10 MG tablet Take 1 tablet (10 mg total) by mouth daily. 90 tablet 3  . methylphenidate (RITALIN) 20 MG tablet Take 20 mg by mouth 2 (two) times daily.  0  . naproxen sodium (ANAPROX) 220 MG tablet Take 220 mg by mouth as needed.    . predniSONE (DELTASONE) 50 MG tablet Take 1 tablet (50 mg total) by mouth daily with breakfast. 7 tablet 0  .  traZODone (DESYREL) 50 MG tablet Take 50 mg by mouth at bedtime. Per [facility]     . triamcinolone cream (KENALOG) 0.1 % Apply 1 application topically 2 (two) times daily. 30 g 0  . valACYclovir (VALTREX) 500 MG tablet Take 1 tablet (500 mg total) by mouth daily. 30 tablet 0  . venlafaxine (EFFEXOR-XR) 150 MG 24 hr capsule Take 300 mg by mouth daily. Per [facility] on [address]      No current facility-administered medications for this visit.     Social Hx:  reports that she has never smoked. She has never used smokeless tobacco.   Objective:   There were no vitals taken for this visit. Physical Exam  Gen: NAD, alert, cooperative with exam, well-appearing HEENT: NCAT, PERRL, clear conjunctiva, oropharynx clear, supple neck Cardiac: Regular rate and rhythm, normal S1/S2, no murmur, no edema, capillary refill brisk  Respiratory: Clear to auscultation bilaterally, no wheezes, non-labored breathing Gastrointestinal: soft, non tender, non distended, bowel sounds present Skin: no rashes, normal turgor  Neurological: no gross deficits.  Psych: good insight, normal mood and affect  Assessment & Plan:  No problem-specific Assessment & Plan notes found for this encounter.  No orders of the defined types were placed in this encounter.  No orders of the defined types were placed in this encounter.   Anders Simmondshristina Manhattan Mccuen, MD [facility], PGY-2

## 2017-03-26 ENCOUNTER — Encounter: Payer: Self-pay | Admitting: Family Medicine

## 2017-03-26 ENCOUNTER — Ambulatory Visit (INDEPENDENT_AMBULATORY_CARE_PROVIDER_SITE_OTHER): Payer: Medicare Other | Admitting: Family Medicine

## 2017-03-26 DIAGNOSIS — H5712 Ocular pain, left eye: Secondary | ICD-10-CM

## 2017-03-26 DIAGNOSIS — H5710 Ocular pain, unspecified eye: Secondary | ICD-10-CM | POA: Insufficient documentation

## 2017-03-26 NOTE — Patient Instructions (Signed)
Thank you for coming in today, it was so nice to see you! Today we talked about:    You had a lash in your eye. Dr. Leveda AnnaHensel removed it. We will place a referral for another eye specialist for you.     If you have any questions or concerns, please do not hesitate to call the office at [phone]. You can also message me directly via MyChart.   Sincerely,  Anders Simmondshristina Reena Borromeo, MD

## 2017-03-26 NOTE — Assessment & Plan Note (Signed)
Likely secondary to irritation from eyelash stuck in her conjunctiva on the left eye. On initial exam I did not see anything. Asked Dr. Leveda AnnaHensel to look at patient. Dr. Leveda AnnaHensel looked with a slit lamp and did observe one small eyelash and was able to remove it which provided instant relief for patient. -Follow-up as needed -Referral placed for ophthalmology per patient request

## 2017-03-26 NOTE — Progress Notes (Signed)
Subjective:    Patient ID: ARTIS BUECHELE , female   DOB: 10/07/1940 , 76 y.o..   MRN: [id number]  HPI  Janice Ramos is here for  Chief Complaint  Patient presents with  . Eye Problem    1. Left eye pain: Patient notes that she frequently gets eyelashes stuck in her eye. She comes in today with complaints of 3 eyelashes that are stopped and her conjunctiva. She notes that she was dismissed from her ophthalmologist otherwise she would have seen him. She has tried flushing the eye out with shower water with no relief. She even tried some erythromycin ointment that she had left over at home but this also did not work. Denies any change in her vision or eye discharge. Denies any change in color of her sclera.  Review of Systems: Per HPI.   Past Medical History: Patient Active Problem List   Diagnosis Date Noted  . Eye pain 03/26/2017  . Facial rash 09/05/2016  . Rash and nonspecific skin eruption 09/05/2016  . 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 Ingrown toenail 08/16/2015  . Rash 08/06/2015  . Seborrheic dermatitis of scalp 01/30/2015  . Hyponatremia 01/30/2015  . Loss of appetite 01/30/2015  . Lip lesion 10/16/2014  . Hair loss 10/16/2014  . Lumbar pain 06/29/2014  . Trochanteric bursitis of right hip 06/21/2014  . Greater trochanteric bursitis of left hip 05/31/2014  . Healthcare maintenance 05/31/2014  . Axillary mass 05/31/2014  . 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 Right hip pain 10/10/2013  . Piriformis syndrome of right side 06/10/2013  . Dysuria 11/25/2012  . Syncopal episodes 04/06/2012  . Headache 12/10/2011  . Wrist pain, chronic 11/17/2011  . Thumb pain 08/06/2011  . Trochanteric bursitis 06/22/2011  . 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 Fatigue 03/26/2011  . Weight loss 03/26/2011  . Pelvic pain in female 12/17/2010  . Abdominal  pain 12/17/2010  . Urinary incontinence 11/12/2010  . GENITAL HERPES 08/05/2010  . INSOMNIA 01/26/2009  . Pre-diabetes 12/15/2008  . DEPRESSION, MAJOR 05/17/2008  . ESSENTIAL HYPERTENSION, BENIGN 05/17/2008  . HYPERCHOLESTEROLEMIA 11/05/2006  . BIPOLAR DISORDER 11/05/2006    Medications: reviewed   Social Hx:  reports that she has never smoked. She has never used smokeless tobacco.   Objective:   BP (!) 158/88   Pulse 92   Temp 98 F (36.7 C) (Oral)   Ht 5\' 2"  (1.575 m)   Wt 138 lb 6.4 oz (62.8 kg)   SpO2 99%   BMI 25.31 kg/m  Physical Exam  Gen: NAD, alert, cooperative with exam, well-appearing HEENT:     Head: Normocephalic, atraumatic    Neck: No masses palpated. No goiter. No lymphadenopathy     Ears: External ears normal, no drainage.Tympanic membranes intact, normal light reflex bilaterally, no erythema or bulging    Eyes: PERRLA, EOMI, sclera white, normal conjunctiva, 1 eyelash hair stuck on inferior lateral part of conjunctiva of left eye    Nose: nasal turbinates moist, no nasal discharge    Throat: moist mucus membranes, no pharyngeal erythema, no tonsillar exudate. Airway is patent Psych: Anxious mood was congruent affect  Assessment & Plan:  Eye pain Likely secondary to irritation from eyelash stuck in her conjunctiva on the left eye. On initial exam I did not see anything. Asked Dr. Leveda Anna to look at patient. Dr. Leveda Anna looked with a slit lamp  and did observe one small eyelash and was able to remove it which provided instant relief for patient. -Follow-up as needed -Referral placed for ophthalmology per patient request  Orders Placed This Encounter  Procedures  . Ambulatory referral to Ophthalmology    Referral Priority:   Routine    Referral Type:   Consultation    Referral Reason:   Specialty Services Required    Requested Specialty:   Ophthalmology    Number of Visits Requested:   1    Anders Simmondshristina Aarik Blank, MD [facility], PGY-3

## 2017-04-23 ENCOUNTER — Ambulatory Visit: Payer: Medicare Other | Admitting: Internal Medicine

## 2017-06-24 ENCOUNTER — Other Ambulatory Visit: Payer: Self-pay | Admitting: Internal Medicine

## 2017-06-24 DIAGNOSIS — I10 Essential (primary) hypertension: Secondary | ICD-10-CM

## 2017-07-04 ENCOUNTER — Encounter (HOSPITAL_COMMUNITY): Payer: Self-pay | Admitting: Family Medicine

## 2017-07-04 ENCOUNTER — Ambulatory Visit (HOSPITAL_COMMUNITY)
Admission: EM | Admit: 2017-07-04 | Discharge: 2017-07-04 | Disposition: A | Discharge status: Home / Self Care | Payer: Medicare Other | Attending: Family Medicine | Admitting: Family Medicine

## 2017-07-04 DIAGNOSIS — H1033 Unspecified acute conjunctivitis, bilateral: Secondary | ICD-10-CM

## 2017-07-04 DIAGNOSIS — B009 Herpesviral infection, unspecified: Secondary | ICD-10-CM | POA: Diagnosis not present

## 2017-07-04 MED ORDER — TOBRAMYCIN 0.3 % OP SOLN: 1.0000 [drp] | Freq: Four times a day (QID) | OPHTHALMIC | 0 refills | Status: DC

## 2017-07-04 MED ORDER — VALACYCLOVIR HCL 500 MG PO TABS: 500.0000 mg | ORAL_TABLET | Freq: Two times a day (BID) | ORAL | 10 refills | Status: DC

## 2017-07-04 NOTE — Discharge Instructions (Signed)
Return is symptoms persist or see an eye doctor

## 2017-07-04 NOTE — ED Provider Notes (Signed)
[facility]   [id number] 07/04/17 Arrival Time: 1858   SUBJECTIVE:  Janice Ramos is a 76 y.o. female who presents to the urgent care with complaint of eye irritation bilaterally for 4 weeks.  Her eye doctor discharged her from his practice.  She also has a herpes outbreak for several days and would like a refill on herpes medicine.     Past Medical History:  Diagnosis Date  . Allergy   . Bipolar 1 disorder (HCC)   . Depression   . Diabetes mellitus   . Genital herpes in women 1983  . Hyperlipidemia   . Irritable bowel syndrome (IBS)   . Overdose of benzodiazepine 06/2005   Xanax  . Sinus disorder    Family History  Problem Relation Age of Onset  . Cancer Mother        Bone  . Suicidality Father    Social History   Social History  . Marital status: Divorced    Spouse name: N/A  . Number of children: 2  . Years of education: N/A   Occupational History  . Retired     Tree surgeon   Social History Main Topics  . Smoking status: Never Smoker  . Smokeless tobacco: Never Used  . Alcohol use 0.0 oz/week     Comment: 1 x per year  . Drug use: No  . Sexual activity: Not on file   Other Topics Concern  . Not on file   Social History Narrative   Occassional EtOH.  Never smoked.  Denies drugs.     OD's on xanax in 2006.  Has 2 cats.  Is an Tree surgeon.     Meredeth Ide and stretches for exercises.     Single - divorced 12 years ago.   severe psychiatric impairments      Health Care POA:    Emergency Contact: daughter, Roda Shutters, [phone]   End of Life Plan:    Who lives with you: self   Any pets: 2 cats   Diet: Patient focuses on eating vegetables and states she might have a gluten intolerance.   Exercise: Patient reports moving all day long   Seatbelts: Patient reports wearing seatbelt when in vehicle.   Wynelle Link Exposure/Protection: Patient reports wearing sun screen daily.    Hobbies: Art-pastels, biking, cars, reading         Current Meds    Medication Sig  . ALPRAZolam (XANAX) 0.5 MG tablet Take 0.5 mg by mouth at bedtime as needed. For insomnia/anxiety   . hydrochlorothiazide (HYDRODIURIL) 25 MG tablet TAKE 1 TABLET BY MOUTH EVERY DAY  . lisinopril (PRINIVIL,ZESTRIL) 10 MG tablet TAKE 1 TABLET BY MOUTH EVERY DAY  . methylphenidate (RITALIN) 20 MG tablet Take 20 mg by mouth 2 (two) times daily.  . [DISCONTINUED] ACYCLOVIR PO Take 500 mg by mouth daily.  . [DISCONTINUED] clobetasol ointment (TEMOVATE) 0.05 % Apply 1 application topically 2 (two) times daily as needed.   No Known Allergies    ROS: As per HPI, remainder of ROS negative.   OBJECTIVE:   Vitals:   07/04/17 1956  BP: (!) 181/94  Pulse: 99  Resp: 20  Temp: 98.7 F (37.1 C)  TempSrc: Oral  SpO2: 98%     General appearance: alert; no distress Eyes: PERRL; EOMI; conjunctiva reddened HENT: normocephalic; atraumatic; TMs normal, canal normal, external ears normal without trauma; nasal mucosa normal; oral mucosa normal Neck: supple Back: no CVA tenderness Extremities: no cyanosis or edema; symmetrical with no gross deformities  Skin: warm and dry with multiple facial excoriations Neurologic: normal gait; grossly normal Psychological: alert and cooperative; normal mood and affect      Labs:  Results for orders placed or performed in visit on 12/12/15  Comprehensive metabolic panel  Result Value Ref Range   Sodium 133 (L) 135 - 146 mmol/L   Potassium 4.0 3.5 - 5.3 mmol/L   Chloride 96 (L) 98 - 110 mmol/L   CO2 30 20 - 31 mmol/L   Glucose, Bld 84 65 - 99 mg/dL   BUN 18 7 - 25 mg/dL   Creat 1.610.73 0.960.60 - 0.450.93 mg/dL   Total Bilirubin 0.4 0.2 - 1.2 mg/dL   Alkaline Phosphatase 64 33 - 130 U/L   AST 16 10 - 35 U/L   ALT 17 6 - 29 U/L   Total Protein 7.1 6.1 - 8.1 g/dL   Albumin 4.3 3.6 - 5.1 g/dL   Calcium 9.4 8.6 - 40.910.4 mg/dL    Labs Reviewed - No data to display  No results found.     ASSESSMENT & PLAN:  1. Acute conjunctivitis of  both eyes, unspecified acute conjunctivitis type   2. Herpes simplex     Meds ordered this encounter  Medications  . valACYclovir (VALTREX) 500 MG tablet    Sig: Take 1 tablet (500 mg total) by mouth 2 (two) times daily.    Dispense:  6 tablet    Refill:  10  . tobramycin (TOBREX) 0.3 % ophthalmic solution    Sig: Place 1 drop into both eyes every 6 (six) hours.    Dispense:  5 mL    Refill:  0    Reviewed expectations re: course of current medical issues. Questions answered. Outlined signs and symptoms indicating need for more acute intervention. Patient verbalized understanding. After Visit Summary given.      Elvina SidleLauenstein, Melanie Pellot, MD 07/04/17 2000

## 2017-07-04 NOTE — ED Triage Notes (Signed)
Pt here for bilateral pink eye onset 4 weeks associated w/rash around eyes  Sts it itches and has pain  Needing refill on her acyclovir as well  A&O x4... NAD... Ambulatory

## 2017-07-06 ENCOUNTER — Ambulatory Visit (HOSPITAL_COMMUNITY)
Admission: EM | Admit: 2017-07-06 | Discharge: 2017-07-06 | Disposition: A | Discharge status: Home / Self Care | Payer: Medicare Other

## 2017-07-09 ENCOUNTER — Ambulatory Visit: Payer: Medicare Other | Admitting: Internal Medicine

## 2017-07-27 ENCOUNTER — Ambulatory Visit (HOSPITAL_COMMUNITY)
Admission: EM | Admit: 2017-07-27 | Discharge: 2017-07-27 | Disposition: A | Discharge status: Home / Self Care | Payer: Medicare Other | Attending: Emergency Medicine | Admitting: Emergency Medicine

## 2017-07-27 ENCOUNTER — Encounter (HOSPITAL_COMMUNITY): Payer: Self-pay | Admitting: Emergency Medicine

## 2017-07-27 DIAGNOSIS — B0231 Zoster conjunctivitis: Secondary | ICD-10-CM

## 2017-07-27 DIAGNOSIS — S0502XA Injury of conjunctiva and corneal abrasion without foreign body, left eye, initial encounter: Secondary | ICD-10-CM | POA: Diagnosis not present

## 2017-07-27 MED ORDER — HYDROCODONE-ACETAMINOPHEN 5-325 MG PO TABS: 1.0000 | ORAL_TABLET | ORAL | 0 refills | Status: DC | PRN

## 2017-07-27 MED ORDER — ERYTHROMYCIN 5 MG/GM OP OINT: TOPICAL_OINTMENT | OPHTHALMIC | 0 refills | Status: DC

## 2017-07-27 MED ORDER — ACYCLOVIR 400 MG PO TABS: 400.0000 mg | ORAL_TABLET | Freq: Every day | ORAL | 0 refills | Status: DC

## 2017-07-27 NOTE — Discharge Instructions (Signed)
Use the ointment and medication as directed. Start taking the acyclovir rather than her other antiviral medicine. Take the pain medicine as needed. This can cause sleepiness, drowsiness and falls. Recommend not driving while taking it. You need to see the ophthalmologist as soon as possible, he will see tomorrow morning. If you cannot make it call his office and schedule for an appointment as soon as possible.

## 2017-07-27 NOTE — ED Triage Notes (Signed)
Pt. Stated, Im having eye pain , I have something in there and I also have herpes

## 2017-07-27 NOTE — ED Provider Notes (Signed)
[facility]    CSN: [id number] Arrival date & time: 07/27/17  1824     History   Chief Complaint Chief Complaint  Patient presents with  . Eye Pain    HPI Janice Ramos is a 75 y.o. female.   76 year old female presents to the urgent care with left eye pain. She has a history of facial and ophthalmic herpes zoster involvement for several years. She states this occurs abruptly 2 times per year. She is complaining of left eye pain and itching and she has been rubbing and scratching it for most of the day. She thinks she may have scratched it and has a foreign body sensation. Denies any significant visual changes. She has been taking valacyclovir 500 mg for 5 days. There appears to be involvement of zoster lesions to the left forehead and face.      Past Medical History:  Diagnosis Date  . Allergy   . Bipolar 1 disorder (HCC)   . Depression   . Diabetes mellitus   . Genital herpes in women 1983  . Hyperlipidemia   . Irritable bowel syndrome (IBS)   . Overdose of benzodiazepine 06/2005   Xanax  . Sinus disorder     Patient Active Problem List   Diagnosis Date Noted  . Eye pain 03/26/2017  . Facial rash 09/05/2016  . Rash and nonspecific skin eruption 09/05/2016  . 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 Ingrown toenail 08/16/2015  . Rash 08/06/2015  . Seborrheic dermatitis of scalp 01/30/2015  . Hyponatremia 01/30/2015  . Loss of appetite 01/30/2015  . Lip lesion 10/16/2014  . Hair loss 10/16/2014  . Lumbar pain 06/29/2014  . Trochanteric bursitis of right hip 06/21/2014  . Greater trochanteric bursitis of left hip 05/31/2014  . Healthcare maintenance 05/31/2014  . Axillary mass 05/31/2014  . 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 Right hip pain 10/10/2013  . Piriformis syndrome of right side 06/10/2013  . Dysuria 11/25/2012  . Syncopal episodes 04/06/2012  . Headache 12/10/2011  . Wrist pain, chronic 11/17/2011  . Thumb pain 08/06/2011    . Trochanteric bursitis 06/22/2011  . 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 Fatigue 03/26/2011  . Weight loss 03/26/2011  . Pelvic pain in female 12/17/2010  . Abdominal pain 12/17/2010  . Urinary incontinence 11/12/2010  . GENITAL HERPES 08/05/2010  . INSOMNIA 01/26/2009  . Pre-diabetes 12/15/2008  . DEPRESSION, MAJOR 05/17/2008  . ESSENTIAL HYPERTENSION, BENIGN 05/17/2008  . HYPERCHOLESTEROLEMIA 11/05/2006  . BIPOLAR DISORDER 11/05/2006    Past Surgical History:  Procedure Laterality Date  . CATARACT EXTRACTION    . Cataract Surgery     L eye 12/2008, R eye 05/2009 [facility]  . EYE SURGERY    . TUBAL LIGATION      OB History    No data available       Home Medications    Prior to Admission medications   Medication Sig Start Date End Date Taking? Authorizing Provider  acyclovir (ZOVIRAX) 400 MG tablet Take 1 tablet (400 mg total) 5 (five) times daily by mouth. 07/27/17   Wake Conlee, Onalee Hua, NP  ALPRAZolam Prudy Feeler) 0.5 MG tablet Take 0.5 mg by mouth at bedtime as needed. For insomnia/anxiety     [provider]  erythromycin ophthalmic ointment Place a 1/2 inch ribbon of ointment into the lower eyelid. 07/27/17   Hayden Rasmussen, NP  hydrochlorothiazide (HYDRODIURIL)  25 MG tablet TAKE 1 TABLET BY MOUTH EVERY DAY 06/24/17   Marquette SaaLancaster, Abigail Joseph, MD  HYDROcodone-acetaminophen (NORCO/VICODIN) 5-325 MG tablet Take 1 tablet every 4 (four) hours as needed by mouth. 07/27/17   Hayden RasmussenMabe, Bryah [place]che[place]ree, NP  hydrOXYzine (ATARAX/VISTARIL) 10 MG tablet Take 1 tablet (10 mg total) by mouth 3 (three) times daily as needed. 01/22/17   Beaulah DinningGambino, Christina M, MD  lisinopril (PRINIVIL,ZESTRIL) 10 MG tablet TAKE 1 TABLET BY MOUTH EVERY DAY 06/24/17   Marquette SaaLancaster, Abigail Joseph, MD  methylphenidate (RITALIN) 20 MG tablet Take 20 mg by mouth 2 (two) times daily. 11/09/15   [provider]   tobramycin (TOBREX) 0.3 % ophthalmic solution Place 1 drop into both eyes every 6 (six) hours. 07/04/17   Elvina SidleLauenstein, Kurt, MD  traZODone (DESYREL) 50 MG tablet Take 50 mg by mouth at bedtime. Per [facility]     [provider]  valACYclovir (VALTREX) 500 MG tablet Take 1 tablet (500 mg total) by mouth 2 (two) times daily. 07/04/17   Elvina SidleLauenstein, Kurt, MD  venlafaxine (EFFEXOR-XR) 150 MG 24 hr capsule Take 300 mg by mouth daily. Per [facility] on [address]     [provider]    Family History Family History  Problem Relation Age of Onset  . Cancer Mother        Bone  . Suicidality Father     Social History Social History   Tobacco Use  . Smoking status: Never Smoker  . Smokeless tobacco: Never Used  Substance Use Topics  . Alcohol use: Yes    Alcohol/week: 0.0 oz    Comment: 1 x per year  . Drug use: No     Allergies   Patient has no known allergies.   Review of Systems Review of Systems  Constitutional: Positive for activity change. Negative for fever.  Eyes: Positive for pain and redness. Negative for photophobia.  Respiratory: Negative.   Gastrointestinal: Negative.   Skin: Positive for rash.  Neurological: Negative.   All other systems reviewed and are negative.    Physical Exam Triage Vital Signs ED Triage Vitals  Enc Vitals Group     BP 07/27/17 1845 (!) 178/80     Pulse Rate 07/27/17 1845 92     Resp 07/27/17 1845 19     Temp 07/27/17 1845 98.2 F (36.8 C)     Temp Source 07/27/17 1845 Oral     SpO2 07/27/17 1845 100 %     Weight 07/27/17 1845 134 lb (60.8 kg)     Height 07/27/17 1845 5' 2.5" (1.588 m)     Head Circumference --      Peak Flow --      Pain Score 07/27/17 1846 10     Pain Loc --      Pain Edu? --      Excl. in GC? --    No data found.  Updated Vital Signs BP (!) 178/80 (BP Location: Right Arm)   Pulse 92   Temp 98.2 F (36.8 C) (Oral)   Resp 19   Ht 5' 2.5" (1.588 m)   Wt 134  lb (60.8 kg)   SpO2 100%   BMI 24.12 kg/m   Visual Acuity Right Eye Distance:   Left Eye Distance:   Bilateral Distance:    Right Eye Near:   Left Eye Near:    Bilateral Near:     Physical Exam  Constitutional: She is oriented to person, place, and time. She  appears well-developed and well-nourished. No distress.  HENT:  Mouth/Throat: Oropharynx is clear and moist.  Red facial lesions roughly rough and papular. Mainly isolated papular lesions to the forehead and left anterior face. There is erythema surrounding the left eye involving the upper and lower lids.  Eyes: EOM are normal.  Left eye with upper and lower lid erythema with slight scaling of the epidermis. Positive for tenderness. There is erythema to the sclera and conjunctiva. With the naked eye the anterior chamber looks clear however after anesthetic drops and stained there appears to be to wide abrasions across the anterior cornea. No specific dendritic lesions are seen but the corneal abrasions block good visual examination within the anterior chamber. The eye has been irrigated. The patient states he feels better after tetracaine and irrigation.  Neck: Normal range of motion. Neck supple.  Pulmonary/Chest: Effort normal.  Neurological: She is alert and oriented to person, place, and time.  Psychiatric: She has a normal mood and affect.  Nursing note and vitals reviewed.    UC Treatments / Results  Labs (all labs ordered are listed, but only abnormal results are displayed) Labs Reviewed - No data to display  EKG  EKG Interpretation None       Radiology No results found.  Procedures Procedures (including critical care time)  Medications Ordered in UC Medications - No data to display   Initial Impression / Assessment and Plan / UC Course  I have reviewed the triage vital signs and the nursing notes.  Pertinent labs & imaging results that were available during my care of the patient were reviewed by me  and considered in my medical decision making (see chart for details).    Use the ointment and medication as directed. Start taking the acyclovir rather than her other antiviral medicine. Take the pain medicine as needed. This can cause sleepiness, drowsiness and falls. Recommend not driving while taking it. You need to see the ophthalmologist as soon as possible, he will see tomorrow morning. If you cannot make it call his office and schedule for an appointment as soon as possible.  Spoke with Dr. Christell ConstantMoore case at 2010 PM. He wanted to see her in the office tomorrow morning. Prescribe acyclovir 400 mg 5 times a day and start erythromycin ointment. Patient states he is familiar with the office and will call. She states she has an appointment with a therapist tomorrow morning and may not be able to make it. The importance of seeing the ophthalmologist was explained to the patient that she may have eye pain and problems with her vision if she does not have immediate treatment.   Final Clinical Impressions(s) / UC Diagnoses   Final diagnoses:  Abrasion of left cornea, initial encounter  Herpes zoster conjunctivitis of left eye    ED Discharge Orders        Ordered    HYDROcodone-acetaminophen (NORCO/VICODIN) 5-325 MG tablet  Every 4 hours PRN     07/27/17 2010    acyclovir (ZOVIRAX) 400 MG tablet  5 times daily     07/27/17 2010    erythromycin ophthalmic ointment     07/27/17 2010       Controlled Substance Prescriptions [place] Controlled Substance Registry consulted? No   Hayden RasmussenMabe, Hansford Hirt, NP 07/27/17 2021

## 2017-08-19 ENCOUNTER — Encounter (HOSPITAL_COMMUNITY): Payer: Self-pay | Admitting: Emergency Medicine

## 2017-08-19 ENCOUNTER — Emergency Department (HOSPITAL_COMMUNITY)
Admission: EM | Admit: 2017-08-19 | Discharge: 2017-08-20 | Disposition: A | Discharge status: Home / Self Care | Payer: Medicare Other | Attending: Emergency Medicine | Admitting: Emergency Medicine

## 2017-08-19 DIAGNOSIS — T50902A Poisoning by unspecified drugs, medicaments and biological substances, intentional self-harm, initial encounter: Secondary | ICD-10-CM | POA: Insufficient documentation

## 2017-08-19 DIAGNOSIS — Z79899 Other long term (current) drug therapy: Secondary | ICD-10-CM | POA: Insufficient documentation

## 2017-08-19 DIAGNOSIS — F10920 Alcohol use, unspecified with intoxication, uncomplicated: Secondary | ICD-10-CM | POA: Diagnosis not present

## 2017-08-19 DIAGNOSIS — E119 Type 2 diabetes mellitus without complications: Secondary | ICD-10-CM | POA: Diagnosis not present

## 2017-08-19 DIAGNOSIS — F1014 Alcohol abuse with alcohol-induced mood disorder: Secondary | ICD-10-CM | POA: Diagnosis not present

## 2017-08-19 DIAGNOSIS — R45851 Suicidal ideations: Secondary | ICD-10-CM | POA: Diagnosis not present

## 2017-08-19 LAB — COMPREHENSIVE METABOLIC PANEL
ALBUMIN: 4.1 g/dL (ref 3.5–5.0)
ALT: 21 U/L (ref 14–54)
AST: 26 U/L (ref 15–41)
Alkaline Phosphatase: 80 U/L (ref 38–126)
Anion gap: 11 (ref 5–15)
BILIRUBIN TOTAL: 0.2 mg/dL — AB (ref 0.3–1.2)
BUN: 21 mg/dL — AB (ref 6–20)
CALCIUM: 9.4 mg/dL (ref 8.9–10.3)
CO2: 26 mmol/L (ref 22–32)
Chloride: 99 mmol/L — ABNORMAL LOW (ref 101–111)
Creatinine, Ser: 0.68 mg/dL (ref 0.44–1.00)
GFR calc Af Amer: >60 mL/min (ref >60)
GFR calc non Af Amer: >60 mL/min (ref >60)
GLUCOSE: 97 mg/dL (ref 65–99)
POTASSIUM: 2.8 mmol/L — AB (ref 3.5–5.1)
SODIUM: 136 mmol/L (ref 135–145)
TOTAL PROTEIN: 7.7 g/dL (ref 6.5–8.1)

## 2017-08-19 LAB — ETHANOL: ALCOHOL ETHYL (B): 196 mg/dL — AB (ref <10)

## 2017-08-19 LAB — CBC WITH DIFFERENTIAL/PLATELET
BASOS ABS: 0.1 10*3/uL (ref 0.0–0.1)
BASOS PCT: 1 %
Eosinophils Absolute: 0.2 10*3/uL (ref 0.0–0.7)
Eosinophils Relative: 2 %
HEMATOCRIT: 41 % (ref 36.0–46.0)
HEMOGLOBIN: 13.9 g/dL (ref 12.0–15.0)
Lymphocytes Relative: 35 %
Lymphs Abs: 3.2 10*3/uL (ref 0.7–4.0)
MCH: 32.5 pg (ref 26.0–34.0)
MCHC: 33.9 g/dL (ref 30.0–36.0)
MCV: 95.8 fL (ref 78.0–100.0)
MONO ABS: 0.6 10*3/uL (ref 0.1–1.0)
Monocytes Relative: 6 %
NEUTROS ABS: 5.1 10*3/uL (ref 1.7–7.7)
Neutrophils Relative %: 56 %
Platelets: 343 10*3/uL (ref 150–400)
RBC: 4.28 MIL/uL (ref 3.87–5.11)
RDW: 13 % (ref 11.5–15.5)
WBC: 9.1 10*3/uL (ref 4.0–10.5)

## 2017-08-19 LAB — ACETAMINOPHEN LEVEL: Acetaminophen (Tylenol), Serum: <10 ug/mL — ABNORMAL LOW (ref 10–30)

## 2017-08-19 LAB — SALICYLATE LEVEL: Salicylate Lvl: <7 mg/dL (ref 2.8–30.0)

## 2017-08-19 MED ORDER — POTASSIUM CHLORIDE 10 MEQ/100ML IV SOLN
10.0000 meq | INTRAVENOUS | Status: AC
  Administered 2017-08-19 – 2017-08-20 (×2): 10 meq via INTRAVENOUS
  Filled 2017-08-19 (×2): qty 100

## 2017-08-19 MED ORDER — POTASSIUM CHLORIDE CRYS ER 20 MEQ PO TBCR
40.0000 meq | EXTENDED_RELEASE_TABLET | Freq: Once | ORAL | Status: AC
  Administered 2017-08-19: 40 meq via ORAL
  Filled 2017-08-19: qty 2

## 2017-08-19 MED ORDER — ONDANSETRON HCL 4 MG/2ML IJ SOLN
4.0000 mg | Freq: Once | INTRAMUSCULAR | Status: AC
  Administered 2017-08-19: 4 mg via INTRAVENOUS
  Filled 2017-08-19: qty 2

## 2017-08-19 MED ORDER — LISINOPRIL 10 MG PO TABS: 10.0000 mg | ORAL_TABLET | Freq: Every day | ORAL | Status: DC

## 2017-08-19 MED ORDER — LISINOPRIL 10 MG PO TABS
10.0000 mg | ORAL_TABLET | Freq: Every day | ORAL | Status: DC
  Administered 2017-08-20: 10 mg via ORAL
  Filled 2017-08-19: qty 1

## 2017-08-19 MED ORDER — SODIUM CHLORIDE 0.9 % IV BOLUS (SEPSIS)
1000.0000 mL | Freq: Once | INTRAVENOUS | Status: AC
  Administered 2017-08-19: 1000 mL via INTRAVENOUS

## 2017-08-19 MED ORDER — HYDROCHLOROTHIAZIDE 25 MG PO TABS
25.0000 mg | ORAL_TABLET | Freq: Every day | ORAL | Status: DC
  Administered 2017-08-20: 25 mg via ORAL
  Filled 2017-08-19: qty 1

## 2017-08-19 NOTE — ED Notes (Signed)
Bed: RESB Expected date:  Expected time:  Means of arrival:  Comments: EMS Overdose 

## 2017-08-19 NOTE — ED Provider Notes (Signed)
[facility]-EMERGENCY DEPT Provider Note   CSN: [id number] Arrival date & time: 08/19/17  2141     History   Chief Complaint Chief Complaint  Patient presents with  . Alcohol Intoxication  . Drug Overdose    HPI Janice Ramos is a 76 y.o. female.  The history is provided by the patient and medical records. No language interpreter was used.  Alcohol Intoxication   Drug Overdose    Janice Ramos is a 76 y.o. female  with a PMH of DM, HLD, bipolar disorder who presents to the Emergency Department for evaluation following intentional drug overdose.  Patient states that she has had a lot going on over the last year and just "wanted to end it all".  She states that she drank a full bottle of eggnog from a liquor store and started to think about the condition of her life.  She states that her cat has destroyed her home and making it uninhabitable.  She states the cat has urinated all over the house and she has not been able to clean it all up.  She is very upset by this states that she started to think maybe she should no longer live.  She then took 3 of her prescription Xanax along with 3 of her prescription mirtazapine around 830 tonight.  Patient does endorse that she was taking medications in an attempt to harm herself, but now states that she does not feel suicidal.  She states that she was just mad about her life and felt depressed, but she now realizes that this was wrong.  Denies any homicidal ideations, auditory or visual hallucinations.  She states that she feels sleepy, but otherwise feels fine.   Past Medical History:  Diagnosis Date  . Allergy   . Bipolar 1 disorder (HCC)   . Depression   . Diabetes mellitus   . Genital herpes in women 1983  . Hyperlipidemia   . Irritable bowel syndrome (IBS)   . Overdose of benzodiazepine 06/2005   Xanax  . Sinus disorder     Patient Active Problem List   Diagnosis Date Noted  . Eye pain 03/26/2017  .  Facial rash 09/05/2016  . Rash and nonspecific skin eruption 09/05/2016  . 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 Ingrown toenail 08/16/2015  . Rash 08/06/2015  . Seborrheic dermatitis of scalp 01/30/2015  . Hyponatremia 01/30/2015  . Loss of appetite 01/30/2015  . Lip lesion 10/16/2014  . Hair loss 10/16/2014  . Lumbar pain 06/29/2014  . Trochanteric bursitis of right hip 06/21/2014  . Greater trochanteric bursitis of left hip 05/31/2014  . Healthcare maintenance 05/31/2014  . Axillary mass 05/31/2014  . 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 Right hip pain 10/10/2013  . Piriformis syndrome of right side 06/10/2013  . Dysuria 11/25/2012  . Syncopal episodes 04/06/2012  . Headache 12/10/2011  . Wrist pain, chronic 11/17/2011  . Thumb pain 08/06/2011  . Trochanteric bursitis 06/22/2011  . 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 Fatigue 03/26/2011  . Weight loss 03/26/2011  . Pelvic pain in female 12/17/2010  . Abdominal pain 12/17/2010  . Urinary incontinence 11/12/2010  . GENITAL HERPES 08/05/2010  . INSOMNIA 01/26/2009  . Pre-diabetes 12/15/2008  . DEPRESSION, MAJOR 05/17/2008  . ESSENTIAL HYPERTENSION, BENIGN 05/17/2008  . HYPERCHOLESTEROLEMIA 11/05/2006  . BIPOLAR DISORDER 11/05/2006    Past Surgical History:  Procedure  Laterality Date  . CATARACT EXTRACTION    . Cataract Surgery     L eye 12/2008, R eye 05/2009 [facility]  . EYE SURGERY    . TUBAL LIGATION      OB History    No data available       Home Medications    Prior to Admission medications   Medication Sig Start Date End Date Taking? Authorizing Provider  acyclovir (ZOVIRAX) 400 MG tablet Take 1 tablet (400 mg total) 5 (five) times daily by mouth. 07/27/17  Yes Mabe, Onalee Huaavid, NP  ALPRAZolam Prudy Feeler(XANAX) 0.5 MG tablet Take 0.5 mg by mouth at bedtime as needed. For insomnia/anxiety    Yes  [provider]  erythromycin ophthalmic ointment Place a 1/2 inch ribbon of ointment into the lower eyelid. 07/27/17  Yes Mabe, Onalee Huaavid, NP  hydrochlorothiazide (HYDRODIURIL) 25 MG tablet TAKE 1 TABLET BY MOUTH EVERY DAY 06/24/17  Yes Marquette SaaLancaster, Abigail Joseph, MD  lisinopril (PRINIVIL,ZESTRIL) 10 MG tablet TAKE 1 TABLET BY MOUTH EVERY DAY 06/24/17  Yes Marquette SaaLancaster, Abigail Joseph, MD  methylphenidate (METADATE ER) 20 MG ER tablet Take by mouth 2 (two) times daily. 07/15/17  Yes [provider]  mirtazapine (REMERON) 30 MG tablet TAKE 1 TABLET BY MOUTH EVERYDAY AT BEDTIME 07/27/17  Yes [provider]  tobramycin (TOBREX) 0.3 % ophthalmic solution Place 1 drop into both eyes every 6 (six) hours. 07/04/17  Yes Elvina SidleLauenstein, Kurt, MD  valACYclovir (VALTREX) 1000 MG tablet Take 500 mg by mouth 2 (two) times daily. 07/08/17  Yes [provider]  valACYclovir (VALTREX) 500 MG tablet Take 1 tablet (500 mg total) by mouth 2 (two) times daily. 07/04/17  Yes Elvina SidleLauenstein, Kurt, MD  HYDROcodone-acetaminophen (NORCO/VICODIN) 5-325 MG tablet Take 1 tablet every 4 (four) hours as needed by mouth. Patient taking differently: Take 1 tablet by mouth every 4 (four) hours as needed for moderate pain or severe pain.  07/27/17   Hayden RasmussenMabe, David, NP  hydrOXYzine (ATARAX/VISTARIL) 10 MG tablet Take 1 tablet (10 mg total) by mouth 3 (three) times daily as needed. Patient taking differently: Take 10 mg by mouth 3 (three) times daily as needed for anxiety.  01/22/17   Beaulah DinningGambino, Christina M, MD  methylphenidate (RITALIN) 20 MG tablet Take 20 mg by mouth 2 (two) times daily. 11/09/15   [provider]  traZODone (DESYREL) 50 MG tablet Take 50 mg by mouth at bedtime. Per [facility]     [provider]  venlafaxine (EFFEXOR-XR) 150 MG 24 hr capsule Take 300 mg by mouth daily. Per [facility] on [address]     [provider]  zolpidem (AMBIEN CR) 12.5 MG CR  tablet Take 12.5 mg by mouth at bedtime. 07/14/17   [provider]    Family History Family History  Problem Relation Age of Onset  . Cancer Mother        Bone  . Suicidality Father     Social History Social History   Tobacco Use  . Smoking status: Never Smoker  . Smokeless tobacco: Never Used  Substance Use Topics  . Alcohol use: Yes    Alcohol/week: 0.0 oz    Comment: 1 x per year  . Drug use: No     Allergies   Patient has no known allergies.   Review of Systems Review of Systems  Psychiatric/Behavioral: Positive for suicidal ideas.  All other systems reviewed and are negative.    Physical Exam Updated Vital Signs BP 122/69 (  BP Location: Right Arm)   Pulse 74   Resp (!) 21   SpO2 95%   Physical Exam  Constitutional: She is oriented to person, place, and time. She appears well-developed and well-nourished. No distress.  HENT:  Head: Normocephalic and atraumatic.  Neck: Neck supple.  Cardiovascular: Normal rate, regular rhythm and normal heart sounds.  No murmur heard. Pulmonary/Chest: Effort normal and breath sounds normal. No respiratory distress.  Abdominal: Soft. She exhibits no distension. There is no tenderness.  Neurological: She is alert and oriented to person, place, and time.  Skin: Skin is warm and dry.  Psychiatric:  Tearful.   Nursing note and vitals reviewed.    ED Treatments / Results  Labs (all labs ordered are listed, but only abnormal results are displayed) Labs Reviewed  COMPREHENSIVE METABOLIC PANEL - Abnormal; Notable for the following components:      Result Value   Potassium 2.8 (*)    Chloride 99 (*)    BUN 21 (*)    Total Bilirubin 0.2 (*)    All other components within normal limits  ETHANOL - Abnormal; Notable for the following components:   Alcohol, Ethyl (B) 196 (*)    All other components within normal limits  ACETAMINOPHEN LEVEL - Abnormal; Notable for the following components:   Acetaminophen  (Tylenol), Serum <10 (*)    All other components within normal limits  RAPID URINE DRUG SCREEN, HOSP PERFORMED - Abnormal; Notable for the following components:   Benzodiazepines POSITIVE (*)    All other components within normal limits  CBC WITH DIFFERENTIAL/PLATELET  SALICYLATE LEVEL  ACETAMINOPHEN LEVEL    EKG  EKG Interpretation  Date/Time:  Wednesday August 19 2017 22:08:44 EST Ventricular Rate:  82 PR Interval:    QRS Duration: 116 QT Interval:  383 QTC Calculation: 448 R Axis:   57 Text Interpretation:  Sinus rhythm Probable left atrial enlargement Incomplete right bundle branch block Minimal ST elevation, anterior leads Baseline wander in lead(s) V4 No significant change since last tracing Confirmed by Richardean Canal [id number]) on 08/19/2017 10:27:59 PM       Radiology No results found.  Procedures Procedures (including critical care time)  Medications Ordered in ED Medications  potassium chloride 10 mEq in 100 mL IVPB (10 mEq Intravenous New Bag/Given 08/19/17 2323)  hydrochlorothiazide (HYDRODIURIL) tablet 25 mg (not administered)  lisinopril (PRINIVIL,ZESTRIL) tablet 10 mg (not administered)  sodium chloride 0.9 % bolus 1,000 mL (1,000 mLs Intravenous New Bag/Given 08/19/17 2247)  ondansetron (ZOFRAN) injection 4 mg (4 mg Intravenous Given 08/19/17 2247)  potassium chloride SA (K-DUR,KLOR-CON) CR tablet 40 mEq (40 mEq Oral Given 08/19/17 2322)     Initial Impression / Assessment and Plan / ED Course  I have reviewed the triage vital signs and the nursing notes.  Pertinent labs & imaging results that were available during my care of the patient were reviewed by me and considered in my medical decision making (see chart for details).    Janice Ramos is a 76 y.o. female who presents to ED for evaluation following intentional overdose on her Xanax and Remeron.  Patient states she took 3 of her 0.5 mg Xanax and 3 of her 30 mg of Remeron around 8:30 PM tonight.   She also endorses drinking almost a full bottle of eggnog.  She states that she took these pills in an attempt to kill herself, however now feels remorseful and no longer feels suicidal.  Case discussed with poison control who recommends  obtaining acetaminophen level at 12:30 AM.  If labs are reassuring and repeat acetaminophen level negative, at 1 am, patient can be medically cleared and be assessed by psychiatry.   Initial blood work reviewed. K+ of 2.8 which was replenished in ED today. ETOH 196. Otherwise reassuring.   Patient with no complaints on re-evaluation. Repeat acetaminophen level pending at shift change. Care assumed by oncoming provider, PA Sanders. If repeat APAP level negative, medically cleared with disposition per TTS recommendations.   Patient seen by and discussed with Dr. Silverio Lay who agrees with treatment plan.    Final Clinical Impressions(s) / ED Diagnoses   Final diagnoses:  Alcoholic intoxication without complication (HCC)  Suicidal ideation  Intentional drug overdose, initial encounter [facility])    ED Discharge Orders    None       Artavis Cowie, Chase Picket, PA-C 08/20/17 0112    Charlynne Pander, MD 08/20/17 [phone]

## 2017-08-19 NOTE — ED Notes (Signed)
ED Provider at bedside. 

## 2017-08-19 NOTE — ED Triage Notes (Addendum)
Per EMS, patient coming from home, found in parking lot stating she wants to kill herself. Endorses drinking liquor and "taking 3x her normal amount of xanax and mirtazapine." Patient A&Ox4.   Patient denies SI/HI at this time. States "a the time I was just so mad and depressed."

## 2017-08-20 DIAGNOSIS — T50902A Poisoning by unspecified drugs, medicaments and biological substances, intentional self-harm, initial encounter: Secondary | ICD-10-CM | POA: Diagnosis not present

## 2017-08-20 DIAGNOSIS — F1014 Alcohol abuse with alcohol-induced mood disorder: Secondary | ICD-10-CM | POA: Diagnosis present

## 2017-08-20 LAB — RAPID URINE DRUG SCREEN, HOSP PERFORMED
Amphetamines: NOT DETECTED
BARBITURATES: NOT DETECTED
BENZODIAZEPINES: POSITIVE — AB
Cocaine: NOT DETECTED
Opiates: NOT DETECTED
Tetrahydrocannabinol: NOT DETECTED

## 2017-08-20 LAB — ACETAMINOPHEN LEVEL

## 2017-08-20 NOTE — BH Assessment (Signed)
Tele Assessment Note   Patient Name: Janice Ramos MRN: [id number] Referring Physician: Elizabeth Sauer, PA Location of Patient: [place] Location of Provider: Behavioral Health TTS Department  Janice Ramos is an 76 y.o. female.  -Clinician reviewed note by Elizabeth Sauer, PA.  Janice Ramos is a 76 y.o. female  with a PMH of DM, HLD, bipolar disorder who presents to the Emergency Department for evaluation following intentional drug overdose.  Patient states that she has had a lot going on over the last year and just "wanted to end it all".  She states that she drank a full bottle of eggnog from a liquor store and started to think about the condition of her life.  Patient said that she did drink a ETOH egg nogg bottle.  She became more depressed than usual and began thinking about killing herself.  She is vague now during assessment about what her thoughts were at the time.  She says, "I don't know what I was thinking because I was drunk."  Patient says she usually does not drink regularly.  Patient admits to taking one extra of her mirtazepine and does not know how many extra of her xanax she may have taken.  PA's note indicates she took 3 of her xanax.  Patient went to walk to a friend's apartment to visit and the next thing she knows, people are standing over her and calling EMS.  EMS brought her to [facility].  Patient says she does not want to kill herself now.  She says she regrets drinking and doing what she did.  Patient says she wants to go home and get some rest.  She denies any current suicidality.  Pt does have two previous suicide attemps over 13 years ago.  Patient has no HI or A/V hallucinations.  Patient is disgusted with her apartment and her cat, which has ruined her apartment.  She said that the place reeks of cat urine and she is unable to prepare meals in the house because of the uncleanliness of the environment.  Patient has no family support.  Patient sees Janice Ramos with [facility] for outpatient psychiatric care.  She has a therapist there too.  Pt has been inpatient "many years ago" and cannot remember where.  -Clinician discussed patient care with Janice Conn, FNP who recommends observation of patient for safety.  Psychiatry to see patient during daily rounds.  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 Allergy   . Bipolar 1 disorder (HCC)   . Depression   . Diabetes mellitus   . Genital herpes in women 1983  . Hyperlipidemia   . Irritable bowel syndrome (IBS)   . Overdose of benzodiazepine 06/2005   Xanax  . Sinus disorder     Past Surgical History:  Procedure Laterality Date  . CATARACT EXTRACTION    . Cataract Surgery     L eye 12/2008, R eye 05/2009 [facility]  . EYE SURGERY    . TUBAL LIGATION      Family History:  Family History  Problem Relation Age of Onset  . Cancer Mother        Bone  . Suicidality Father     Social History:  reports that  has never smoked. she has never used smokeless tobacco. She reports that she drinks alcohol. She reports that she does not use drugs.  Additional Social History:  Alcohol / Drug Use Prescriptions: Xanax, Methylfendate, Mertazepine 30mg , Cyclophere Over the Counter: Tylenol when needed History of alcohol / drug use?: No history of alcohol / drug abuse  CIWA: CIWA-Ar BP: (!) 139/97 Pulse Rate: 74 Nausea and Vomiting: mild nausea with no vomiting Tactile Disturbances: none Tremor: no tremor Auditory Disturbances: not present Paroxysmal Sweats: no sweat visible Visual Disturbances: not present Anxiety: no anxiety, at ease Headache, Fullness in Head: very mild Agitation: normal activity Orientation and Clouding of Sensorium: oriented and can do serial additions CIWA-Ar Total: 2 COWS:    PATIENT STRENGTHS: (choose at least two) Ability for insight Average or above average intelligence Capable  of independent living Communication skills Motivation for treatment/growth  Allergies: No Known Allergies  Home Medications:  (Not in a hospital admission)  OB/GYN Status:  No LMP recorded. Patient is postmenopausal.  General Assessment Data Location of Assessment: [facility] ED TTS Assessment: In system Is this a Tele or Face-to-Face Assessment?: Tele Assessment Is this an Initial Assessment or a Re-assessment for this encounter?: Initial Assessment Marital status: Divorced Is patient pregnant?: No Pregnancy Status: No Living Arrangements: Alone Can pt return to current living arrangement?: Yes Admission Status: Voluntary Is patient capable of signing voluntary admission?: Yes Referral Source: Self/Family/Friend [facility]: [facility]     Crisis Care Plan Living Arrangements: Alone Name of Psychiatrist: Jake SeatsLisa Poulus at [facility] Name of Therapist: [facility]  Education Status Is patient currently in school?: No Highest grade of school patient has completed: BA in fine arts  Risk to self with the past 6 months Suicidal Ideation: No-Not Currently/Within Last 6 Months Has patient been a risk to self within the past 6 months prior to admission? : Yes Suicidal Intent: No Has patient had any suicidal intent within the past 6 months prior to admission? : No Is patient at risk for suicide?: Yes Suicidal Plan?: No Has patient had any suicidal plan within the past 6 months prior to admission? : No Access to Means: Yes Specify Access to Suicidal Means: Overdosing What has been your use of drugs/alcohol within the last 12 months?: Seldomly uses ETOH. Previous Attempts/Gestures: Yes How many times?: 2 Other Self Harm Risks: None Triggers for Past Attempts: Spouse contact Intentional Self Injurious Behavior: None Family Suicide History: Yes Recent stressful life event(s): Turmoil (Comment)(Turmoil with living conditions.) Persecutory voices/beliefs?: No Depression:  Yes Depression Symptoms: Despondent, Feeling worthless/self pity, Guilt, Loss of interest in usual pleasures, Insomnia, Isolating Substance abuse history and/or treatment for substance abuse?: No Suicide prevention information given to non-admitted patients: Not applicable  Risk to Others within the past 6 months Homicidal Ideation: No Does patient have any lifetime risk of violence toward others beyond the six months prior to admission? : No Thoughts of Harm to Others: No Current Homicidal Intent: No Current Homicidal Plan: No Access to Homicidal Means: No Identified Victim: None History of harm to others?: No Assessment of Violence: None Noted Violent Behavior Description: None reported Does patient have access to weapons?: No Criminal Charges Pending?: No Does patient have a court date: No Is patient on probation?: No  Psychosis Hallucinations: None noted Delusions: None noted  Mental Status Report Appearance/Hygiene: Disheveled, Unremarkable, In scrubs Eye Contact: Good Motor Activity: Freedom of movement, Unremarkable Speech: Logical/coherent Level of Consciousness: Alert Mood: Depressed, Anxious, Despair, Sad, Helpless Affect: Anxious, Depressed, Sad Anxiety Level: Moderate Thought Processes: Coherent, Relevant Judgement: Impaired Orientation: Person, Place, Time, Situation Obsessive Compulsive Thoughts/Behaviors: Minimal  Cognitive Functioning Concentration: Poor Memory: Remote Intact, Recent  Intact IQ: Average Insight: Fair Impulse Control: Poor Appetite: Poor Weight Loss: 0(Food preparation is a problem) Weight Gain: 0 Sleep: No Change Total Hours of Sleep: 8 Vegetative Symptoms: Decreased grooming, Staying in bed  ADLScreening [facility]) Patient's cognitive ability adequate to safely complete daily activities?: Yes Patient able to express need for assistance with ADLs?: Yes Independently performs ADLs?: Yes (appropriate for developmental  age)  Prior Inpatient Therapy Prior Inpatient Therapy: Yes Prior Therapy Dates: Long time ago Prior Therapy Facilty/Provider(s): Cannot remember Reason for Treatment: Depression  Prior Outpatient Therapy Prior Outpatient Therapy: Yes Prior Therapy Dates: Last 6-7 years Prior Therapy Facilty/Provider(s): [facility] Reason for Treatment: med management; counseling Does patient have an ACCT team?: No Does patient have Intensive In-House Services?  : No Does patient have [facility] services? : No Does patient have P4CC services?: No  ADL Screening (condition at time of admission) Patient's cognitive ability adequate to safely complete daily activities?: Yes Is the patient deaf or have difficulty hearing?: No Does the patient have difficulty seeing, even when wearing glasses/contacts?: Yes(Pt has eye problems.) Does the patient have difficulty concentrating, remembering, or making decisions?: Yes Patient able to express need for assistance with ADLs?: Yes Does the patient have difficulty dressing or bathing?: No Independently performs ADLs?: Yes (appropriate for developmental age) Does the patient have difficulty walking or climbing stairs?: No Weakness of Legs: None Weakness of Arms/Hands: None       Abuse/Neglect Assessment (Assessment to be complete while patient is alone) Abuse/Neglect Assessment Can Be Completed: Yes Physical Abuse: Denies Verbal Abuse: Denies Sexual Abuse: Denies Exploitation of patient/patient's resources: Denies Self-Neglect: Denies     Merchant navy officerAdvance Directives (For Healthcare) Does Patient Have a Medical Advance Directive?: No Would patient like information on creating a medical advance directive?: No - Patient declined    Additional Information 1:1 In Past 12 Months?: No CIRT Risk: No Elopement Risk: No Does patient have medical clearance?: Yes     Disposition:  Disposition Initial Assessment Completed for this Encounter: Yes Disposition  of Patient: Other dispositions Other disposition(s): Other (Comment)(Pt to be reviewed by FNP)  This service was provided via telemedicine using a 2-way, interactive audio and video technology.  Names of all persons participating in this telemedicine service and their role in this encounter. Name:  Role:   Name:  Role:   Name:  Role:   Name:  Role:     Alexandria LodgeHarvey, Shaymus Eveleth Ray 08/20/2017 4:57 AM

## 2017-08-20 NOTE — ED Provider Notes (Signed)
Assumed care from PA ward at shift change.  See her note for full H&P.  Briefly, 76 year old female that intentionally overdosed on her home Remeron and Xanax and attempt to kill herself.  She is also been drinking alcohol.  Patient observed here for 4 hours and has been cleared by poison control.  Her potassium was low and has been ordered IV and oral supplementation.  Patient medically cleared.  UDS evaluation pending.  1:52 AM Notified that patient has ripped out her IV prior to last run of potassium.  States she is going home and she is "fine", just drank too much.  I have spoken with patient, states she is upset because she cannot find her cell phone.  She does admit she has been depressed lately.  I explained to her the delay in psychiatric evaluation as she required medical clearance prior to their evaluation.  I have informed her that she is now been medically cleared and psychiatry is already been consulted to come and speak with her.  She agreed to have IV replaced for second infusion of potassium.  She was given food and drink and now is calm and cooperative.  2:56 AM Patient has once again started walking out of the room to try to leave.  States she will not stay.  She has known psychiatric history and intentionally overdosed on medications this evening with intent of self harm. IVC paperwork will be filed.  5:41 AM TTS has evaluated patient-- recommends AM psych evaluation.   Garlon HatchetSanders, Kaetlin Bullen M, PA-C 08/20/17 [id number]    Nicanor AlconPalumbo, April, MD 08/20/17 [phone]

## 2017-08-20 NOTE — BH Assessment (Addendum)
BHH Assessment Progress Note  This pt presents under IVC initiated by EDP April Palumbo, MD.  At the request of Juanetta BeetsJacqueline Norman, DO, this writer called pt's social supports to obtain collateral information.  Calls were placed to pt's daughter, Joanette GulaKaitlyn Craven ([phone]), at 13:05 and at 13:28; I left a message the first time.  As of this writing, return call is pending.  At 13:35 I called [facility], where pt sees Ellis SavageLisa Poulos and an unnamed therapist.  I left a message on voice mail requesting a call back, but script states that calls are returned within 24 hours.  As of this writing, this return call is also pending.  Doylene Canninghomas Juanmanuel Marohl, [place] Behavioral Health Coordinator [phone]  Addendum:  At 14:43 Joanette GulaKaitlyn Craven returns my call.  She reports that pt lives alone, but that she lives in [place], and checks in on the pt regularly.  She reports that she has a trusting and supportive relationship with the pt.  Ms Jewel BaizeCraven reports that pt has had problems with bipolar disorder since Ms Jewel BaizeCraven was a child, with a history of overdoses and psychiatric hospitalizations, the most recent of which was about 4 or 5 years ago.  Last summer, pt decompensated significantly, and Ms Jewel BaizeCraven and pt's sister urged her to go to the hospital, but pt refused to go.  She was neglecting personal hygiene and her household, but would bath when she had appointments or planned visitors.  This culminated in an incident in which [facility] around 22:[facility], and EMS found pt in distress in the parking lot, but again, pt refused to go with them.  About 2 months ago, pt started seeing a therapist, Rene KocherRegina, at [facility], in addition to [employer] Poulos for medication management.  She appeared to be doing better, and was generally compliant with medications.  Last night around 17:30 Ms Jewel BaizeCraven received a call from a passerby who had found pt in distress.  She advised them to  call EMS, resulting in [facility].  When asked about recent stressors, Ms Jewel BaizeCraven reports that two days ago was the first anniversary of the death of pt's ex-husband (Ms Craven's father).  While they had divorced about 40 years ago, Ms Jewel BaizeCraven was very upset by his death, and the pt had some sympathetic response to her grief.  Ms Jewel BaizeCraven also reports that pt had a cat in her household, but the pt complained about the cat urinating all over the household, including places such as the refrigerator that the cat could not possibly reach.  The pt started urging Ms Jewel BaizeCraven to take it to the animal shelter to rid herself of it, but Ms Jewel BaizeCraven refused to do this.  Finally, about one year ago, the pt got rid of the cat, however, she continues to complain about the smell and the mess that the cat left behind.  It is noteworthy, however, that Ms Jewel BaizeCraven reports no smell or mess attributable to the cat, either now or prior to its removal.  She has visited the household as recently as a month-and-a -half ago.  Ms Jewel BaizeCraven reports that pt's binge alcohol drinking from last night is extremely atypical for her.  She does not believe that pt is at risk to harm herself at this time, and plans to continue checking in on her regularly.  The only benefit that she thinks might come from hospitalizing the pt would be to stabilize her medications, which she thinks  pt has been using irregularly, based upon pt's recent erratic sleep pattern.  After staffing these details with Nanine MeansJamison Lord, DNP, it has been determined that pt does not currently present a danger to herself or others.  Juanetta BeetsJacqueline Norman, DO, has released pt from IVC.  [facility] with recommendation to continue treatment at [facility].  Pt's nurse has been notified.  Pt has signed Consent to Release Information, both to Ms Jewel BaizeCraven and to [facility], and a notification call has been placed to Ms Jewel BaizeCraven.  Doylene Canninghomas  Jayonna Meyering, [place] Behavioral Health Coordinator [phone]

## 2017-08-20 NOTE — Discharge Instructions (Signed)
For your behavioral health needs, you are advised to continue treatment at [facility]: ° °     [facility] °     [address], [address] °     , [place] [address] °     [phone] °

## 2017-08-20 NOTE — ED Notes (Signed)
Spoke to poison control, they have cleared her case.

## 2017-08-20 NOTE — ED Notes (Addendum)
Discharged via taxi-self pay

## 2017-08-20 NOTE — ED Notes (Signed)
Pt has pulled out her IV, leads, and gown and is stating "I am going home, I just drank too much I am fine"

## 2017-08-20 NOTE — BHH Suicide Risk Assessment (Signed)
Suicide Risk Assessment  Discharge Assessment   [facility]   Principal Problem: Alcohol abuse with alcohol-induced mood disorder [facility](HCC) Discharge Diagnoses:  Patient Active Problem List   Diagnosis Date Noted  . Alcohol abuse with alcohol-induced mood disorder (HCC) [F10.14] 08/20/2017    Priority: High  . Eye pain [H57.10] 03/26/2017  . Facial rash [R21] 09/05/2016  . Rash and nonspecific skin eruption [R21] 09/05/2016  . Dyshidrotic foot dermatitis [L30.1] 11/29/2015  . Ingrown toenail [L60.0] 08/16/2015  . Rash [R21] 08/06/2015  . Seborrheic dermatitis of scalp [L21.9] 01/30/2015  . Hyponatremia [E87.1] 01/30/2015  . Loss of appetite [R63.0] 01/30/2015  . Lip lesion [K13.0] 10/16/2014  . Hair loss [L65.9] 10/16/2014  . Lumbar pain [M54.5] 06/29/2014  . Trochanteric bursitis of right hip [M70.61] 06/21/2014  . Greater trochanteric bursitis of left hip [M70.62] 05/31/2014  . Healthcare maintenance [Z00.00] 05/31/2014  . Axillary mass [R22.30] 05/31/2014  . Dyshidrotic hand dermatitis [L30.1] 01/04/2014  . Right hip pain [M25.551] 10/10/2013  . Piriformis syndrome of right side [G57.01] 06/10/2013  . Dysuria [R30.0] 11/25/2012  . Syncopal episodes [R55] 04/06/2012  . Headache [R51] 12/10/2011  . Wrist pain, chronic [M25.539, G89.29] 11/17/2011  . Thumb pain [M79.646] 08/06/2011  . Trochanteric bursitis [M70.60] 06/22/2011  . 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 Fatigue [R53.83] 03/26/2011  . Weight loss [R63.4] 03/26/2011  . Pelvic pain in female [R10.2] 12/17/2010  . Abdominal pain [789.0] 12/17/2010  . Urinary incontinence [R32] 11/12/2010  . GENITAL HERPES [A60.00] 08/05/2010  . INSOMNIA [G47.00] 01/26/2009  . Pre-diabetes [R73.03] 12/15/2008  . DEPRESSION, MAJOR [F32.9] 05/17/2008  . ESSENTIAL  HYPERTENSION, BENIGN [I10] 05/17/2008  . HYPERCHOLESTEROLEMIA [E78.00] 11/05/2006  . BIPOLAR DISORDER [F31.9] 11/05/2006    Total Time spent with patient: 45 minutes  Musculoskeletal: Strength & Muscle Tone: within normal limits Gait & Station: normal Patient leans: N/A  Psychiatric Specialty Exam:   Blood pressure (!) 171/86, pulse 93, temperature 98.8 F (37.1 C), temperature source Oral, resp. rate 18, SpO2 100 %.There is no height or weight on file to calculate BMI.  General Appearance: Casual  Eye Contact::  Good  Speech:  Normal Rate409  Volume:  Normal  Mood:  Irritable, mild  Affect:  Congruent  Thought Process:  Coherent and Descriptions of Associations: Intact  Orientation:  Full (Time, Place, and Person)  Thought Content:  WDL and Logical  Suicidal Thoughts:  No  Homicidal Thoughts:  No  Memory:  Immediate;   Good Recent;   Good Remote;   Good  Judgement:  Fair  Insight:  Fair  Psychomotor Activity:  Normal  Concentration:  Good  Recall:  Good  Fund of Knowledge:Good  Language: Good  Akathisia:  No  Handed:  Right  AIMS (if indicated):     Assets:  Housing Leisure Time Physical Health Resilience Social Support  Sleep:     Cognition: WNL  ADL's:  Intact   Mental Status Per Nursing Assessment::   On Admission:   76 yo female who presented to the ED after drinking and "semi-passing out".  She also took her Xanax that was prescribed.  On assessment, she was irritable and adamant that she did not overdose.  Denies suicidal/homicidal ideations, hallucinations, and substance abuse.  She reported she bought her bourbon and egg nog for Christmas and had it early.  Does not typically drink.  She wants to leave and return to see her regular outpatient providers, signed paperwork to release information to them.  Her daughter was contacted with her permission and is not worried about her safety as she talks to her regularly and lives close by.  She reports her mother  has suffered from bipolar her whole life and has had episodes but does not feel this is one.  Stable for discharge.  Demographic Factors:  Age 75 or older, Caucasian and Living alone  Loss Factors: NA  Historical Factors: NA  Risk Reduction Factors:   Sense of responsibility to family, Positive social support and Positive therapeutic relationship  Continued Clinical Symptoms:  Irritable   Cognitive Features That Contribute To Risk:  None    Suicide Risk:  Minimal: No identifiable suicidal ideation.  Patients presenting with no risk factors but with morbid ruminations; may be classified as minimal risk based on the severity of the depressive symptoms    Plan Of Care/Follow-up recommendations:  Activity:  as tolerated Diet:  heart healthy diet  LORD, JAMISON, NP 08/20/2017, 5:42 PM

## 2017-10-09 ENCOUNTER — Other Ambulatory Visit: Payer: Self-pay

## 2017-10-09 ENCOUNTER — Encounter (HOSPITAL_COMMUNITY): Payer: Self-pay | Admitting: Emergency Medicine

## 2017-10-09 ENCOUNTER — Ambulatory Visit (HOSPITAL_COMMUNITY)
Admission: EM | Admit: 2017-10-09 | Discharge: 2017-10-09 | Disposition: A | Discharge status: Home / Self Care | Payer: Medicare Other | Attending: Internal Medicine | Admitting: Internal Medicine

## 2017-10-09 DIAGNOSIS — H5789 Other specified disorders of eye and adnexa: Secondary | ICD-10-CM | POA: Diagnosis not present

## 2017-10-09 DIAGNOSIS — T1592XA Foreign body on external eye, part unspecified, left eye, initial encounter: Secondary | ICD-10-CM

## 2017-10-09 MED ORDER — TETRACAINE HCL 0.5 % OP SOLN
OPHTHALMIC | Status: AC
  Filled 2017-10-09: qty 4

## 2017-10-09 MED ORDER — FLUORESCEIN SODIUM 0.6 MG OP STRP
ORAL_STRIP | OPHTHALMIC | Status: AC
  Filled 2017-10-09: qty 1

## 2017-10-09 NOTE — Discharge Instructions (Signed)
I last removed from the eye.  Continue your acyclovir and required eyedrops.  Please contact your eye doctor on Monday for follow-up.  If experiencing worsening symptoms overnight, decrease in vision, eye pain, sensitivity to light, go to the emergency department for further evaluation.

## 2017-10-09 NOTE — ED Triage Notes (Signed)
Pt states she has eyelashes stuck in her L eye and cant get them out. L eye is red and swollen.

## 2017-10-09 NOTE — ED Provider Notes (Signed)
[facility]    CSN: [id number] Arrival date & time: 10/09/17  1942     History   Chief Complaint Chief Complaint  Patient presents with  . Eye Problem  . Foreign Body in Eye    HPI Janice Ramos is a 77 y.o. female.   77 year old female comes in for 2-hour history of left eye irritation.  Patient states that she has had multiple eyelashes falling to her left eye, and now irritated as she has been trying to remove the eyelashes.  She has some redness around the eye, which is normal for her due to history of herpes zoster, and is still on acyclovir.  She states the redness is slightly worse due to her picking at the eye.  She denies any vision changes, photophobia, nausea, vomiting, injury to the eye.      Past Medical History:  Diagnosis Date  . Allergy   . Bipolar 1 disorder (HCC)   . Depression   . Diabetes mellitus   . Genital herpes in women 1983  . Hyperlipidemia   . Irritable bowel syndrome (IBS)   . Overdose of benzodiazepine 06/2005   Xanax  . Sinus disorder     Patient Active Problem List   Diagnosis Date Noted  . Alcohol abuse with alcohol-induced mood disorder (HCC) 08/20/2017  . Eye pain 03/26/2017  . Facial rash 09/05/2016  . Rash and nonspecific skin eruption 09/05/2016  . 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 Ingrown toenail 08/16/2015  . Rash 08/06/2015  . Seborrheic dermatitis of scalp 01/30/2015  . Hyponatremia 01/30/2015  . Loss of appetite 01/30/2015  . Lip lesion 10/16/2014  . Hair loss 10/16/2014  . Lumbar pain 06/29/2014  . Trochanteric bursitis of right hip 06/21/2014  . Greater trochanteric bursitis of left hip 05/31/2014  . Healthcare maintenance 05/31/2014  . Axillary mass 05/31/2014  . 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 Right hip pain 10/10/2013  . Piriformis syndrome of right side 06/10/2013  . Dysuria 11/25/2012  . Syncopal episodes 04/06/2012  . Headache 12/10/2011  . Wrist pain, chronic 11/17/2011    . Thumb pain 08/06/2011  . Trochanteric bursitis 06/22/2011  . 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 Fatigue 03/26/2011  . Weight loss 03/26/2011  . Pelvic pain in female 12/17/2010  . Abdominal pain 12/17/2010  . Urinary incontinence 11/12/2010  . GENITAL HERPES 08/05/2010  . INSOMNIA 01/26/2009  . Pre-diabetes 12/15/2008  . DEPRESSION, MAJOR 05/17/2008  . ESSENTIAL HYPERTENSION, BENIGN 05/17/2008  . HYPERCHOLESTEROLEMIA 11/05/2006  . BIPOLAR DISORDER 11/05/2006    Past Surgical History:  Procedure Laterality Date  . CATARACT EXTRACTION    . Cataract Surgery     L eye 12/2008, R eye 05/2009 [facility]  . EYE SURGERY    . TUBAL LIGATION      OB History    No data available       Home Medications    Prior to Admission medications   Medication Sig Start Date End Date Taking? Authorizing Provider  ALPRAZolam Prudy Feeler) 0.5 MG tablet Take 0.5 mg by mouth at bedtime as needed for anxiety.   Yes [provider]  acyclovir (ZOVIRAX) 400 MG tablet Take 1 tablet (400 mg total) 5 (five) times daily by mouth. 07/27/17   Hayden Rasmussen, NP  erythromycin ophthalmic ointment Place a 1/2 inch ribbon of ointment into the lower eyelid. 07/27/17  Hayden RasmussenMabe, David, NP  hydrochlorothiazide (HYDRODIURIL) 25 MG tablet TAKE 1 TABLET BY MOUTH EVERY DAY 06/24/17   Marquette SaaLancaster, Abigail Joseph, MD  hydrOXYzine (ATARAX/VISTARIL) 10 MG tablet Take 1 tablet (10 mg total) by mouth 3 (three) times daily as needed. Patient taking differently: Take 10 mg by mouth 3 (three) times daily as needed for anxiety.  01/22/17   Beaulah DinningGambino, Christina M, MD  lisinopril (PRINIVIL,ZESTRIL) 10 MG tablet TAKE 1 TABLET BY MOUTH EVERY DAY 06/24/17   Marquette SaaLancaster, Abigail Joseph, MD  mirtazapine (REMERON) 30 MG tablet TAKE 1 TABLET BY MOUTH EVERYDAY AT BEDTIME 07/27/17   [provider]  tobramycin (TOBREX) 0.3 %  ophthalmic solution Place 1 drop into both eyes every 6 (six) hours. 07/04/17   Elvina SidleLauenstein, Kurt, MD  traZODone (DESYREL) 50 MG tablet Take 50 mg by mouth at bedtime. Per [facility]     [provider]  valACYclovir (VALTREX) 1000 MG tablet Take 500 mg by mouth 2 (two) times daily. 07/08/17   [provider]  valACYclovir (VALTREX) 500 MG tablet Take 1 tablet (500 mg total) by mouth 2 (two) times daily. 07/04/17   Elvina SidleLauenstein, Kurt, MD  venlafaxine (EFFEXOR-XR) 150 MG 24 hr capsule Take 300 mg by mouth daily. Per [facility] [address]     [provider]    Family History Family History  Problem Relation Age of Onset  . Cancer Mother        Bone  . Suicidality Father     Social History Social History   Tobacco Use  . Smoking status: Never Smoker  . Smokeless tobacco: Never Used  Substance Use Topics  . Alcohol use: Yes    Alcohol/week: 0.0 oz    Comment: 1 x per year  . Drug use: No     Allergies   Latex   Review of Systems Review of Systems  Reason unable to perform ROS: See HPI as above.     Physical Exam Triage Vital Signs ED Triage Vitals [10/09/17 1948]  Enc Vitals Group     BP (!) 182/80     Pulse Rate 95     Resp 20     Temp 99.1 F (37.3 C)     Temp src      SpO2 99 %     Weight      Height      Head Circumference      Peak Flow      Pain Score 10     Pain Loc      Pain Edu?      Excl. in GC?    No data found.  Updated Vital Signs BP (!) 182/80   Pulse 95   Temp 99.1 F (37.3 C)   Resp 20   SpO2 99%   Visual Acuity Right Eye Distance: (S) 20/25 w/o glasses Left Eye Distance: (S) 20/70 w/o glasses Bilateral Distance: (S) 20/30 w/o glasses  Right Eye Near:   Left Eye Near:    Bilateral Near:     Physical Exam  Constitutional: She is oriented to person, place, and time. She appears well-developed and well-nourished. No distress.  HENT:  Head: Normocephalic and atraumatic.    Eyes: EOM are normal. Pupils are equal, round, and reactive to light. Right conjunctiva is not injected. Left conjunctiva is not injected.  Surrounding erythema of the eye, no swelling, increased warmth, pain on palpation.  Left eye with eyelashes seen on the sclera.  Removed with  saline irrigation and cotton swab.  Fluorescein stain without uptake.  Neurological: She is alert and oriented to person, place, and time.    UC Treatments / Results  Labs (all labs ordered are listed, but only abnormal results are displayed) Labs Reviewed - No data to display  EKG  EKG Interpretation None       Radiology No results found.  Procedures Procedures (including critical care time)  Medications Ordered in UC Medications - No data to display   Initial Impression / Assessment and Plan / UC Course  I have reviewed the triage vital signs and the nursing notes.  Pertinent labs & imaging results that were available during my care of the patient were reviewed by me and considered in my medical decision making (see chart for details).    77 year old female with history of herpes zoster in both eyes, currently still on acyclovir comes in for 2-hour history of left eye irritation.  Patient states irritation due to her trying to remove eyelashes in the eye.  On exam, patient with visual acuity of 20/70 of the left eye, which patient states is normal for her.  Eyelashes was seen in the sclera, which was removed with saline irrigation and cotton swab.  Fluorescein stain without uptake.  Will have patient monitor and follow-up with her ophthalmologist as needed.  Return precautions given.  Patient expresses understanding and agrees to plan.  Final Clinical Impressions(s) / UC Diagnoses   Final diagnoses:  Eye irritation  Foreign body of left eye, initial encounter    ED Discharge Orders    None       Belinda Fisher, PA-C 10/09/17 2050

## 2018-01-22 ENCOUNTER — Inpatient Hospital Stay (HOSPITAL_COMMUNITY)
Admission: EM | Admit: 2018-01-22 | Discharge: 2018-01-26 | DRG: 918 | Disposition: A | Discharge status: Other Acute Inpatient Hospital | Payer: Medicare Other | Attending: Family Medicine | Admitting: Family Medicine

## 2018-01-22 ENCOUNTER — Inpatient Hospital Stay (HOSPITAL_COMMUNITY): Payer: Medicare Other

## 2018-01-22 ENCOUNTER — Other Ambulatory Visit: Payer: Self-pay

## 2018-01-22 ENCOUNTER — Encounter (HOSPITAL_COMMUNITY): Payer: Self-pay | Admitting: Emergency Medicine

## 2018-01-22 DIAGNOSIS — R4587 Impulsiveness: Secondary | ICD-10-CM

## 2018-01-22 DIAGNOSIS — R109 Unspecified abdominal pain: Secondary | ICD-10-CM

## 2018-01-22 DIAGNOSIS — F419 Anxiety disorder, unspecified: Secondary | ICD-10-CM | POA: Diagnosis present

## 2018-01-22 DIAGNOSIS — Z79899 Other long term (current) drug therapy: Secondary | ICD-10-CM | POA: Diagnosis not present

## 2018-01-22 DIAGNOSIS — Z62898 Other specified problems related to upbringing: Secondary | ICD-10-CM

## 2018-01-22 DIAGNOSIS — Z915 Personal history of self-harm: Secondary | ICD-10-CM | POA: Diagnosis not present

## 2018-01-22 DIAGNOSIS — T50902A Poisoning by unspecified drugs, medicaments and biological substances, intentional self-harm, initial encounter: Secondary | ICD-10-CM | POA: Insufficient documentation

## 2018-01-22 DIAGNOSIS — R402132 Coma scale, eyes open, to sound, at arrival to emergency department: Secondary | ICD-10-CM | POA: Diagnosis present

## 2018-01-22 DIAGNOSIS — R402362 Coma scale, best motor response, obeys commands, at arrival to emergency department: Secondary | ICD-10-CM | POA: Diagnosis present

## 2018-01-22 DIAGNOSIS — K589 Irritable bowel syndrome without diarrhea: Secondary | ICD-10-CM | POA: Diagnosis present

## 2018-01-22 DIAGNOSIS — I959 Hypotension, unspecified: Secondary | ICD-10-CM | POA: Diagnosis present

## 2018-01-22 DIAGNOSIS — F319 Bipolar disorder, unspecified: Secondary | ICD-10-CM | POA: Diagnosis present

## 2018-01-22 DIAGNOSIS — F332 Major depressive disorder, recurrent severe without psychotic features: Secondary | ICD-10-CM | POA: Diagnosis not present

## 2018-01-22 DIAGNOSIS — R339 Retention of urine, unspecified: Secondary | ICD-10-CM | POA: Diagnosis not present

## 2018-01-22 DIAGNOSIS — T424X2A Poisoning by benzodiazepines, intentional self-harm, initial encounter: Principal | ICD-10-CM | POA: Diagnosis present

## 2018-01-22 DIAGNOSIS — F1099 Alcohol use, unspecified with unspecified alcohol-induced disorder: Secondary | ICD-10-CM | POA: Diagnosis not present

## 2018-01-22 DIAGNOSIS — Z598 Other problems related to housing and economic circumstances: Secondary | ICD-10-CM

## 2018-01-22 DIAGNOSIS — R402242 Coma scale, best verbal response, confused conversation, at arrival to emergency department: Secondary | ICD-10-CM | POA: Diagnosis present

## 2018-01-22 DIAGNOSIS — E785 Hyperlipidemia, unspecified: Secondary | ICD-10-CM | POA: Diagnosis present

## 2018-01-22 DIAGNOSIS — E7800 Pure hypercholesterolemia, unspecified: Secondary | ICD-10-CM | POA: Diagnosis present

## 2018-01-22 DIAGNOSIS — T1491XA Suicide attempt, initial encounter: Secondary | ICD-10-CM

## 2018-01-22 DIAGNOSIS — M25519 Pain in unspecified shoulder: Secondary | ICD-10-CM | POA: Diagnosis not present

## 2018-01-22 DIAGNOSIS — E119 Type 2 diabetes mellitus without complications: Secondary | ICD-10-CM | POA: Diagnosis present

## 2018-01-22 DIAGNOSIS — Z9842 Cataract extraction status, left eye: Secondary | ICD-10-CM

## 2018-01-22 DIAGNOSIS — R3989 Other symptoms and signs involving the genitourinary system: Secondary | ICD-10-CM | POA: Diagnosis present

## 2018-01-22 DIAGNOSIS — Z818 Family history of other mental and behavioral disorders: Secondary | ICD-10-CM | POA: Diagnosis not present

## 2018-01-22 DIAGNOSIS — I10 Essential (primary) hypertension: Secondary | ICD-10-CM | POA: Diagnosis present

## 2018-01-22 DIAGNOSIS — K5900 Constipation, unspecified: Secondary | ICD-10-CM | POA: Diagnosis not present

## 2018-01-22 DIAGNOSIS — G4700 Insomnia, unspecified: Secondary | ICD-10-CM | POA: Diagnosis present

## 2018-01-22 DIAGNOSIS — Z9841 Cataract extraction status, right eye: Secondary | ICD-10-CM | POA: Diagnosis not present

## 2018-01-22 DIAGNOSIS — R069 Unspecified abnormalities of breathing: Secondary | ICD-10-CM

## 2018-01-22 DIAGNOSIS — Y92009 Unspecified place in unspecified non-institutional (private) residence as the place of occurrence of the external cause: Secondary | ICD-10-CM

## 2018-01-22 DIAGNOSIS — B0052 Herpesviral keratitis: Secondary | ICD-10-CM | POA: Diagnosis present

## 2018-01-22 LAB — COMPREHENSIVE METABOLIC PANEL
ALBUMIN: 3.4 g/dL — AB (ref 3.5–5.0)
ALK PHOS: 59 U/L (ref 38–126)
ALT: 18 U/L (ref 14–54)
ANION GAP: 8 (ref 5–15)
AST: 28 U/L (ref 15–41)
BILIRUBIN TOTAL: 0.5 mg/dL (ref 0.3–1.2)
BUN: 15 mg/dL (ref 6–20)
CALCIUM: 8.4 mg/dL — AB (ref 8.9–10.3)
CO2: 26 mmol/L (ref 22–32)
Chloride: 98 mmol/L — ABNORMAL LOW (ref 101–111)
Creatinine, Ser: 0.92 mg/dL (ref 0.44–1.00)
GFR calc Af Amer: >60 mL/min (ref >60)
GFR, EST NON AFRICAN AMERICAN: 59 mL/min — AB (ref >60)
GLUCOSE: 92 mg/dL (ref 65–99)
Potassium: 3.2 mmol/L — ABNORMAL LOW (ref 3.5–5.1)
Sodium: 132 mmol/L — ABNORMAL LOW (ref 135–145)
TOTAL PROTEIN: 6.1 g/dL — AB (ref 6.5–8.1)

## 2018-01-22 LAB — CBC
HCT: 34.6 % — ABNORMAL LOW (ref 36.0–46.0)
Hemoglobin: 11.5 g/dL — ABNORMAL LOW (ref 12.0–15.0)
MCH: 30.9 pg (ref 26.0–34.0)
MCHC: 33.2 g/dL (ref 30.0–36.0)
MCV: 93 fL (ref 78.0–100.0)
Platelets: 392 10*3/uL (ref 150–400)
RBC: 3.72 MIL/uL — ABNORMAL LOW (ref 3.87–5.11)
RDW: 12.6 % (ref 11.5–15.5)
WBC: 8.7 10*3/uL (ref 4.0–10.5)

## 2018-01-22 LAB — URINALYSIS, ROUTINE W REFLEX MICROSCOPIC
BILIRUBIN URINE: NEGATIVE
Glucose, UA: NEGATIVE mg/dL
KETONES UR: NEGATIVE mg/dL
NITRITE: NEGATIVE
PH: 7 (ref 5.0–8.0)
Protein, ur: NEGATIVE mg/dL
Specific Gravity, Urine: 1.002 — ABNORMAL LOW (ref 1.005–1.030)

## 2018-01-22 LAB — CBG MONITORING, ED: Glucose-Capillary: 75 mg/dL (ref 65–99)

## 2018-01-22 LAB — RAPID URINE DRUG SCREEN, HOSP PERFORMED
Amphetamines: NOT DETECTED
Barbiturates: NOT DETECTED
Benzodiazepines: POSITIVE — AB
Cocaine: NOT DETECTED
Opiates: NOT DETECTED
TETRAHYDROCANNABINOL: NOT DETECTED

## 2018-01-22 LAB — ETHANOL: Alcohol, Ethyl (B): <10 mg/dL (ref <10)

## 2018-01-22 LAB — ACETAMINOPHEN LEVEL

## 2018-01-22 LAB — SALICYLATE LEVEL: Salicylate Lvl: <7 mg/dL (ref 2.8–30.0)

## 2018-01-22 MED ORDER — ENOXAPARIN SODIUM 40 MG/0.4ML ~~LOC~~ SOLN
40.0000 mg | SUBCUTANEOUS | Status: DC
  Administered 2018-01-22 – 2018-01-25 (×4): 40 mg via SUBCUTANEOUS
  Filled 2018-01-22 (×6): qty 0.4

## 2018-01-22 MED ORDER — LORAZEPAM 1 MG PO TABS
0.0000 mg | ORAL_TABLET | Freq: Two times a day (BID) | ORAL | Status: DC
  Administered 2018-01-25: 1 mg via ORAL
  Filled 2018-01-22: qty 1

## 2018-01-22 MED ORDER — LORAZEPAM 1 MG PO TABS: 0.0000 mg | ORAL_TABLET | Freq: Four times a day (QID) | ORAL | Status: AC

## 2018-01-22 MED ORDER — LORAZEPAM 2 MG/ML IJ SOLN
0.0000 mg | Freq: Four times a day (QID) | INTRAMUSCULAR | Status: AC
  Administered 2018-01-24: 2 mg via INTRAVENOUS
  Filled 2018-01-22 (×2): qty 1

## 2018-01-22 MED ORDER — SODIUM CHLORIDE 0.9 % IV SOLN: INTRAVENOUS | Status: DC

## 2018-01-22 MED ORDER — LORAZEPAM 2 MG/ML IJ SOLN: 0.0000 mg | Freq: Two times a day (BID) | INTRAMUSCULAR | Status: DC

## 2018-01-22 MED ORDER — VITAMIN B-1 100 MG PO TABS
100.0000 mg | ORAL_TABLET | Freq: Every day | ORAL | Status: DC
  Administered 2018-01-23 – 2018-01-26 (×4): 100 mg via ORAL
  Filled 2018-01-22 (×4): qty 1

## 2018-01-22 MED ORDER — ZOLPIDEM TARTRATE 5 MG PO TABS
5.0000 mg | ORAL_TABLET | Freq: Every evening | ORAL | Status: DC | PRN
  Administered 2018-01-24 – 2018-01-25 (×2): 5 mg via ORAL
  Filled 2018-01-22 (×2): qty 1

## 2018-01-22 MED ORDER — SODIUM CHLORIDE 0.9 % IV SOLN
Freq: Once | INTRAVENOUS | Status: AC
Start: 2018-01-22 — End: 2018-01-22
  Administered 2018-01-22: 04:00:00 via INTRAVENOUS

## 2018-01-22 MED ORDER — ONDANSETRON HCL 4 MG PO TABS: 4.0000 mg | ORAL_TABLET | Freq: Three times a day (TID) | ORAL | Status: DC | PRN

## 2018-01-22 MED ORDER — THIAMINE HCL 100 MG/ML IJ SOLN
100.0000 mg | Freq: Every day | INTRAMUSCULAR | Status: DC
  Administered 2018-01-22: 100 mg via INTRAVENOUS
  Filled 2018-01-22: qty 2

## 2018-01-22 NOTE — H&P (Signed)
Family Medicine Teaching [facility] Admission History and Physical Service Pager: [phone]  Patient name: Janice Ramos Medical record number: [id number] Date of birth: July 13, 1941 Age: 77 y.o. Gender: female  Primary Care Provider: Marquette Saa, MD Consultants: CCM Code Status: full  Chief Complaint: xanax overdose  Assessment and Plan: Janice Ramos is a 77 y.o. female presenting with intentional BZD overdose. 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  Intentional benzodiazepine overdose- patient chronically on xanax for anxiety, she has 1 mg pills at home and states she took the whole bottle with intention of suicide. She is hypotensive in ED which is responding to IVF and remains somnolent but will awaken with sternal rub and answer some questions. CCM was consulted for possible admission but patient is protecting airway without acute need for intubation at this time and was deemed appropriate for step down. IVC paperwork completed by ED provider. -admit to inpatient, attending Dr. Leveda Anna -monitor on stepdown, continuous cardiac and pulse oximetry -q1 hour neuro checks -continue IVF at 125cc/hr, may need to bolus if BP gets low -suicide precautions, sitter ordered -NPO until mental status improves -may need to insert Foley for urinary retention if patient unable to void on her own -will c/s psychiatry once patient medically stable  HSV keratitis, HSV1 and 2- chronic problems for patient, she has seen ID in past, acyclovir 400 mg five times daily as well as valtrex 500 mg BID on med list. She also has erythromycin eye ointment and tobramycin eye drops on med list which appear to have been prescribed last year. No signs of active infection on exam. -hold antivirals -consider eye drops if pain is problem -may need to discuss with ID if patient needs to be on chronic  suppression as she has seen them in the past  Depression/bipolar disorder- effexor 300 mg every day, hydroxyzine 10 mg TID as needed, xanax 0.5 mg every night as needed, remeron 30 mg every night, and trazodone 50 mg every night on home med list -hold all psych meds -c/s psych as above  HTN- currently hypotensive, lisinopril 10 mg and HCTZ 25 mg on home med list -hold home meds    FEN/GI: NPO except ice chips Prophylaxis: lovenox  Disposition: admit to step down  History of Present Illness:  Janice Ramos is a 77 y.o. female presenting with benzodiazepine overdose  Patient reports she took a full bottle of her home xanax 1 mg tablets with intention of killing herself. She is currently tearful and complaining of eye pain secondary to herpes and bladder pain. She lives alone.   Review Of Systems: Per HPI with the following additions:   Review of Systems  Unable to perform ROS: Psychiatric disorder    Patient Active Problem List   Diagnosis Date Noted  . Benzodiazepine overdose, intentional self-harm, initial encounter (HCC) 01/22/2018  . Alcohol abuse with alcohol-induced mood disorder (HCC) 08/20/2017  . Eye pain 03/26/2017  . Facial rash 09/05/2016  . Rash and nonspecific skin eruption 09/05/2016  . 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 Ingrown toenail 08/16/2015  . Rash 08/06/2015  . Seborrheic dermatitis of scalp 01/30/2015  . Hyponatremia 01/30/2015  . Loss of appetite 01/30/2015  . Lip lesion 10/16/2014  . Hair loss 10/16/2014  . Lumbar pain 06/29/2014  . Trochanteric bursitis of right hip 06/21/2014  . Greater trochanteric bursitis of left hip 05/31/2014  .  Healthcare maintenance 05/31/2014  . Axillary mass 05/31/2014  . 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 Right hip pain 10/10/2013  . Piriformis syndrome of right side 06/10/2013  . Dysuria 11/25/2012  . Syncopal episodes 04/06/2012  . Headache 12/10/2011  . Wrist pain, chronic 11/17/2011  . Thumb  pain 08/06/2011  . Trochanteric bursitis 06/22/2011  . 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 Fatigue 03/26/2011  . Weight loss 03/26/2011  . Pelvic pain in female 12/17/2010  . Abdominal pain 12/17/2010  . Urinary incontinence 11/12/2010  . GENITAL HERPES 08/05/2010  . INSOMNIA 01/26/2009  . Pre-diabetes 12/15/2008  . DEPRESSION, MAJOR 05/17/2008  . ESSENTIAL HYPERTENSION, BENIGN 05/17/2008  . HYPERCHOLESTEROLEMIA 11/05/2006  . BIPOLAR DISORDER 11/05/2006    Past Medical History: Past Medical History:  Diagnosis Date  . Allergy   . Bipolar 1 disorder (HCC)   . Depression   . Diabetes mellitus   . Genital herpes in women 1983  . Hyperlipidemia   . Irritable bowel syndrome (IBS)   . Overdose of benzodiazepine 06/2005   Xanax  . Sinus disorder     Past Surgical History: Past Surgical History:  Procedure Laterality Date  . CATARACT EXTRACTION    . Cataract Surgery     L eye 12/2008, R eye 05/2009 [facility]  . EYE SURGERY    . TUBAL LIGATION      Social History: Social History   Tobacco Use  . Smoking status: Never Smoker  . Smokeless tobacco: Never Used  Substance Use Topics  . Alcohol use: Yes    Alcohol/week: 0.0 oz    Comment: 1 x per year  . Drug use: No   Additional social history: lives alone  Please also refer to relevant sections of EMR.  Family History: Family History  Problem Relation Age of Onset  . Cancer Mother        Bone  . Suicidality Father    Allergies and Medications: Allergies  Allergen Reactions  . Latex    No current facility-administered medications on file prior to encounter.    Current Outpatient Medications on File Prior to Encounter  Medication Sig Dispense Refill  . acyclovir (ZOVIRAX) 400 MG tablet Take 1 tablet (400 mg total) 5 (five) times daily by mouth. 30 tablet 0  . ALPRAZolam (XANAX) 0.5 MG tablet Take  0.5 mg by mouth at bedtime as needed for anxiety.    Marland Kitchen erythromycin ophthalmic ointment Place a 1/2 inch ribbon of ointment into the lower eyelid. 1 g 0  . hydrochlorothiazide (HYDRODIURIL) 25 MG tablet TAKE 1 TABLET BY MOUTH EVERY DAY 90 tablet 3  . hydrOXYzine (ATARAX/VISTARIL) 10 MG tablet Take 1 tablet (10 mg total) by mouth 3 (three) times daily as needed. (Patient taking differently: Take 10 mg by mouth 3 (three) times daily as needed for anxiety. ) 60 tablet 2  . lisinopril (PRINIVIL,ZESTRIL) 10 MG tablet TAKE 1 TABLET BY MOUTH EVERY DAY 90 tablet 3  . mirtazapine (REMERON) 30 MG tablet TAKE 1 TABLET BY MOUTH EVERYDAY AT BEDTIME  0  . tobramycin (TOBREX) 0.3 % ophthalmic solution Place 1 drop into both eyes every 6 (six) hours. 5 mL 0  . traZODone (DESYREL) 50 MG tablet Take 50 mg by mouth at bedtime. Per [facility]     . valACYclovir (VALTREX) 1000 MG tablet Take 500 mg by mouth 2 (two) times daily.  4  . valACYclovir (VALTREX) 500 MG tablet Take 1 tablet (500 mg total) by mouth 2 (two) times daily. 6 tablet 10  . venlafaxine (EFFEXOR-XR) 150 MG 24 hr capsule Take 300 mg by mouth daily. Per [facility] on [address]       Objective: BP 129/85   Pulse 64   Temp (!) 97 F (36.1 C) (Temporal)   Resp 14   SpO2 100%  Exam: General: somnolent, awakens with sternal rub, in NAD Eyes: watery ENTM: dry lips, MMM Neck: supple Cardiovascular: RRR, no MRG Respiratory: clear bilaterally, normal work of breathing Gastrointestinal: soft, distended bladder tender to palpation MSK: thin extremities, no edema or cyanosis Derm: no lesions appreciated Neuro: moves all extremities, unable to fully assess  Psych: active SI  Labs and Imaging: CBC BMET  Recent Labs  Lab 01/22/18 0124  WBC 8.7  HGB 11.5*  HCT 34.6*  PLT 392   Recent Labs  Lab 01/22/18 0124  NA 132*  K 3.2*  CL 98*  CO2 26  BUN 15  CREATININE 0.92  GLUCOSE 92  CALCIUM 8.4*      Tillman Sers, DO 01/22/2018, 6:12 AM PGY-2, [facility] FPTS Intern pager: [phone], text pages welcome

## 2018-01-22 NOTE — ED Notes (Signed)
Pt transferred to POD F from ED room 41; care assumed at this time; pt resting quietly on stretcher with eyes closed; easily arousable; tearful, slow to respond as she is seemingly upset; pt c/o pain to rgt shoulder 2/2 recent fall; pain increases with movement and worse with abduction; deformity palpated; strong radial pulses; pt informed of intent to xray shoulder - pt in agreement - sitter at bedside as pt was and is currently suicidal; ccm showing sinus brady rate 68 without ectopy

## 2018-01-22 NOTE — ED Notes (Signed)
Pt responsive to pain, answering some questions at this time.

## 2018-01-22 NOTE — ED Notes (Signed)
Ordered diet tray for pt  

## 2018-01-22 NOTE — ED Provider Notes (Signed)
[facility] EMERGENCY DEPARTMENT Provider Note   CSN: [id number] Arrival date & time: 01/22/18  0119     History   Chief Complaint Chief Complaint  Patient presents with  . Drug Overdose  . Suicidal    HPI Janice Ramos is a 77 y.o. female.  Patient brought to the emergency department by ambulance after intentional overdose.  Patient wrote a suicide note tonight and then took approximately forty 1 mg Xanax tablets as a suicide attempt.  Patient extremely somnolent at arrival, reports depression and intention to kill herself for some time.     Past Medical History:  Diagnosis Date  . Allergy   . Bipolar 1 disorder (HCC)   . Depression   . Diabetes mellitus   . Genital herpes in women 1983  . Hyperlipidemia   . Irritable bowel syndrome (IBS)   . Overdose of benzodiazepine 06/2005   Xanax  . Sinus disorder     Patient Active Problem List   Diagnosis Date Noted  . Alcohol abuse with alcohol-induced mood disorder (HCC) 08/20/2017  . Eye pain 03/26/2017  . Facial rash 09/05/2016  . Rash and nonspecific skin eruption 09/05/2016  . 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 Ingrown toenail 08/16/2015  . Rash 08/06/2015  . Seborrheic dermatitis of scalp 01/30/2015  . Hyponatremia 01/30/2015  . Loss of appetite 01/30/2015  . Lip lesion 10/16/2014  . Hair loss 10/16/2014  . Lumbar pain 06/29/2014  . Trochanteric bursitis of right hip 06/21/2014  . Greater trochanteric bursitis of left hip 05/31/2014  . Healthcare maintenance 05/31/2014  . Axillary mass 05/31/2014  . 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 Right hip pain 10/10/2013  . Piriformis syndrome of right side 06/10/2013  . Dysuria 11/25/2012  . Syncopal episodes 04/06/2012  . Headache 12/10/2011  . Wrist pain, chronic 11/17/2011  . Thumb pain 08/06/2011  . Trochanteric bursitis 06/22/2011  . 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 HSV-1 (herpes simplex virus 1)  infection 06/18/2011  . 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 Fatigue 03/26/2011  . Weight loss 03/26/2011  . Pelvic pain in female 12/17/2010  . Abdominal pain 12/17/2010  . Urinary incontinence 11/12/2010  . GENITAL HERPES 08/05/2010  . INSOMNIA 01/26/2009  . Pre-diabetes 12/15/2008  . DEPRESSION, MAJOR 05/17/2008  . ESSENTIAL HYPERTENSION, BENIGN 05/17/2008  . HYPERCHOLESTEROLEMIA 11/05/2006  . BIPOLAR DISORDER 11/05/2006    Past Surgical History:  Procedure Laterality Date  . CATARACT EXTRACTION    . Cataract Surgery     L eye 12/2008, R eye 05/2009 [facility]  . EYE SURGERY    . TUBAL LIGATION       OB History   None      Home Medications    Prior to Admission medications   Medication Sig Start Date End Date Taking? Authorizing Provider  acyclovir (ZOVIRAX) 400 MG tablet Take 1 tablet (400 mg total) 5 (five) times daily by mouth. 07/27/17   Mabe, Onalee Hua, NP  ALPRAZolam Prudy Feeler) 0.5 MG tablet Take 0.5 mg by mouth at bedtime as needed for anxiety.    [provider]  erythromycin ophthalmic ointment Place a 1/2 inch ribbon of ointment into the lower eyelid. 07/27/17   Hayden Rasmussen, NP  hydrochlorothiazide (HYDRODIURIL) 25 MG tablet TAKE 1 TABLET BY MOUTH EVERY DAY 06/24/17   Marquette Saa, MD  hydrOXYzine (ATARAX/VISTARIL) 10 MG tablet Take 1 tablet (10 mg total) by mouth 3 (three) times daily as needed. Patient taking differently: Take  10 mg by mouth 3 (three) times daily as needed for anxiety.  01/22/17   Beaulah Dinning, MD  lisinopril (PRINIVIL,ZESTRIL) 10 MG tablet TAKE 1 TABLET BY MOUTH EVERY DAY 06/24/17   Marquette Saa, MD  mirtazapine (REMERON) 30 MG tablet TAKE 1 TABLET BY MOUTH EVERYDAY AT BEDTIME 07/27/17   [provider]  tobramycin (TOBREX) 0.3 % ophthalmic solution Place 1 drop into both eyes every 6 (six) hours. 07/04/17   Elvina Sidle, MD  traZODone (DESYREL) 50 MG tablet Take 50 mg by mouth at bedtime. Per  [facility]     [provider]  valACYclovir (VALTREX) 1000 MG tablet Take 500 mg by mouth 2 (two) times daily. 07/08/17   [provider]  valACYclovir (VALTREX) 500 MG tablet Take 1 tablet (500 mg total) by mouth 2 (two) times daily. 07/04/17   Elvina Sidle, MD  venlafaxine (EFFEXOR-XR) 150 MG 24 hr capsule Take 300 mg by mouth daily. Per [facility] on [address], Historical, MD    Family History Family History  Problem Relation Age of Onset  . Cancer Mother        Bone  . Suicidality Father     Social History Social History   Tobacco Use  . Smoking status: Never Smoker  . Smokeless tobacco: Never Used  Substance Use Topics  . Alcohol use: Yes    Alcohol/week: 0.0 oz    Comment: 1 x per year  . Drug use: No     Allergies   Latex   Review of Systems Review of Systems  Psychiatric/Behavioral: Positive for dysphoric mood and suicidal ideas.  All other systems reviewed and are negative.    Physical Exam Updated Vital Signs BP (!) 90/59   Pulse 64   Temp (!) 97 F (36.1 C) (Temporal)   Resp 16   SpO2 100%   Physical Exam  Constitutional: She appears well-developed and well-nourished. She appears lethargic. No distress.  HENT:  Head: Normocephalic and atraumatic.  Right Ear: Hearing normal.  Left Ear: Hearing normal.  Nose: Nose normal.  Mouth/Throat: Oropharynx is clear and moist and mucous membranes are normal.  Eyes: Pupils are equal, round, and reactive to light. Conjunctivae and EOM are normal.  Neck: Normal range of motion. Neck supple.  Cardiovascular: Regular rhythm, S1 normal and S2 normal. Exam reveals no gallop and no friction rub.  No murmur heard. Pulmonary/Chest: Effort normal and breath sounds normal. No respiratory distress. She exhibits no tenderness.  Abdominal: Soft. Normal appearance and bowel sounds are normal. There is no hepatosplenomegaly. There is no tenderness. There is no  rebound, no guarding, no tenderness at McBurney's point and negative Murphy's sign. No hernia.  Musculoskeletal: Normal range of motion.  Neurological: She has normal strength. She appears lethargic. No cranial nerve deficit or sensory deficit. Coordination normal. GCS eye subscore is 3. GCS verbal subscore is 4. GCS motor subscore is 6.  Skin: Skin is warm, dry and intact. No rash noted. No cyanosis.  Psychiatric: Her speech is delayed and slurred. She is slowed. She exhibits a depressed mood. She expresses suicidal ideation.  Nursing note and vitals reviewed.    ED Treatments / Results  Labs (all labs ordered are listed, but only abnormal results are displayed) Labs Reviewed  COMPREHENSIVE METABOLIC PANEL - Abnormal; Notable for the following components:      Result Value   Sodium 132 (*)    Potassium 3.2 (*)  Chloride 98 (*)    Calcium 8.4 (*)    Total Protein 6.1 (*)    Albumin 3.4 (*)    GFR calc non Af Amer 59 (*)    All other components within normal limits  CBC - Abnormal; Notable for the following components:   RBC 3.72 (*)    Hemoglobin 11.5 (*)    HCT 34.6 (*)    All other components within normal limits  RAPID URINE DRUG SCREEN, HOSP PERFORMED - Abnormal; Notable for the following components:   Benzodiazepines POSITIVE (*)    All other components within normal limits  URINALYSIS, ROUTINE W REFLEX MICROSCOPIC - Abnormal; Notable for the following components:   Color, Urine STRAW (*)    Specific Gravity, Urine 1.002 (*)    Hgb urine dipstick LARGE (*)    Leukocytes, UA LARGE (*)    Bacteria, UA FEW (*)    All other components within normal limits  ETHANOL  SALICYLATE LEVEL  ACETAMINOPHEN LEVEL    EKG EKG Interpretation  Date/Time:  Friday Jan 22 2018 01:22:59 EDT Ventricular Rate:  69 PR Interval:    QRS Duration: 70 QT Interval:  420 QTC Calculation: 450 R Axis:   60 Text Interpretation:  Sinus rhythm Probable left atrial enlargement Confirmed by  Gilda Crease [phone]) on 01/22/2018 1:41:29 AM   Radiology No results found.  Procedures Procedures (including critical care time)  Medications Ordered in ED Medications  0.9 %  sodium chloride infusion ( Intravenous Rate/Dose Change 01/22/18 0446)     Initial Impression / Assessment and Plan / ED Course  I have reviewed the triage vital signs and the nursing notes.  Pertinent labs & imaging results that were available during my care of the patient were reviewed by me and considered in my medical decision making (see chart for details).     Patient presents to the emergency department after intentional overdose.  Patient took up to forty 1 mg Xanax tablets as a suicide attempt.  She is somnolent at arrival.  She is arousable to stimuli, occasionally needs a sternal rub to awaken and then has slurred speech and answers questions.  She has been intermittently hypotensive, responding to IV fluids.  Patient continues to have a gag reflex.  I do not believe she requires intubation at this time.  She has been monitored for a number of hours without worsening of her condition.  Discussed briefly with Dr. Kendrick Fries, on-call for critical care.  Does feel that the patient would be safe for stepdown unit admission by primary service.  CRITICAL CARE Performed by: Gilda Crease   Total critical care time: 30 minutes  Critical care time was exclusive of separately billable procedures and treating other patients.  Critical care was necessary to treat or prevent imminent or life-threatening deterioration.  Critical care was time spent personally by me on the following activities: development of treatment plan with patient and/or surrogate as well as nursing, discussions with consultants, evaluation of patient's response to treatment, examination of patient, obtaining history from patient or surrogate, ordering and performing treatments and interventions, ordering and review of  laboratory studies, ordering and review of radiographic studies, pulse oximetry and re-evaluation of patient's condition.    Final Clinical Impressions(s) / ED Diagnoses   Final diagnoses:  Intentional drug overdose, initial encounter [facility])    ED Discharge Orders    None       Gilda Crease, MD 01/22/18 [phone]

## 2018-01-22 NOTE — Progress Notes (Signed)
Family medicine teaching service progress note  Subjective: doing better. Says that she has some new onset shoulder pain, but no longer complaining of eye pain. Apparently gets her psych scripts from [facility].  Exam: head: [place] AT Eyes: eomi, perrla, no swelling or exam findings to explain eye pain Cardiac: rrr, no m/r/g, palpable peripheral pulses Lungs: lungs CTAB, symmetric chest rise Gi: soft, non-tender, nondistended Extremities: 5/5 strength BLE, BUE Neuro: AOx4, cn 2-12 intact  Assessment: 77 year old female who presents due to overdose with xanax. Much more alert than on Dr. Purnell Shoemaker exam. Likely medically stable at this point from OD, although will have to be on the lookout for w/d symptoms. When stable likely will need inpatient psych admission. IVC'd in ED.  Plan: f/u psych recs Monitor for withdrawal Likely restart benzo at some point later in day to prevent withdrawal  [name] MD PGY-1 [facility] Medicine Resident

## 2018-01-22 NOTE — ED Triage Notes (Signed)
Pt BIB [facility] after an intentional overdose of alprazolam  at 2230 tonight. Pt drowsy, answering questions appropriately. +ETOH

## 2018-01-22 NOTE — Consult Note (Addendum)
[facility] Face-to-Face Psychiatry Consult   Reason for Consult:  Suicide attempt  Referring Physician:  Dr. Andria Frames Patient Identification: Janice Ramos MRN:  [id number] Principal Diagnosis: MDD (major depressive disorder), recurrent severe, without psychosis ([place]) Diagnosis:   Patient Active Problem List   Diagnosis Date Noted  . Benzodiazepine overdose, intentional self-harm, initial encounter ([place]) [T42.4X2A] 01/22/2018  . Intentional drug overdose ([place]) [T50.902A]   . Severe episode of recurrent major depressive disorder, without psychotic features ([place]) [F33.2]   . Alcohol abuse with alcohol-induced mood disorder ([place]) [F10.14] 08/20/2017  . Eye pain [H57.10] 03/26/2017  . Dyshidrotic foot dermatitis [L30.1] 11/29/2015  . Ingrown toenail [L60.0] 08/16/2015  . Seborrheic dermatitis of scalp [L21.9] 01/30/2015  . Loss of appetite [R63.0] 01/30/2015  . Hair loss [L65.9] 10/16/2014  . Lumbar pain [M54.5] 06/29/2014  . Trochanteric bursitis of right hip [M70.61] 06/21/2014  . Greater trochanteric bursitis of left hip [M70.62] 05/31/2014  . Healthcare maintenance [Z00.00] 05/31/2014  . Axillary mass [R22.30] 05/31/2014  . Dyshidrotic hand dermatitis [L30.1] 01/04/2014  . Right hip pain [M25.551] 10/10/2013  . Piriformis syndrome of right side [G57.01] 06/10/2013  . Syncopal episodes [R55] 04/06/2012  . Headache [R51] 12/10/2011  . Wrist pain, chronic [M25.539, G89.29] 11/17/2011  . Thumb pain [M79.646] 08/06/2011  . Trochanteric bursitis [M70.60] 06/22/2011  . 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 Fatigue [R53.83] 03/26/2011  . Weight loss [R63.4] 03/26/2011  . Urinary incontinence [R32] 11/12/2010  . GENITAL HERPES [A60.00] 08/05/2010  . INSOMNIA [G47.00] 01/26/2009  . Pre-diabetes [R73.03] 12/15/2008  . DEPRESSION, MAJOR [F32.9] 05/17/2008  .  ESSENTIAL HYPERTENSION, BENIGN [I10] 05/17/2008  . HYPERCHOLESTEROLEMIA [E78.00] 11/05/2006  . BIPOLAR DISORDER [F31.9] 11/05/2006    Total Time spent with patient: 1 hour  Subjective:   Janice Ramos is a 77 y.o. female patient admitted with Xanax overdose.  HPI:   Per chart review, patient was admitted with Xanax overdose. She took 40 Xanax 1 mg tablets and wrote a suicide note. She presented to the hospital in a stuporous state and hypotensive. She has a history of depression and anxiety. Home medications include Effexor 300 mg daily, Atarax 10 mg TID PRN, Xanax 1 mg QID PRN, Atarax 10 mg TID PRN, Remeron 30 mg qhs and Trazodone 50 mg qhs. PMP indicates regularly filled prescriptions for Xanax 1 mg QID (last filled on 5/10) and Ritalin 20 mg BID (last filled on 5/10). Her medications are prescribed by Janice Ramos. BAL was negative and UDS was positive for benzodiazepines on admission.   Of note, she was last seen at [facility] in 08/2017 for Xanax ingestion in the setting of alcohol use. She denied SI or suicide attempt. Her daughter denied concerns for her safety. She was discharged with a plan to follow up with her outpatient provider.   On interview, Janice Ramos reports, "I screwed up. I didn't do the job good" when asked about her recent suicide attempt. She reports, "there is nothing left" when referring to her life. She responds, "no answer" when asked about ongoing SI. She denies HI or AVH. She reports a history of depression since she was 77 y/o. She was abandoned by her mother at 26-4 y/o and raised by her father who appeared to have not been emotionally present at times. She reports that her father was married several times.She reports chronic SI. She planned her suicide attempt  for 3 weeks. She is unsure what caused her to act on it now. She does reports multiple stressors including that she is temporarily living in a hotel while her home is renovated and the loss of her cat of 13 years. She  endorses feelings of hopelessness. She denies problems with sleep or appetite. She denies a history of manic symptoms (decreased need for sleep, increased energy or pressured speech).  Patient provided verbal consent to provide information to Janice Ramos [phone]) who is the resident services counselor of the [employer]. She was present at the hospital with a colleague to visit the patient. She provided this notewriter with additional information regarding her suicide attempt. She provided photos of the current state of her hotel room. It appeared disorganized with multiple items such as trash on the floor as well as soiled diapers. She had pictures of her children and a "[place] man" from her neighborhood on the [employer] She left a suicide note on her bed. It mentioned that she wanted her art work to be well kept. She reportedly mentioned that she wanted to kill her daughter prior to ending her life. She has a strained relationship with her daughter.     Past Psychiatric History: Depression, anxiety and bipolar disorder.   Risk to Self: Yes given recent suicide attempt and ongoing SI. Risk to Others:  None. Denies HI. Prior Inpatient Therapy:  She was hospitalized multiple times and has a history of multiple suicide attempts. She reports attempting to drown herself at 77 y/o.  Prior Outpatient Therapy:  She is followed by Janice Ramos, APMHNP-BC.  Past Medical History:  Past Medical History:  Diagnosis Date  . Allergy   . Bipolar 1 disorder ([place])   . Depression   . Diabetes mellitus   . Genital herpes in women 1983  . Hyperlipidemia   . Irritable bowel syndrome (IBS)   . Overdose of benzodiazepine 06/2005   Xanax  . Sinus disorder     Past Surgical History:  Procedure Laterality Date  . CATARACT EXTRACTION    . Cataract Surgery     L eye 12/2008, R eye 05/2009 [place]    . TUBAL LIGATION     Family History:  Family History   Problem Relation Age of Onset  . Cancer Mother        Bone  . Suicidality Father    Family Psychiatric  History: Father-depression and mother-BPAD.  Social History:  Social History   Substance and Sexual Activity  Alcohol Use Yes  . Alcohol/week: 0.0 oz   Comment: 1 x per year     Social History   Substance and Sexual Activity  Drug Use No    Social History   Socioeconomic History  . Marital status: Divorced    Spouse name: Not on file  . Number of children: 2  . Years of education: Not on file  . Highest education level: Not on file  Occupational History  . Occupation: Retired    Comment: Cabin crew  . Financial resource strain: Not on file  . Food insecurity:    Worry: Not on file    Inability: Not on file  . Transportation needs:    Medical: Not on file    Non-medical: Not on file  Tobacco Use  . Smoking status: Never Smoker  . Smokeless tobacco: Never Used  Substance and Sexual Activity  . Alcohol use: Yes    Alcohol/week: 0.0  oz    Comment: 1 x per year  . Drug use: No  . Sexual activity: Not on file  Lifestyle  . Physical activity:    Days per week: Not on file    Minutes per session: Not on file  . Stress: Not on file  Relationships  . Social connections:    Talks on phone: Not on file    Gets together: Not on file    Attends religious service: Not on file    Active member of club or organization: Not on file    Attends meetings of clubs or organizations: Not on file    Relationship status: Not on file  Other Topics Concern  . Not on file  Social History Narrative   Occassional EtOH.  Never smoked.  Denies drugs.     OD's on xanax in 2006.  Has 2 cats.  Is an Training and development officer.     Linus Mako and stretches for exercises.     Single - divorced 12 years ago.   severe psychiatric impairments      Health Care POA:    Emergency Contact: daughter, Victorio Palm, [phone]   End of Life Plan:    Who lives with you: self   Any pets: 2 cats    Diet: Patient focuses on eating vegetables and states she might have a gluten intolerance.   Exercise: Patient reports moving all day long   Seatbelts: Patient reports wearing seatbelt when in vehicle.   Nancy Fetter Exposure/Protection: Patient reports wearing sun screen daily.    Hobbies: Art-pastels, biking, cars, reading         Additional Social History: She lives alone. She is temporarily living in a hotel while her home is renovated. She is divorced x 4 times. She married the same man twice. She has a son and daughter. 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 She attends church. She denies illicit substance or alcohol use.     Allergies:   Allergies  Allergen Reactions  . Latex     Labs:  Results for orders placed or performed during the hospital encounter of 01/22/18 (from the past 48 hour(s))  Comprehensive metabolic panel     Status: Abnormal   Collection Time: 01/22/18  1:24 AM  Result Value Ref Range   Sodium 132 (L) 135 - 145 mmol/L   Potassium 3.2 (L) 3.5 - 5.1 mmol/L   Chloride 98 (L) 101 - 111 mmol/L   CO2 26 22 - 32 mmol/L   Glucose, Bld 92 65 - 99 mg/dL   BUN 15 6 - 20 mg/dL   Creatinine, Ser 0.92 0.44 - 1.00 mg/dL   Calcium 8.4 (L) 8.9 - 10.3 mg/dL   Total Protein 6.1 (L) 6.5 - 8.1 g/dL   Albumin 3.4 (L) 3.5 - 5.0 g/dL   AST 28 15 - 41 U/L   ALT 18 14 - 54 U/L   Alkaline Phosphatase 59 38 - 126 U/L   Total Bilirubin 0.5 0.3 - 1.2 mg/dL   GFR calc non Af Amer 59 (L) >60 mL/min   GFR calc Af Amer >60 >60 mL/min    Comment: (NOTE) The eGFR has been calculated using the CKD EPI equation. This calculation has not been validated in all clinical situations. eGFR's persistently <60 mL/min signify possible Chronic Kidney Disease.    Anion gap 8 5 - 15    Comment: Performed at [facility] [address] [place], [place] [address]  cbc  Status: Abnormal   Collection Time: 01/22/18  1:24 AM  Result Value Ref Range   WBC 8.7 4.0 - 10.5 K/uL   RBC  3.72 (L) 3.87 - 5.11 MIL/uL   Hemoglobin 11.5 (L) 12.0 - 15.0 g/dL   HCT 34.6 (L) 36.0 - 46.0 %   MCV 93.0 78.0 - 100.0 fL   MCH 30.9 26.0 - 34.0 pg   MCHC 33.2 30.0 - 36.0 g/dL   RDW 12.6 11.5 - 15.5 %   Platelets 392 150 - 400 K/uL    Comment: Performed at [facility], [place] [address] [place], [place] [address]  Rapid urine drug screen (hospital performed)     Status: Abnormal   Collection Time: 01/22/18  1:24 AM  Result Value Ref Range   Opiates NONE DETECTED NONE DETECTED   Cocaine NONE DETECTED NONE DETECTED   Benzodiazepines POSITIVE (A) NONE DETECTED   Amphetamines NONE DETECTED NONE DETECTED   Tetrahydrocannabinol NONE DETECTED NONE DETECTED   Barbiturates NONE DETECTED NONE DETECTED    Comment: (NOTE) DRUG SCREEN FOR MEDICAL PURPOSES ONLY.  IF CONFIRMATION IS NEEDED FOR ANY PURPOSE, NOTIFY LAB WITHIN 5 DAYS. LOWEST DETECTABLE LIMITS FOR URINE DRUG SCREEN Drug Class                     Cutoff (ng/mL) Amphetamine and metabolites    1000 Barbiturate and metabolites    200 Benzodiazepine                 751 Tricyclics and metabolites     300 Opiates and metabolites        300 Cocaine and metabolites        300 THC                            50 Performed at [facility], [place] [address], [place], [place] [address]   Urinalysis, Routine w reflex microscopic     Status: Abnormal   Collection Time: 01/22/18  3:23 AM  Result Value Ref Range   Color, Urine STRAW (A) YELLOW   APPearance CLEAR CLEAR   Specific Gravity, Urine 1.002 (L) 1.005 - 1.030   pH 7.0 5.0 - 8.0   Glucose, UA NEGATIVE NEGATIVE mg/dL   Hgb urine dipstick LARGE (A) NEGATIVE   Bilirubin Urine NEGATIVE NEGATIVE   Ketones, ur NEGATIVE NEGATIVE mg/dL   Protein, ur NEGATIVE NEGATIVE mg/dL   Nitrite NEGATIVE NEGATIVE   Leukocytes, UA LARGE (A) NEGATIVE   RBC / HPF 0-5 0 - 5 RBC/hpf   WBC, UA 6-10 0 - 5 WBC/hpf   Bacteria, UA FEW (A) NONE SEEN   Squamous Epithelial / LPF 0-5 0 - 5     Comment: Performed at [facility], [place] [address], [place], [place] [address]  Ethanol     Status: None   Collection Time: 01/22/18  4:33 AM  Result Value Ref Range   Alcohol, Ethyl (B) <10 <10 mg/dL    Comment: (NOTE) Lowest detectable limit for serum alcohol is 10 mg/dL. For medical purposes only. Performed at [facility], [place] [address] [place],  [address]   Salicylate level     Status: None   Collection Time: 01/22/18  4:33 AM  Result Value Ref Range   Salicylate Lvl <6.3 2.8 - 30.0 mg/dL    Comment: Performed at [facility] [address], [place], [place]  [address]  Acetaminophen level     Status: Abnormal   Collection Time: 01/22/18  4:33 AM  Result Value Ref Range   Acetaminophen (Tylenol), Serum <10 (L) 10 - 30 ug/mL    Comment: (NOTE) Therapeutic concentrations vary significantly. A range of 10-30 ug/mL  may be an effective concentration for many patients. However, some  are best treated at concentrations outside of this range. Acetaminophen concentrations >150 ug/mL at 4 hours after ingestion  and >50 ug/mL at 12 hours after ingestion are often associated with  toxic reactions. Performed at  Hospital Lab, [place] [address] [place], [place] [address]     Current Facility-Administered Medications  Medication Dose Route Frequency Provider Last Rate Last Dose  . 0.9 %  sodium chloride infusion   Intravenous Continuous Steve Rattler, DO 125 mL/hr at 01/22/18 0650    . enoxaparin (LOVENOX) injection 40 mg  40 mg Subcutaneous Q24H Riccio, Angela C, DO       Current Outpatient Medications  Medication Sig Dispense Refill  . ALPRAZolam (XANAX) 1 MG tablet Take 1 mg by mouth 3 (three) times daily as needed for anxiety. Can take an additional tablet at bedtime for sleep.  1  . hydrochlorothiazide (HYDRODIURIL) 25 MG tablet TAKE 1 TABLET BY MOUTH EVERY DAY 90 tablet 3  . lisinopril (PRINIVIL,ZESTRIL) 10 MG tablet TAKE 1 TABLET BY MOUTH EVERY  DAY 90 tablet 3  . methylphenidate (RITALIN) 20 MG tablet Take 20 mg by mouth 2 (two) times daily.  0  . mirtazapine (REMERON) 30 MG tablet TAKE 1 TABLET BY MOUTH EVERYDAY AT BEDTIME  0  . valACYclovir (VALTREX) 1000 MG tablet Take 500 mg by mouth 2 (two) times daily.  4  . venlafaxine (EFFEXOR-XR) 150 MG 24 hr capsule Take 300 mg by mouth daily. Per [facility] on [employer]     . vitamin C (ASCORBIC ACID) 500 MG tablet Take 500 mg by mouth daily.    Marland Kitchen acyclovir (ZOVIRAX) 400 MG tablet Take 1 tablet (400 mg total) 5 (five) times daily by mouth. (Patient not taking: Reported on 01/22/2018) 30 tablet 0  . erythromycin ophthalmic ointment Place a 1/2 inch ribbon of ointment into the lower eyelid. (Patient not taking: Reported on 01/22/2018) 1 g 0  . hydrOXYzine (ATARAX/VISTARIL) 10 MG tablet Take 1 tablet (10 mg total) by mouth 3 (three) times daily as needed. (Patient not taking: Reported on 01/22/2018) 60 tablet 2  . tobramycin (TOBREX) 0.3 % ophthalmic solution Place 1 drop into both eyes every 6 (six) hours. (Patient not taking: Reported on 01/22/2018) 5 mL 0  . traZODone (DESYREL) 50 MG tablet Take 50 mg by mouth at bedtime. Per [place]     . valACYclovir (VALTREX) 500 MG tablet Take 1 tablet (500 mg total) by mouth 2 (two) times daily. (Patient not taking: Reported on 01/22/2018) 6 tablet 10    Musculoskeletal: Strength & Muscle Tone: decreased due to physical deconditioning.  Gait & Station: UTA due to patient lying in bed. Patient leans: N/A  Psychiatric Specialty Exam: Physical Exam  Nursing note and vitals reviewed. Constitutional: She is oriented to person, place, and time. She appears well-developed and well-nourished.  HENT:  Head: Normocephalic and atraumatic.  Neck: Normal range of motion.  Respiratory: Effort normal.  Musculoskeletal: Normal range of motion.  Neurological: She is alert and oriented to person, place, and time.  Skin: No rash noted.   Psychiatric: Her speech is normal and behavior is  normal. Thought content normal. Cognition and memory are normal. She expresses impulsivity. She exhibits a depressed mood.    Review of Systems  Constitutional: Negative for chills and fever.  Gastrointestinal: Positive for abdominal pain. Negative for constipation, diarrhea, nausea and vomiting.  Genitourinary:       Urinary retention   Psychiatric/Behavioral: Positive for depression and suicidal ideas. Negative for hallucinations and substance abuse. The patient does not have insomnia.     Blood pressure 115/71, pulse (!) 59, temperature (!) 97 F (36.1 C), temperature source Temporal, resp. rate 17, SpO2 99 %.There is no height or weight on file to calculate BMI.  General Appearance: Fairly Groomed, elderly, Caucasian female, wearing a hospital gown with long, gray hair and eyes closed who is lying in bed. NAD.   Eye Contact:  Minimal  Speech:  Clear and Coherent and Normal Rate  Volume:  Normal  Mood:  Depressed  Affect:  Congruent and Tearful  Thought Process:  Goal Directed, Linear and Descriptions of Associations: Intact  Orientation:  Full (Time, Place, and Person)  Thought Content:  Logical  Suicidal Thoughts:  Yes.  with intent/plan  Homicidal Thoughts:  No  Memory:  Immediate;   Good Recent;   Good Remote;   Good  Judgement:  Fair  Insight:  Fair  Psychomotor Activity:  Normal and Decreased  Concentration:  Concentration: Fair and Attention Span: Fair  Recall:  Good  Fund of Knowledge:  Good  Language:  Good  Akathisia:  No  Handed:  Right  AIMS (if indicated):   N/A  Assets:  Communication Skills Housing Social Support Talents/Skills  ADL's:  Impaired  Cognition:  WNL  Sleep:   Okay   Assessment: JALANA MOORE is a 77 y.o. female who was admitted for suicide attempt by Xanax overdose in the setting of multiple psychosocial stressors including isolation (temporarily living in hotel while house is being  renovated) and loss of her cat. She endorses SI and regrets that her attempt was not successful as well as feelings of hopelessness. She warrants inpatient psychiatric hospitalization for stabilization and treatment.   Treatment Plan Summary: -Patient warrants inpatient psychiatric hospitalization given high risk of harm to self. -Continue bedside sitter.  -Continue to hold home psychiatric medications until patient is more alert and at her baseline. Agree with tapering Xanax given risk for lethality in overdose. Place patient on CIWA to monitor for withdrawal.  -Please pursue involuntary commitment if patient refuses voluntary psychiatric hospitalization or attempts to leave the hospital.  -Will sign off on patient at this time. Please consult psychiatry again as needed.    Disposition: Recommend psychiatric Inpatient admission when medically cleared.  Faythe Dingwall, DO 01/22/2018 11:57 AM

## 2018-01-22 NOTE — ED Notes (Signed)
Pt's BP 88/51, decreased LOC, difficult to arouse. Dr. Blinda Leatherwood at the bedside.

## 2018-01-22 NOTE — ED Notes (Signed)
Admitting MD at bedside.

## 2018-01-23 ENCOUNTER — Other Ambulatory Visit: Payer: Self-pay

## 2018-01-23 LAB — CBC
HCT: 39.3 % (ref 36.0–46.0)
Hemoglobin: 13.1 g/dL (ref 12.0–15.0)
MCH: 31.3 pg (ref 26.0–34.0)
MCHC: 33.3 g/dL (ref 30.0–36.0)
MCV: 93.8 fL (ref 78.0–100.0)
PLATELETS: 420 10*3/uL — AB (ref 150–400)
RBC: 4.19 MIL/uL (ref 3.87–5.11)
RDW: 13 % (ref 11.5–15.5)
WBC: 6.4 10*3/uL (ref 4.0–10.5)

## 2018-01-23 LAB — BASIC METABOLIC PANEL
Anion gap: 7 (ref 5–15)
BUN: 17 mg/dL (ref 6–20)
CALCIUM: 8.5 mg/dL — AB (ref 8.9–10.3)
CO2: 24 mmol/L (ref 22–32)
CREATININE: 0.84 mg/dL (ref 0.44–1.00)
Chloride: 107 mmol/L (ref 101–111)
GFR calc Af Amer: >60 mL/min (ref >60)
GLUCOSE: 98 mg/dL (ref 65–99)
Potassium: 4.3 mmol/L (ref 3.5–5.1)
SODIUM: 138 mmol/L (ref 135–145)

## 2018-01-23 MED ORDER — CLONAZEPAM 0.5 MG PO TABS
0.5000 mg | ORAL_TABLET | Freq: Two times a day (BID) | ORAL | Status: DC
  Administered 2018-01-23 – 2018-01-25 (×4): 0.5 mg via ORAL
  Filled 2018-01-23 (×4): qty 1

## 2018-01-23 MED ORDER — VALACYCLOVIR HCL 500 MG PO TABS
500.0000 mg | ORAL_TABLET | Freq: Two times a day (BID) | ORAL | Status: DC
  Administered 2018-01-23 – 2018-01-26 (×6): 500 mg via ORAL
  Filled 2018-01-23 (×7): qty 1

## 2018-01-23 MED ORDER — DOCUSATE SODIUM 100 MG PO CAPS: 100.0000 mg | ORAL_CAPSULE | Freq: Two times a day (BID) | ORAL | Status: DC | PRN

## 2018-01-23 MED ORDER — POLYETHYLENE GLYCOL 3350 17 G PO PACK
17.0000 g | PACK | Freq: Every day | ORAL | Status: DC
  Administered 2018-01-26: 17 g via ORAL
  Filled 2018-01-23 (×3): qty 1

## 2018-01-23 NOTE — Progress Notes (Signed)
Patient was asking if her daughter was aware of her being in the hospital.  Explained that she can contact her daughter herself and inform her of where she is.  Tech allowed patient to use her cell phone.

## 2018-01-23 NOTE — ED Notes (Signed)
Pt asking for RN to call Miki Kins and Tommi Rumps at [facility], [place] - for her. RN attempted to call - no answer - [phone].

## 2018-01-23 NOTE — ED Notes (Signed)
Pt noted to be sleeping. No sweating noted - skin warm and dry to the touch.

## 2018-01-23 NOTE — ED Notes (Signed)
Monitor remains intact to pt. Pt assisted to more comfortable position on bed. Bed changed out w/stretcher earlier for comfort.

## 2018-01-23 NOTE — Progress Notes (Signed)
Family Medicine Teaching Service Daily Progress Note Intern Pager: [phone]  Patient name: Janice Ramos Medical record number: [id number] Date of birth: [birth year] Age: 77 y.o. Gender: female  Primary Care Provider: Marquette Saa, MD Consultants: CCM, Psych  Code Status: FULL   Pt Overview and Major Events to Date:  5/17: Patient admitted for intentional BZD OD, evaluated by psych who states patient warrants inpatient psychiatric admission   Assessment and Plan: Assessment and Plan: Janice Ramos is a 77 y.o. female presenting with intentional BZD overdose. 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  Intentional benzodiazepine overdose- patient chronically on xanax for anxiety, she has 1 mg pills at home and states she took the whole bottle with intention of suicide.  IVC paperwork completed by ED provider. Patient evaluated by psychiatry; warrants inpatient psychiatric admission given high risk of harm to seld.  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 She also has erythromycin eye ointment and tobramycin eye drops on med list which appear to have been prescribed last year. No signs of active infection on exam. -hold antivirals -consider eye drops if pain is problem -may need to discuss with ID if patient needs to be on chronic suppression as she has seen them in the past  Depression/bipolar disorder- effexor 300 mg every day, hydroxyzine 10 mg TID as needed, xanax 0.5 mg every night as needed, remeron 30 mg every night, and trazodone 50 mg every night on home med list -hold all psych meds -c/s  psych as above  HTN- currently hypotensive, lisinopril 10 mg and HCTZ 25 mg on home med list -hold home meds   FEN/GI: Regular diet  Prophylaxis: lovenox  Disposition: Will need inpatient psychiatric placement after this hospitalization   Subjective:  Patient reports she's "not her best" in reference to her overall state and mood. Does endorse some constipation.   Objective: Temp:  [97.5 F (36.4 C)-98.2 F (36.8 C)] 98.2 F (36.8 C) (05/18 0605) Pulse Rate:  [58-68] 66 (05/18 0605) Resp:  [15-22] 21 (05/18 0605) BP: (89-132)/(57-76) 123/66 (05/18 0605) SpO2:  [95 %-100 %] 100 % (05/18 1478) Physical Exam: General: lying in bed, NAD, sitter at bedside  Cardiovascular: RRR. No murmurs present. No LE edema.  Respiratory: Lungs CTAB. Normal WOB.  Abdomen: soft, NTND  Neuro: Alert and oriented x3.  Psych: Flat affect. Depressed mood.   Laboratory: Recent Labs  Lab 01/22/18 0124 01/23/18 0516  WBC 8.7 6.4  HGB 11.5* 13.1  HCT 34.6* 39.3  PLT 392 420*   Recent Labs  Lab 01/22/18 0124 01/23/18 0516  NA 132* 138  K 3.2* 4.3  CL 98* 107  CO2 26 24  BUN 15 17  CREATININE 0.92 0.84  CALCIUM 8.4* 8.5*  PROT 6.1*  --   BILITOT 0.5  --   ALKPHOS 59  --   ALT 18  --   AST 28  --   GLUCOSE 92 98     Imaging/Diagnostic Tests: Dg Shoulder Right Portable  Result Date: 01/22/2018 CLINICAL DATA:  Fall, crepitus and deformity. EXAM: PORTABLE RIGHT SHOULDER COMPARISON:  None. FINDINGS: Single view of the RIGHT shoulder is provided. No fracture line or displaced fracture fragment appreciated. Humeral head is grossly well positioned relative to the glenoid fossa. Overlying acromioclavicular joint space is normally aligned. Soft tissues about the RIGHT shoulder are unremarkable. IMPRESSION: No osseous fracture or dislocation appreciated on this single AP view of the RIGHT shoulder. Electronically Signed   By: Bary Richard M.D.   On: 01/22/2018 18:44    Arvilla Market, DO 01/23/2018, 9:24 AM PGY-3, [facility] FPTS Intern pager: [phone], text pages welcome

## 2018-01-23 NOTE — ED Notes (Signed)
Admitting in w/pt.

## 2018-01-23 NOTE — ED Notes (Signed)
Attempted to call report. RN to call back. 

## 2018-01-23 NOTE — Progress Notes (Signed)
Patient arrived to 6n27, A&Ox4, VSS, and RAC and LW IV intact.  No family at bedside.  Patient oriented to room and equipment.  Patient belongings placed in bottom cabinet outside of room.  Will continue to monitor.

## 2018-01-23 NOTE — ED Notes (Signed)
Pt's belongings - 1 labeled belongings bag w/inventory sheet, Valuables sheet, and home meds sheet taken w/pt to floor along w/pt's IVC paperwork.

## 2018-01-24 MED ORDER — VENLAFAXINE HCL ER 75 MG PO CP24
300.0000 mg | ORAL_CAPSULE | Freq: Every day | ORAL | Status: DC
  Administered 2018-01-24 – 2018-01-26 (×3): 300 mg via ORAL
  Filled 2018-01-24 (×3): qty 4

## 2018-01-24 MED ORDER — HYDROCHLOROTHIAZIDE 25 MG PO TABS
25.0000 mg | ORAL_TABLET | Freq: Every day | ORAL | Status: DC
  Administered 2018-01-24 – 2018-01-26 (×3): 25 mg via ORAL
  Filled 2018-01-24 (×3): qty 1

## 2018-01-24 MED ORDER — HYDROXYZINE HCL 10 MG PO TABS
10.0000 mg | ORAL_TABLET | Freq: Three times a day (TID) | ORAL | Status: DC | PRN
  Administered 2018-01-24: 10 mg via ORAL
  Filled 2018-01-24: qty 1

## 2018-01-24 MED ORDER — MIRTAZAPINE 30 MG PO TABS
30.0000 mg | ORAL_TABLET | Freq: Every day | ORAL | Status: DC
  Administered 2018-01-24 – 2018-01-25 (×2): 30 mg via ORAL
  Filled 2018-01-24 (×3): qty 1

## 2018-01-24 NOTE — Progress Notes (Signed)
Patient has been having moments of being very happily talkative to moments of yelling and crying spells.  She spoke with her daughter and her daughter stated that  she knew her mother was in the hospital but her mother refused to allow staff to give her information at that time.  Patient so far has been awake -Ambien unsuccessful and patient have been exhibiting attention seeking behaviors.

## 2018-01-24 NOTE — Social Work (Addendum)
Referral sent to [facility], paged MD regarding medical readiness for discharge.  1:55pm- CSW aware pt is medically stable for discharge. Per Dannielle Huh at [facility] disposition pt is more appropriate for a geriatric psych unit. Disposition will fax out pt clinicals to gero psych facilities.   4:10pm- CSW received call from [facility], they are at capacity and have declined pt.  Doy Hutching, LCSWA [facility] Clinical Social Work [phone]

## 2018-01-24 NOTE — BH Assessment (Signed)
BHH Assessment Progress Note   Patient has been deemed to be most appropriate for Gero-psych placement.  Her referral information has been faxed to the following facilities for review:  [facility]

## 2018-01-24 NOTE — Progress Notes (Signed)
Family Medicine Teaching Service Daily Progress Note Intern Pager: [phone]  Patient name: Janice Ramos Medical record number: [id number] Date of birth: 09-20-1940 Age: 77 y.o. Gender: female  Primary Care Provider: Marquette Saa, MD Consultants: CCM, Psych  Code Status: FULL   Pt Overview and Major Events to Date:  5/17: Patient admitted for intentional BZD OD, evaluated by psych who states patient warrants inpatient psychiatric admission   Assessment and Plan: Assessment and Plan: Janice Ramos is a 77 y.o. female presenting with intentional BZD overdose. 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  Intentional benzodiazepine overdose- patient chronically on xanax for anxiety, she has 1 mg pills at home and states she took the whole bottle with intention of suicide.  IVC paperwork completed by ED provider. Patient evaluated by psychiatry; warrants inpatient psychiatric admission given high risk of harm to seld.  -q1 hour neuro checks -suicide precautions, sitter ordered -CIWA to monitor for withdrawal: recent scores 3-4  -patient now alert, will start Klonopin 0.5 mg BID to avoid withdrawals   HSV keratitis, HSV1 and 2- chronic problems for patient, she has seen ID in past, acyclovir 400 mg five times daily as well as valtrex 500 mg BID on med list. She also has erythromycin eye ointment and tobramycin eye drops on med list which appear to have been prescribed last year. Patient reports she thinks she is having a current break out.  -Started Valtrex 500 mg BID for treatment dosing   Depression/bipolar disorder- effexor 300 mg every day, hydroxyzine 10 mg TID as needed, xanax 0.5 mg every night as needed, remeron 30 mg every night -re-started effexor, hydroxyzine prn, and remeron  -Ritalin noted on home med list as needing review, did not re-start this medication  -c/s psych  as above  HTN- Lisinopril 10 mg and HCTZ 25 mg on home med list. BP has been elevated overnight 150s systolic.  -re-start home HCTZ -re-start Lisinopril if BP remains elevated    FEN/GI: Regular diet  Prophylaxis: lovenox  Disposition: Will need inpatient psychiatric placement after this hospitalization   Subjective:  Patient quite upset this morning when I initially entered room. Says she didn't sleep one bit last night. Seems agitated.    Objective: Temp:  [97.5 F (36.4 C)-99.4 F (37.4 C)] 97.5 F (36.4 C) (05/19 0455) Pulse Rate:  [77-81] 77 (05/19 0455) Resp:  [16-20] 18 (05/19 0455) BP: (121-157)/(68-92) 157/92 (05/19 0455) SpO2:  [98 %-100 %] 99 % (05/19 0455) Physical Exam: General: lying in bed, NAD, sitter at bedside  Cardiovascular: RRR. No murmurs present. No LE edema.  Respiratory: Lungs CTAB. Normal WOB.  Abdomen: soft, NTND  Neuro: Alert and oriented x3.  Psych: Agitated. Somewhat hostile.   Laboratory: Recent Labs  Lab 01/22/18 0124 01/23/18 0516  WBC 8.7 6.4  HGB 11.5* 13.1  HCT 34.6* 39.3  PLT 392 420*   Recent Labs  Lab 01/22/18 0124 01/23/18 0516  NA 132* 138  K 3.2* 4.3  CL 98* 107  CO2 26 24  BUN 15 17  CREATININE 0.92 0.84  CALCIUM 8.4* 8.5*  PROT 6.1*  --   BILITOT 0.5  --   ALKPHOS 59  --   ALT 18  --   AST 28  --   GLUCOSE 92 98     Imaging/Diagnostic Tests: Dg Shoulder Right Portable  Result Date: 01/22/2018 CLINICAL DATA:  Fall,  crepitus and deformity. EXAM: PORTABLE RIGHT SHOULDER COMPARISON:  None. FINDINGS: Single view of the RIGHT shoulder is provided. No fracture line or displaced fracture fragment appreciated. Humeral head is grossly well positioned relative to the glenoid fossa. Overlying acromioclavicular joint space is normally aligned. Soft tissues about the RIGHT shoulder are unremarkable. IMPRESSION: No osseous fracture or dislocation appreciated on this single AP view of the RIGHT shoulder. Electronically  Signed   By: Bary Richard M.D.   On: 01/22/2018 18:44    Arvilla Market, DO 01/24/2018, 8:18 AM PGY-3, [facility] FPTS Intern pager: [phone], text pages welcome

## 2018-01-25 ENCOUNTER — Inpatient Hospital Stay (HOSPITAL_COMMUNITY): Payer: Medicare Other

## 2018-01-25 MED ORDER — CLONAZEPAM 0.5 MG PO TABS: 0.2500 mg | ORAL_TABLET | Freq: Two times a day (BID) | ORAL | Status: DC

## 2018-01-25 MED ORDER — LISINOPRIL 10 MG PO TABS
10.0000 mg | ORAL_TABLET | Freq: Every day | ORAL | Status: DC
  Administered 2018-01-25 – 2018-01-26 (×2): 10 mg via ORAL
  Filled 2018-01-25 (×2): qty 1

## 2018-01-25 MED ORDER — CLONAZEPAM 0.5 MG PO TABS
0.2500 mg | ORAL_TABLET | Freq: Two times a day (BID) | ORAL | Status: DC
  Administered 2018-01-25 – 2018-01-26 (×2): 0.25 mg via ORAL
  Filled 2018-01-25 (×2): qty 1

## 2018-01-25 NOTE — Discharge Summary (Addendum)
Family Medicine Teaching [facility] Discharge Summary  Patient name: Janice Ramos Medical record number: [id number] Date of birth: Aug 26, 1941 Age: 77 y.o. Gender: female Date of Admission: 01/22/2018  Date of Discharge: 01/26/2018 Admitting Physician: Moses Manners, MD  Primary Care Provider: Marquette Saa, MD Consultants: psychiatry  Indication for Hospitalization: overdose attempt  Discharge Diagnoses/Problem List:  Intentional Benzo Overdose HSV keratitis Depression/Bipolar disorder Hypertension  Disposition: Inpatient Geri/psych  Discharge Condition: stable  Discharge Exam: General: lying in bed, No acute Distress, sitter at bedside  Cardiovascular: Regular Rate Rhythm. No murmurs present. No LE edema.  Respiratory: Lungs CTAB. Normal WOB.  Abdomen: soft, NTND  Neuro: Alert and oriented x3.  Psych: pleasant this am, appropriate  Brief Hospital Course:  77 year old female who presented on 5/17 after an intentional overdose attempt of xanax.  Benzo Overdose Patient initially hypotensive and somnolent on arrival. Given fluids in ED which did help correct vital sign abnormalities. Over the course of 5/17 and 5/18 xanax did wear off and patient returned to baseline. Started klonopin 0.5mg  bid on 5/18 to prevent withdrawal given her chronic opiate use. She was tapered to a 0.25mg  bid dosage on 5/20. She tolerated this well. CIWA scores were between 0-2 last 24 hours of aadmission. Patient evaluated by psychiatry on 5/17 who recommended IVC and inpatient psych placement. Patient was discharged to inpatient geri/psych on 01/26/2018.  Depression Outpatient depression meds initially held until effects of xanax ingestion resolved. Restarted her home effexor, hydroxyzine, remeron on 5/18.  HSV keratitis Has been seen by ID in the past. Patinet was started on her home regimen of valtrex  bid prn when having an outbreak after admission. Will need to follow up  with pcp after discharge to determine appropriate therapy in conjunction with ID.  Issues for Follow Up:  1. Being discharged to inpatient psych facility 2. Follow up with pcp for hsv keratitis managment  Significant Procedures:  Significant Labs and Imaging:  Recent Labs  Lab 01/22/18 0124 01/23/18 0516  WBC 8.7 6.4  HGB 11.5* 13.1  HCT 34.6* 39.3  PLT 392 420*   Recent Labs  Lab 01/22/18 0124 01/23/18 0516  NA 132* 138  K 3.2* 4.3  CL 98* 107  CO2 26 24  GLUCOSE 92 98  BUN 15 17  CREATININE 0.92 0.84  CALCIUM 8.4* 8.5*  ALKPHOS 59  --   AST 28  --   ALT 18  --   ALBUMIN 3.4*  --     Results/Tests Pending at Time of Discharge:  Discharge Medications:  Allergies as of 01/26/2018      Reactions   Latex       Medication List    TAKE these medications   acyclovir 400 MG tablet Commonly known as:  ZOVIRAX Take 1 tablet (400 mg total) 5 (five) times daily by mouth.   ALPRAZolam 1 MG tablet Commonly known as:  XANAX Take 1 mg by mouth 3 (three) times daily as needed for anxiety. Can take an additional tablet at bedtime for sleep.   clonazePAM 0.5 MG tablet Commonly known as:  KLONOPIN Take 0.5 tablets (0.25 mg total) by mouth 2 (two) times daily.   erythromycin ophthalmic ointment Place a 1/2 inch ribbon of ointment into the lower eyelid.   hydrochlorothiazide 25 MG tablet Commonly known as:  HYDRODIURIL TAKE 1 TABLET BY MOUTH EVERY DAY   hydrOXYzine 10 MG tablet Commonly known as:  ATARAX/VISTARIL Take 1 tablet (10 mg total) by  mouth 3 (three) times daily as needed.   lisinopril 10 MG tablet Commonly known as:  PRINIVIL,ZESTRIL TAKE 1 TABLET BY MOUTH EVERY DAY   methylphenidate 20 MG tablet Commonly known as:  RITALIN Take 20 mg by mouth 2 (two) times daily.   mirtazapine 30 MG tablet Commonly known as:  REMERON TAKE 1 TABLET BY MOUTH EVERYDAY AT BEDTIME What changed:  Another medication with the same name was added. Make sure you understand  how and when to take each.   mirtazapine 30 MG tablet Commonly known as:  REMERON Take 1 tablet (30 mg total) by mouth at bedtime. What changed:  You were already taking a medication with the same name, and this prescription was added. Make sure you understand how and when to take each.   tobramycin 0.3 % ophthalmic solution Commonly known as:  TOBREX Place 1 drop into both eyes every 6 (six) hours.   traZODone 50 MG tablet Commonly known as:  DESYREL Take 50 mg by mouth at bedtime. Per [facility]   valACYclovir 500 MG tablet Commonly known as:  VALTREX Take 1 tablet (500 mg total) by mouth 2 (two) times daily. What changed:  Another medication with the same name was changed. Make sure you understand how and when to take each.   valACYclovir 500 MG tablet Commonly known as:  VALTREX Take 1 tablet (500 mg total) by mouth 2 (two) times daily. What changed:  medication strength   venlafaxine XR 150 MG 24 hr capsule Commonly known as:  EFFEXOR-XR Take 300 mg by mouth daily. Per [facility] [address] What changed:  Another medication with the same name was added. Make sure you understand how and when to take each.   venlafaxine XR 150 MG 24 hr capsule Commonly known as:  EFFEXOR-XR Take 2 capsules (300 mg total) by mouth daily. What changed:  You were already taking a medication with the same name, and this prescription was added. Make sure you understand how and when to take each.   vitamin C 500 MG tablet Commonly known as:  ASCORBIC ACID Take 500 mg by mouth daily.   zolpidem 5 MG tablet Commonly known as:  AMBIEN Take 1 tablet (5 mg total) by mouth at bedtime as needed for sleep.       Discharge Instructions: Please refer to Patient Instructions section of EMR for full details.  Patient was counseled important signs and symptoms that should prompt return to medical care, changes in medications, dietary instructions, activity restrictions, and  follow up appointments.   Follow-Up Appointments: Please schedule an appointment with    [name], MD 01/26/2018, 8:20 AM PGY-1, [facility] Health Family Medicine

## 2018-01-25 NOTE — Progress Notes (Signed)
Family Medicine Teaching Service Daily Progress Note Intern Pager: [phone]  Patient name: Janice Ramos Medical record number: [id number] Date of birth: Jan 28, 1941 Age: 77 y.o. Gender: female  Primary Care Provider: Marquette Saa, MD Consultants: CCM, Psych  Code Status: FULL   Pt Overview and Major Events to Date:  5/17: Patient admitted for intentional BZD OD, evaluated by psych who states patient warrants inpatient psychiatric admission   Assessment and Plan: Assessment and Plan: Janice Ramos is a 77 y.o. female presenting with intentional BZD overdose. 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  Intentional benzodiazepine overdose- patient chronically on xanax for anxiety, she has 1 mg pills at home and states she took the whole bottle with intention of suicide.  IVC paperwork completed by ED provider. Patient evaluated by psychiatry; warrants inpatient psychiatric admission given high risk of harm to self. Medically ready for Discharge to inpatient psych. -q4 hour neuro checks -suicide precautions, sitter ordered -CIWA to monitor for withdrawal: recent scores 0-2  -patient now alert, continue Klonopin 0.5 mg BID to avoid withdrawals   HSV keratitis, HSV1 and 2- chronic problems for patient, she has seen ID in past, acyclovir 400 mg five times daily as well as valtrex 500 mg BID on med list. She also has erythromycin eye ointment and tobramycin eye drops on med list which appear to have been prescribed last year. Patient reports she thinks she is having a current break out.  -continue Valtrex 500 mg BID for treatment dosing   Depression/bipolar disorder- effexor 300 mg every day, hydroxyzine 10 mg TID as needed, xanax 0.5 mg every night as needed, remeron 30 mg every night -continue effexo, hydroxyzine prn, and remeron  -continuing to hold ritalin -c/s psych as  above  HTN- Lisinopril 10 mg and HCTZ 25 mg on home med list. BP has been elevated overnight 150s systolic.  -re-start home HCTZ  -re-started home lisnopril   FEN/GI: Regular diet  Prophylaxis: lovenox  Disposition: Will need inpatient psychiatric placement after this hospitalization   Subjective: Doing well this morning. In good spirits because she was finally able to get sleep.   Objective: Temp:  [98 F (36.7 C)-98.2 F (36.8 C)] 98.1 F (36.7 C) (05/20 0527) Pulse Rate:  [63-71] 71 (05/20 0527) Resp:  [18-20] 18 (05/20 0527) BP: (122-150)/(71-86) 150/86 (05/20 0527) SpO2:  [95 %-98 %] 97 % (05/20 0527) Physical Exam: General: lying in bed, No acute Distress, sitter at bedside  Cardiovascular: Regular Rate Rhythm. No murmurs present. No LE edema.  Respiratory: Lungs CTAB. Normal WOB.  Abdomen: soft, NTND  Neuro: Alert and oriented x3.  Psych: pleasant this am, appropriate  Laboratory: Recent Labs  Lab 01/22/18 0124 01/23/18 0516  WBC 8.7 6.4  HGB 11.5* 13.1  HCT 34.6* 39.3  PLT 392 420*   Recent Labs  Lab 01/22/18 0124 01/23/18 0516  NA 132* 138  K 3.2* 4.3  CL 98* 107  CO2 26 24  BUN 15 17  CREATININE 0.92 0.84  CALCIUM 8.4* 8.5*  PROT 6.1*  --   BILITOT 0.5  --   ALKPHOS 59  --   ALT 18  --   AST 28  --   GLUCOSE 92 98     Imaging/Diagnostic Tests: Dg Shoulder Right Portable  Result Date: 01/22/2018 CLINICAL DATA:  Fall, crepitus and deformity. EXAM: PORTABLE RIGHT SHOULDER COMPARISON:  None. FINDINGS: Single  view of the RIGHT shoulder is provided. No fracture line or displaced fracture fragment appreciated. Humeral head is grossly well positioned relative to the glenoid fossa. Overlying acromioclavicular joint space is normally aligned. Soft tissues about the RIGHT shoulder are unremarkable. IMPRESSION: No osseous fracture or dislocation appreciated on this single AP view of the RIGHT shoulder. Electronically Signed   By: Bary Richard  M.D.   On: 01/22/2018 18:44    [name], MD 01/25/2018, 7:04 AM PGY-1, [facility] Health Family Medicine FPTS Intern pager: [phone], text pages welcome

## 2018-01-25 NOTE — Social Work (Addendum)
CSW has received the following return calls: [place] Therapist, art- at capacity, denial [facility]- bed available, requesting progress note and vitals as well as a chest xray  CSW paged MD to request chest x ray to be completed.   3:07pm- CSW aware chest x ray complete, will fax to [facility]. CSW also received a visit from [facility] [employer] resident services coordinator Angelica Chessman and BSW intern Jasmine [place]. Pt has given permission for information to be shared with coordinator. Per coordinator pt is nervous about potential transfer today. Pt has a contentious relationship with her daughter, Roda Shutters.  [facility] and [place] had several questions regarding transfer details and psych referral. CSW answered all questions remaining conscious of HIPPA and will let coordinator know when pt is discharged.  3:46pm- CSW spoke with [facility] coordinator, they have received chest x ray are reviewing. Await return call.  4:28pm- Spoke with Delorise Shiner, coordinator at [facility], they will have bed for pt tomorrow. MD aware we will need summary and prescriptions for pt to discharge tomorrow. IVC paperwork faxed.   Doy Hutching, LCSWA [facility] Health Clinical Social Work [phone]

## 2018-01-26 MED ORDER — ZOLPIDEM TARTRATE 5 MG PO TABS: 5.0000 mg | ORAL_TABLET | Freq: Every evening | ORAL | 0 refills | Status: DC | PRN

## 2018-01-26 MED ORDER — VENLAFAXINE HCL ER 150 MG PO CP24
300.0000 mg | ORAL_CAPSULE | Freq: Every day | ORAL | 0 refills | Status: DC
Start: 2018-01-26 — End: 2018-04-23

## 2018-01-26 MED ORDER — VALACYCLOVIR HCL 500 MG PO TABS: 500.0000 mg | ORAL_TABLET | Freq: Two times a day (BID) | ORAL | 0 refills | Status: DC

## 2018-01-26 MED ORDER — CLONAZEPAM 0.5 MG PO TABS: 0.2500 mg | ORAL_TABLET | Freq: Two times a day (BID) | ORAL | 0 refills | Status: DC

## 2018-01-26 MED ORDER — MIRTAZAPINE 30 MG PO TABS: 30.0000 mg | ORAL_TABLET | Freq: Every day | ORAL | 0 refills | Status: AC

## 2018-01-26 NOTE — Social Work (Signed)
CSW provided RN with correct number to call report, transportation is arranged through [facility]. Pt aware and states understanding of discharge plans. Pt contact Angelica Chessman with [facility] has been made aware of plans and will f/u with Lisette Grinder following intake assessment (likely Thursday evening) to follow pt progress.   IVC papers and discharge summary on chart, AVS printed and will go with pt and Sheriffs deputies, pt personal belongings in bag and RN is aware.CSW signing off. Please consult if any additional needs arise.  Doy Hutching, LCSWA [facility] Clinical Social Work [phone]

## 2018-01-26 NOTE — Progress Notes (Signed)
Attempted to call report x2 to [facility]. Phone continuously rings and disconnects.

## 2018-01-26 NOTE — Care Management Important Message (Signed)
Important Message  Patient Details  Name: Janice Ramos MRN: [id number] Date of Birth: 24-Feb-1941   Medicare Important Message Given:  Yes    Dorena Bodo 01/26/2018, 9:43 AM

## 2018-01-26 NOTE — Care Management Important Message (Signed)
Important Message  Patient Details  Name: Janice Ramos MRN: [id number] Date of Birth: 1941/09/08   Medicare Important Message Given:  Yes Correction:  Patient did not have her glasses so could not read and sign/Unsigned copy left   Dorena Bodo 01/26/2018, 9:44 AM

## 2018-01-26 NOTE — Progress Notes (Signed)
1535 Patient discharged to [facility]- psych. Patient transported by [facility]. Patient's personal belongings reviewed with Amaryllis Dyke and Aurea Graff, RN and sent with officer.

## 2018-01-26 NOTE — Progress Notes (Signed)
Report called to Delorise Shiner, Charity fundraiser at [facility].

## 2018-01-26 NOTE — Social Work (Addendum)
Clinical Social Worker facilitated patient discharge including contacting patient family and facility to confirm patient discharge plans.  Clinical information faxed to facility and family agreeable with plan.  Arranged transport with [facility] [phone], pick up after 2:30pm.   Pt discharging to [facility].   RN to call [phone] and speak to Delorise Shiner with report prior to discharge.  Clinical Social Worker will sign off for now as social work intervention is no longer needed. Please consult [place] again if new need arises.  Doy Hutching, [place] Clinical Social Worker [phone]

## 2018-02-12 ENCOUNTER — Inpatient Hospital Stay: Payer: Medicare Other | Admitting: Internal Medicine

## 2018-04-08 DIAGNOSIS — R197 Diarrhea, unspecified: Secondary | ICD-10-CM

## 2018-04-08 HISTORY — DX: Diarrhea, unspecified: R19.7

## 2018-04-15 ENCOUNTER — Other Ambulatory Visit: Payer: Self-pay

## 2018-04-15 ENCOUNTER — Ambulatory Visit (INDEPENDENT_AMBULATORY_CARE_PROVIDER_SITE_OTHER): Payer: Medicare Other | Admitting: Family Medicine

## 2018-04-15 VITALS — BP 108/50 | HR 78 | Temp 98.6°F | Ht 62.5 in | Wt 134.0 lb

## 2018-04-15 DIAGNOSIS — I10 Essential (primary) hypertension: Secondary | ICD-10-CM

## 2018-04-15 DIAGNOSIS — K625 Hemorrhage of anus and rectum: Secondary | ICD-10-CM

## 2018-04-15 LAB — POCT HEMOGLOBIN: Hemoglobin: 11.6 g/dL — AB (ref 12.2–16.2)

## 2018-04-15 MED ORDER — HYDROCHLOROTHIAZIDE 25 MG PO TABS: 25.0000 mg | ORAL_TABLET | Freq: Every day | ORAL | 3 refills | Status: AC

## 2018-04-15 MED ORDER — LISINOPRIL 10 MG PO TABS: 10.0000 mg | ORAL_TABLET | Freq: Every day | ORAL | 3 refills | Status: AC

## 2018-04-15 NOTE — Patient Instructions (Signed)
It was great seeing you today! I am sorry that you have been having so much trouble with the rectal bleeding. I did not see anything on external exam but that doesn't mean that something is not going on higher up in your gi system. I will make a referral for you to see Gastroenterology as the best step will be to get a colonoscopy.

## 2018-04-16 ENCOUNTER — Telehealth: Payer: Self-pay | Admitting: Family Medicine

## 2018-04-16 NOTE — Telephone Encounter (Signed)
Received call from patient. Is having much worse abdominal pain. Having profuse watery diarrhea, vomiting, and inability to keep down PO. Given patient's presentation yesterday and this represents an acute change recommended patient come in for eval. Offered her option of going to ED, urgent care, or back to our clinic. I highly recommended ED evaluation given the nature of her dehydration and likely need for labs and/or fluids. Coupled with the oncoming weekend, recommended she goes to the ED. Patient in agreement.  Myrene BuddyJacob Jennah Satchell MD PGY-2 Family Medicine Resident

## 2018-04-16 NOTE — Assessment & Plan Note (Signed)
130/72 today. Refilled lisinopril and hctz.

## 2018-04-16 NOTE — Assessment & Plan Note (Signed)
Patient with long history of rectal bleeding. Hemoglobin 11.6 from 13.9. Given that she is well above transfusion threshold will place GI referral for colonoscopy as I see no indication to go to the ED. - Referral to GI - return precautions given 

## 2018-04-16 NOTE — Progress Notes (Signed)
   HPI 77 year old who presents due to 1 year history of abdominal pain, bloody bowel movements, and nausea/vomiting. Patient states that she these suddenly got worse "a few weeks ago" with the diarrhea and the bloody bowel movements getting much more frequent. She had an episode on 8/3 that there was so much blood in the toilet bowl that the bottom was completely obscured. She has started having to wear depends out in public to control the bleeding. She has never had a colonoscopy and isnt sure if she has ever seen a GI specialist.  She has been having fevers, but she attributes these an hsv outbreak. She has been having weight loss, but she chalks this up to poor eating habits.  CC: bloody diarrhea   ROS:   Review of Systems See HPI for ROS.   CC, SH/smoking status, and VS noted  Objective: BP (!) 108/50   Pulse 78   Temp 98.6 F (37 C) (Oral)   Ht 5' 2.5" (1.588 m)   Wt 134 lb (60.8 kg)   SpO2 96%   BMI 24.12 kg/m  Gen: NAD, alert, cooperative, and pleasant. Well appearing caucasian female HEENT: NCAT, EOMI, PERRL CV: RRR, no murmur Resp: CTAB, no wheezes, non-labored Abd: SNTND, BS present, no guarding or organomegaly Ext: No edema, warm Neuro: Alert and oriented, Speech clear, No gross deficits Rectal: Macerated skin all around anal opening, no obvious external hemorrhoids, skin tears, fissures or fistulas.   Assessment and plan:  Rectal bleeding Patient with long history of rectal bleeding. Hemoglobin 11.6 from 13.9. Given that she is well above transfusion threshold will place GI referral for colonoscopy as I see no indication to go to the ED. - Referral to GI - return precautions given  ESSENTIAL HYPERTENSION, BENIGN 130/72 today. Refilled lisinopril and hctz.   Orders Placed This Encounter  Procedures  . Ambulatory referral to Gastroenterology    Referral Priority:   Routine    Referral Type:   Consultation    Referral Reason:   Specialty Services Required     Number of Visits Requested:   1  . POCT hemoglobin    Associate with Z13.0    Meds ordered this encounter  Medications  . lisinopril (PRINIVIL,ZESTRIL) 10 MG tablet    Sig: Take 1 tablet (10 mg total) by mouth daily.    Dispense:  90 tablet    Refill:  3  . hydrochlorothiazide (HYDRODIURIL) 25 MG tablet    Sig: Take 1 tablet (25 mg total) by mouth daily.    Dispense:  90 tablet    Refill:  3     Myrene BuddyJacob Tamla Winkels MD PGY-2 Family Medicine Resident  04/16/2018 10:59 AM

## 2018-04-16 NOTE — Assessment & Plan Note (Deleted)
Patient with long history of rectal bleeding. Hemoglobin 11.6 from 13.9. Given that she is well above transfusion threshold will place GI referral for colonoscopy as I see no indication to go to the ED. - Referral to GI - return precautions given

## 2018-04-22 ENCOUNTER — Inpatient Hospital Stay (HOSPITAL_COMMUNITY)
Admission: EM | Admit: 2018-04-22 | Discharge: 2018-05-10 | DRG: 385 | Disposition: A | Discharge status: Skilled Nursing Facility | Payer: Medicare Other | Attending: Family Medicine | Admitting: Family Medicine

## 2018-04-22 ENCOUNTER — Encounter (HOSPITAL_COMMUNITY): Payer: Self-pay | Admitting: Emergency Medicine

## 2018-04-22 DIAGNOSIS — F419 Anxiety disorder, unspecified: Secondary | ICD-10-CM | POA: Diagnosis present

## 2018-04-22 DIAGNOSIS — T380X5A Adverse effect of glucocorticoids and synthetic analogues, initial encounter: Secondary | ICD-10-CM | POA: Diagnosis present

## 2018-04-22 DIAGNOSIS — R109 Unspecified abdominal pain: Secondary | ICD-10-CM

## 2018-04-22 DIAGNOSIS — Z91040 Latex allergy status: Secondary | ICD-10-CM

## 2018-04-22 DIAGNOSIS — K559 Vascular disorder of intestine, unspecified: Secondary | ICD-10-CM | POA: Diagnosis present

## 2018-04-22 DIAGNOSIS — D62 Acute posthemorrhagic anemia: Secondary | ICD-10-CM

## 2018-04-22 DIAGNOSIS — K5150 Left sided colitis without complications: Secondary | ICD-10-CM | POA: Diagnosis not present

## 2018-04-22 DIAGNOSIS — K921 Melena: Secondary | ICD-10-CM

## 2018-04-22 DIAGNOSIS — Z66 Do not resuscitate: Secondary | ICD-10-CM | POA: Diagnosis present

## 2018-04-22 DIAGNOSIS — R140 Abdominal distension (gaseous): Secondary | ICD-10-CM

## 2018-04-22 DIAGNOSIS — K264 Chronic or unspecified duodenal ulcer with hemorrhage: Secondary | ICD-10-CM | POA: Diagnosis present

## 2018-04-22 DIAGNOSIS — E861 Hypovolemia: Secondary | ICD-10-CM | POA: Diagnosis present

## 2018-04-22 DIAGNOSIS — K51811 Other ulcerative colitis with rectal bleeding: Secondary | ICD-10-CM | POA: Diagnosis present

## 2018-04-22 DIAGNOSIS — E785 Hyperlipidemia, unspecified: Secondary | ICD-10-CM | POA: Diagnosis present

## 2018-04-22 DIAGNOSIS — K529 Noninfective gastroenteritis and colitis, unspecified: Secondary | ICD-10-CM

## 2018-04-22 DIAGNOSIS — F39 Unspecified mood [affective] disorder: Secondary | ICD-10-CM | POA: Diagnosis present

## 2018-04-22 DIAGNOSIS — Z6823 Body mass index (BMI) 23.0-23.9, adult: Secondary | ICD-10-CM

## 2018-04-22 DIAGNOSIS — E871 Hypo-osmolality and hyponatremia: Secondary | ICD-10-CM | POA: Diagnosis present

## 2018-04-22 DIAGNOSIS — R32 Unspecified urinary incontinence: Secondary | ICD-10-CM | POA: Diagnosis present

## 2018-04-22 DIAGNOSIS — D500 Iron deficiency anemia secondary to blood loss (chronic): Secondary | ICD-10-CM | POA: Diagnosis present

## 2018-04-22 DIAGNOSIS — R933 Abnormal findings on diagnostic imaging of other parts of digestive tract: Secondary | ICD-10-CM

## 2018-04-22 DIAGNOSIS — I10 Essential (primary) hypertension: Secondary | ICD-10-CM | POA: Diagnosis present

## 2018-04-22 DIAGNOSIS — E46 Unspecified protein-calorie malnutrition: Secondary | ICD-10-CM | POA: Diagnosis present

## 2018-04-22 DIAGNOSIS — B3781 Candidal esophagitis: Secondary | ICD-10-CM | POA: Diagnosis present

## 2018-04-22 DIAGNOSIS — E1165 Type 2 diabetes mellitus with hyperglycemia: Secondary | ICD-10-CM | POA: Diagnosis present

## 2018-04-22 DIAGNOSIS — N179 Acute kidney failure, unspecified: Secondary | ICD-10-CM | POA: Diagnosis present

## 2018-04-22 DIAGNOSIS — F332 Major depressive disorder, recurrent severe without psychotic features: Secondary | ICD-10-CM | POA: Diagnosis present

## 2018-04-22 DIAGNOSIS — K269 Duodenal ulcer, unspecified as acute or chronic, without hemorrhage or perforation: Secondary | ICD-10-CM | POA: Diagnosis present

## 2018-04-22 DIAGNOSIS — Z79899 Other long term (current) drug therapy: Secondary | ICD-10-CM

## 2018-04-22 DIAGNOSIS — E7800 Pure hypercholesterolemia, unspecified: Secondary | ICD-10-CM | POA: Diagnosis present

## 2018-04-22 DIAGNOSIS — G5701 Lesion of sciatic nerve, right lower limb: Secondary | ICD-10-CM | POA: Diagnosis present

## 2018-04-22 DIAGNOSIS — B0052 Herpesviral keratitis: Secondary | ICD-10-CM

## 2018-04-22 DIAGNOSIS — A6000 Herpesviral infection of urogenital system, unspecified: Secondary | ICD-10-CM | POA: Diagnosis present

## 2018-04-22 DIAGNOSIS — R339 Retention of urine, unspecified: Secondary | ICD-10-CM | POA: Diagnosis not present

## 2018-04-22 HISTORY — DX: Diarrhea, unspecified: R19.7

## 2018-04-22 HISTORY — DX: Anorexia: R63.0

## 2018-04-22 HISTORY — DX: Anxiety disorder, unspecified: F41.9

## 2018-04-22 HISTORY — DX: Poisoning by unspecified drugs, medicaments and biological substances, intentional self-harm, initial encounter: T50.902A

## 2018-04-22 HISTORY — DX: Ingrowing nail: L60.0

## 2018-04-22 HISTORY — DX: Herpesviral keratitis: B00.52

## 2018-04-22 HISTORY — DX: Herpesviral infection, unspecified: B00.9

## 2018-04-22 LAB — CBC
HEMATOCRIT: 36.8 % (ref 36.0–46.0)
Hemoglobin: 12.2 g/dL (ref 12.0–15.0)
MCH: 31.3 pg (ref 26.0–34.0)
MCHC: 33.2 g/dL (ref 30.0–36.0)
MCV: 94.4 fL (ref 78.0–100.0)
Platelets: 443 10*3/uL — ABNORMAL HIGH (ref 150–400)
RBC: 3.9 MIL/uL (ref 3.87–5.11)
RDW: 13.6 % (ref 11.5–15.5)
WBC: 11.6 10*3/uL — ABNORMAL HIGH (ref 4.0–10.5)

## 2018-04-22 LAB — COMPREHENSIVE METABOLIC PANEL
ALBUMIN: 2.7 g/dL — AB (ref 3.5–5.0)
ALT: 22 U/L (ref 0–44)
AST: 30 U/L (ref 15–41)
Alkaline Phosphatase: 58 U/L (ref 38–126)
Anion gap: 11 (ref 5–15)
BILIRUBIN TOTAL: 0.9 mg/dL (ref 0.3–1.2)
BUN: 26 mg/dL — ABNORMAL HIGH (ref 8–23)
CO2: 24 mmol/L (ref 22–32)
CREATININE: 1.36 mg/dL — AB (ref 0.44–1.00)
Calcium: 8.2 mg/dL — ABNORMAL LOW (ref 8.9–10.3)
Chloride: 96 mmol/L — ABNORMAL LOW (ref 98–111)
GFR calc Af Amer: 43 mL/min — ABNORMAL LOW (ref >60)
GFR calc non Af Amer: 37 mL/min — ABNORMAL LOW (ref >60)
GLUCOSE: 118 mg/dL — AB (ref 70–99)
POTASSIUM: 3.5 mmol/L (ref 3.5–5.1)
Sodium: 131 mmol/L — ABNORMAL LOW (ref 135–145)
TOTAL PROTEIN: 6.9 g/dL (ref 6.5–8.1)

## 2018-04-22 LAB — POC OCCULT BLOOD, ED: Fecal Occult Bld: POSITIVE — AB

## 2018-04-22 LAB — LIPASE, BLOOD: Lipase: 43 U/L (ref 11–51)

## 2018-04-22 MED ORDER — SODIUM CHLORIDE 0.9 % IV BOLUS
1000.0000 mL | Freq: Once | INTRAVENOUS | Status: AC
  Administered 2018-04-22: 1000 mL via INTRAVENOUS

## 2018-04-22 MED ORDER — FENTANYL CITRATE (PF) 100 MCG/2ML IJ SOLN
50.0000 ug | Freq: Once | INTRAMUSCULAR | Status: AC
  Administered 2018-04-23: via INTRAVENOUS
  Filled 2018-04-22: qty 2

## 2018-04-22 NOTE — ED Provider Notes (Signed)
[facility]   CSN: [id number] Arrival date & time: 04/22/18  1628     History   Chief Complaint Chief Complaint  Patient presents with  . Abdominal Pain    HPI Janice Ramos is a 77 y.o. female.  77 year old female with a history of dyslipidemia, diabetes, IBS, bipolar 1 disorder, depression presents to the emergency department for evaluation of bright red blood per rectum over the past 2 weeks.  She states the symptoms have been associated with watery diarrhea mixed with blood.  She has had nausea as well as vomiting and retching over the past week.  She denies any hematemesis.  No black or tarry stool.  Patient complaining of abdominal pain at this time.  Has been migratory and is currently mostly in her upper abdomen.  She has had lower abdominal pain as well.  Denies ever having had a colonoscopy.  No associated fevers.  She saw her primary care doctor regarding the symptoms 1 week ago.  Decided to come to the emergency department as they have been worsening.  Abdominal surgical hx significant for tubal ligation.  Denies recent travel or antibiotic use.    Past Medical History:  Diagnosis Date  . Allergy   . Bipolar 1 disorder (HCC)   . Depression   . Diabetes mellitus   . Genital herpes in women 1983  . Hyperlipidemia   . Irritable bowel syndrome (IBS)   . Overdose of benzodiazepine 06/2005   Xanax  . Sinus disorder     Patient Active Problem List   Diagnosis Date Noted  . Rectal bleeding 04/15/2018  . Benzodiazepine overdose, intentional self-harm, initial encounter (HCC) 01/22/2018  . Intentional drug overdose (HCC)   . MDD (major depressive disorder), recurrent severe, without psychosis (HCC)   . Alcohol abuse with alcohol-induced mood disorder (HCC) 08/20/2017  . Eye pain 03/26/2017  . 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 Ingrown toenail 08/16/2015  . Seborrheic dermatitis of scalp 01/30/2015  . Loss  of appetite 01/30/2015  . Hair loss 10/16/2014  . Lumbar pain 06/29/2014  . Trochanteric bursitis of right hip 06/21/2014  . Greater trochanteric bursitis of left hip 05/31/2014  . Healthcare maintenance 05/31/2014  . Axillary mass 05/31/2014  . 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 Right hip pain 10/10/2013  . Piriformis syndrome of right side 06/10/2013  . Syncopal episodes 04/06/2012  . Headache 12/10/2011  . Wrist pain, chronic 11/17/2011  . Thumb pain 08/06/2011  . Trochanteric bursitis 06/22/2011  . 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 Fatigue 03/26/2011  . Weight loss 03/26/2011  . Urinary incontinence 11/12/2010  . GENITAL HERPES 08/05/2010  . INSOMNIA 01/26/2009  . Pre-diabetes 12/15/2008  . DEPRESSION, MAJOR 05/17/2008  . ESSENTIAL HYPERTENSION, BENIGN 05/17/2008  . HYPERCHOLESTEROLEMIA 11/05/2006  . BIPOLAR DISORDER 11/05/2006    Past Surgical History:  Procedure Laterality Date  . CATARACT EXTRACTION    . Cataract Surgery     L eye 12/2008, R eye 05/2009 [facility]  . EYE SURGERY    . TUBAL LIGATION       OB History   None      Home Medications    Prior to Admission medications   Medication Sig Start Date End Date Taking? Authorizing Provider  acyclovir (ZOVIRAX) 400 MG tablet Take 1 tablet (400 mg total) 5 (five) times daily by mouth.  07/27/17   Hayden Rasmussen, NP  ALPRAZolam Prudy Feeler) 1 MG tablet Take 1 mg by mouth 3 (three) times daily as needed for anxiety. Can take an additional tablet at bedtime for sleep. 01/15/18   [provider]  clonazePAM (KLONOPIN) 0.5 MG tablet Take 0.5 tablets (0.25 mg total) by mouth 2 (two) times daily. 01/26/18   [name], MD  erythromycin ophthalmic ointment Place a 1/2 inch ribbon of ointment into the lower eyelid. 07/27/17   Hayden Rasmussen, NP  hydrochlorothiazide (HYDRODIURIL) 25 MG tablet Take 1  tablet (25 mg total) by mouth daily. 04/15/18   [name], MD  hydrOXYzine (ATARAX/VISTARIL) 10 MG tablet Take 1 tablet (10 mg total) by mouth 3 (three) times daily as needed. 01/22/17   Beaulah Dinning, MD  lisinopril (PRINIVIL,ZESTRIL) 10 MG tablet Take 1 tablet (10 mg total) by mouth daily. 04/15/18   [name], MD  methylphenidate (RITALIN) 20 MG tablet Take 20 mg by mouth 2 (two) times daily. 01/15/18   [provider]  mirtazapine (REMERON) 30 MG tablet TAKE 1 TABLET BY MOUTH EVERYDAY AT BEDTIME 07/27/17   [provider]  mirtazapine (REMERON) 30 MG tablet Take 1 tablet (30 mg total) by mouth at bedtime. 01/26/18   [name], MD  tobramycin (TOBREX) 0.3 % ophthalmic solution Place 1 drop into both eyes every 6 (six) hours. 07/04/17   Elvina Sidle, MD  traZODone (DESYREL) 50 MG tablet Take 50 mg by mouth at bedtime. Per [facility]     [provider]  valACYclovir (VALTREX) 500 MG tablet Take 1 tablet (500 mg total) by mouth 2 (two) times daily. 07/04/17   Elvina Sidle, MD  valACYclovir (VALTREX) 500 MG tablet Take 1 tablet (500 mg total) by mouth 2 (two) times daily. 01/26/18   [name], MD  venlafaxine (EFFEXOR-XR) 150 MG 24 hr capsule Take 300 mg by mouth daily. Per [facility] on [employer], Historical, MD  venlafaxine XR (EFFEXOR-XR) 150 MG 24 hr capsule Take 2 capsules (300 mg total) by mouth daily. 01/26/18   [name], MD  vitamin C (ASCORBIC ACID) 500 MG tablet Take 500 mg by mouth daily.    [provider]  zolpidem (AMBIEN) 5 MG tablet Take 1 tablet (5 mg total) by mouth at bedtime as needed for sleep. 01/26/18   [name], MD    Family History Family History  Problem Relation Age of Onset  . Cancer Mother        Bone  . Suicidality Father     Social History Social History   Tobacco Use  . Smoking status: Never Smoker  . Smokeless tobacco: Never Used    Substance Use Topics  . Alcohol use: Yes    Alcohol/week: 0.0 standard drinks    Comment: 1 x per year  . Drug use: No     Allergies   Latex   Review of Systems Review of Systems Ten systems reviewed and are negative for acute change, except as noted in the HPI.    Physical Exam Updated Vital Signs BP 136/71   Pulse 90   Temp 100.2 F (37.9 C) (Oral)   Resp (!) 23   SpO2 100%   Physical Exam  Constitutional: She is oriented to person, place, and time. She appears well-developed and well-nourished. No distress.  Thin, frail appearing. Nontoxic.  HENT:  Head: Normocephalic and atraumatic.  Eyes: Conjunctivae and EOM are normal. No scleral icterus.  Neck: Normal range of motion.  Cardiovascular: Normal rate, regular rhythm and intact distal pulses.  HR 90's  Pulmonary/Chest: Effort normal. No stridor. No respiratory distress.  Abdominal: Soft. She exhibits no mass. There is tenderness.  TTP in the LUQ and RUQ. No guarding or palpable masses. No peritoneal signs. Abdomen soft, nondistended.  Genitourinary:  Genitourinary Comments: Normal rectal tone on DRE. Soft, brown stool with specks of BRB.  Musculoskeletal: Normal range of motion.  Neurological: She is alert and oriented to person, place, and time. She exhibits normal muscle tone. Coordination normal.  Skin: Skin is warm and dry. No rash noted. She is not diaphoretic. No erythema. No pallor.  Psychiatric: She has a normal mood and affect. Her behavior is normal.  Nursing note and vitals reviewed.    ED Treatments / Results  Labs (all labs ordered are listed, but only abnormal results are displayed) Labs Reviewed  COMPREHENSIVE METABOLIC PANEL - Abnormal; Notable for the following components:      Result Value   Sodium 131 (*)    Chloride 96 (*)    Glucose, Bld 118 (*)    BUN 26 (*)    Creatinine, Ser 1.36 (*)    Calcium 8.2 (*)    Albumin 2.7 (*)    GFR calc non Af Amer 37 (*)    GFR calc Af Amer 43  (*)    All other components within normal limits  CBC - Abnormal; Notable for the following components:   WBC 11.6 (*)    Platelets 443 (*)    All other components within normal limits  POC OCCULT BLOOD, ED - Abnormal; Notable for the following components:   Fecal Occult Bld POSITIVE (*)    All other components within normal limits  GASTROINTESTINAL PANEL BY PCR, STOOL (REPLACES STOOL CULTURE)  C DIFFICILE QUICK SCREEN W PCR REFLEX  LIPASE, BLOOD  URINALYSIS, ROUTINE W REFLEX MICROSCOPIC    EKG None  Radiology Ct Abdomen Pelvis W Contrast  Result Date: 04/23/2018 CLINICAL DATA:  Lower abdominal pain and bloody diarrhea for 1 week EXAM: CT ABDOMEN AND PELVIS WITH CONTRAST TECHNIQUE: Multidetector CT imaging of the abdomen and pelvis was performed using the standard protocol following bolus administration of intravenous contrast. CONTRAST:  80mL OMNIPAQUE IOHEXOL 300 MG/ML  SOLN COMPARISON:  None. FINDINGS: Lower chest: No acute abnormality. Hepatobiliary: Scattered cystic lesions are again identified throughout the liver. No focal mass is noted. The gallbladder is within normal limits. Pancreas: Unremarkable. No pancreatic ductal dilatation or surrounding inflammatory changes. Spleen: Normal in size without focal abnormality. Adrenals/Urinary Tract: The adrenal glands are unremarkable. No renal calculi or obstructive changes are seen. The bladder is partially distended. Stomach/Bowel: There is diffuse wall thickening of the descending and sigmoid colon. This is likely inflammatory in nature and would correspond with the patient's given clinical history of bloody diarrhea. The appendix is not well visualized although no inflammatory changes are seen. No obstructive changes are noted. Vascular/Lymphatic: Aortic atherosclerosis. No enlarged abdominal or pelvic lymph nodes. Reproductive: Uterus and bilateral adnexa are unremarkable. Other: No abdominal wall hernia or abnormality. No abdominopelvic  ascites. Musculoskeletal: No acute bony abnormality is noted. IMPRESSION: Diffuse wall thickening inflammatory change in the descending and sigmoid colons most consistent with an inflammatory colitis. No perforation is noted. No other acute abnormality is noted. Electronically Signed   By: Alcide Clever M.D.   On: 04/23/2018 00:51    Procedures Procedures (including critical care time)   Medications Ordered in ED  Medications  ciprofloxacin (CIPRO) IVPB 400 mg (has no administration in time range)  metroNIDAZOLE (FLAGYL) IVPB 500 mg (has no administration in time range)  sodium chloride 0.9 % bolus 1,000 mL (0 mLs Intravenous Stopped 04/23/18 0031)  fentaNYL (SUBLIMAZE) injection 50 mcg ( Intravenous Given 04/23/18 0001)  iohexol (OMNIPAQUE) 300 MG/ML solution 100 mL (80 mLs Intravenous Contrast Given 04/23/18 0028)    CRITICAL CARE Performed by: Antony MaduraKelly Gardiner Espana   Total critical care time: 35 minutes  Critical care time was exclusive of separately billable procedures and treating other patients.  Critical care was necessary to treat or prevent imminent or life-threatening deterioration.  Critical care was time spent personally by me on the following activities: development of treatment plan with patient and/or surrogate as well as nursing, discussions with consultants, evaluation of patient's response to treatment, examination of patient, obtaining history from patient or surrogate, ordering and performing treatments and interventions, ordering and review of laboratory studies, ordering and review of radiographic studies, pulse oximetry and re-evaluation of patient's condition.   Initial Impression / Assessment and Plan / ED Course  I have reviewed the triage vital signs and the nursing notes.  Pertinent labs & imaging results that were available during my care of the patient were reviewed by me and considered in my medical decision making (see chart for details).     77 year old female  presenting for ongoing watery diarrhea and hematochezia associated with generalized abdominal pain.  She has developed nausea and vomiting with inability to tolerate PO food or fluids at home.  No peritoneal signs on abdominal exam.  She does have a low-grade temperature of 100.2 F.  Tachycardic on arrival with stable blood pressure.  She is noted to have a leukocytosis of 11.6.  This corresponds to findings of colitis on CT.  No evidence of perforation.  Given ongoing symptoms with tachycardia and low-grade fever, will admit for IV antibiotics and continued hydration.  Would also benefit from serial CBCs.  May benefit from GI consult during admission.  Patient agreeable to plan.   Final Clinical Impressions(s) / ED Diagnoses   Final diagnoses:  Colitis  Hematochezia    ED Discharge Orders    None       Antony MaduraHumes, Carlous Olivares, PA-C 04/23/18 0116    Melene PlanFloyd, Dan, DO 04/23/18 [phone]

## 2018-04-22 NOTE — ED Triage Notes (Signed)
Pt BIB [facility] from home with c/o lower abdominal pain, nausea, vomiting, and "bloody diarrhea" x 1 week. Hx IBS, EMS vitals: 122/76, HR 106, CBG 132

## 2018-04-23 ENCOUNTER — Emergency Department (HOSPITAL_COMMUNITY): Payer: Medicare Other

## 2018-04-23 ENCOUNTER — Other Ambulatory Visit: Payer: Self-pay

## 2018-04-23 ENCOUNTER — Encounter (HOSPITAL_COMMUNITY): Payer: Self-pay | Admitting: General Practice

## 2018-04-23 DIAGNOSIS — F331 Major depressive disorder, recurrent, moderate: Secondary | ICD-10-CM

## 2018-04-23 DIAGNOSIS — E871 Hypo-osmolality and hyponatremia: Secondary | ICD-10-CM | POA: Diagnosis present

## 2018-04-23 DIAGNOSIS — K921 Melena: Secondary | ICD-10-CM | POA: Diagnosis present

## 2018-04-23 DIAGNOSIS — D500 Iron deficiency anemia secondary to blood loss (chronic): Secondary | ICD-10-CM | POA: Diagnosis present

## 2018-04-23 DIAGNOSIS — E46 Unspecified protein-calorie malnutrition: Secondary | ICD-10-CM | POA: Diagnosis present

## 2018-04-23 DIAGNOSIS — R339 Retention of urine, unspecified: Secondary | ICD-10-CM | POA: Diagnosis not present

## 2018-04-23 DIAGNOSIS — R110 Nausea: Secondary | ICD-10-CM | POA: Diagnosis not present

## 2018-04-23 DIAGNOSIS — K5150 Left sided colitis without complications: Secondary | ICD-10-CM | POA: Diagnosis present

## 2018-04-23 DIAGNOSIS — K264 Chronic or unspecified duodenal ulcer with hemorrhage: Secondary | ICD-10-CM | POA: Diagnosis present

## 2018-04-23 DIAGNOSIS — E1165 Type 2 diabetes mellitus with hyperglycemia: Secondary | ICD-10-CM | POA: Diagnosis present

## 2018-04-23 DIAGNOSIS — K625 Hemorrhage of anus and rectum: Secondary | ICD-10-CM | POA: Diagnosis not present

## 2018-04-23 DIAGNOSIS — K529 Noninfective gastroenteritis and colitis, unspecified: Secondary | ICD-10-CM | POA: Diagnosis not present

## 2018-04-23 DIAGNOSIS — K559 Vascular disorder of intestine, unspecified: Secondary | ICD-10-CM | POA: Diagnosis present

## 2018-04-23 DIAGNOSIS — E785 Hyperlipidemia, unspecified: Secondary | ICD-10-CM | POA: Diagnosis present

## 2018-04-23 DIAGNOSIS — Z66 Do not resuscitate: Secondary | ICD-10-CM | POA: Diagnosis present

## 2018-04-23 DIAGNOSIS — R933 Abnormal findings on diagnostic imaging of other parts of digestive tract: Secondary | ICD-10-CM | POA: Diagnosis not present

## 2018-04-23 DIAGNOSIS — R1084 Generalized abdominal pain: Secondary | ICD-10-CM | POA: Diagnosis not present

## 2018-04-23 DIAGNOSIS — D62 Acute posthemorrhagic anemia: Secondary | ICD-10-CM | POA: Diagnosis not present

## 2018-04-23 DIAGNOSIS — E7800 Pure hypercholesterolemia, unspecified: Secondary | ICD-10-CM | POA: Diagnosis present

## 2018-04-23 DIAGNOSIS — A6000 Herpesviral infection of urogenital system, unspecified: Secondary | ICD-10-CM | POA: Diagnosis present

## 2018-04-23 DIAGNOSIS — I10 Essential (primary) hypertension: Secondary | ICD-10-CM | POA: Diagnosis present

## 2018-04-23 DIAGNOSIS — B0052 Herpesviral keratitis: Secondary | ICD-10-CM | POA: Diagnosis present

## 2018-04-23 DIAGNOSIS — F332 Major depressive disorder, recurrent severe without psychotic features: Secondary | ICD-10-CM | POA: Diagnosis present

## 2018-04-23 DIAGNOSIS — N179 Acute kidney failure, unspecified: Secondary | ICD-10-CM | POA: Diagnosis present

## 2018-04-23 DIAGNOSIS — K51811 Other ulcerative colitis with rectal bleeding: Secondary | ICD-10-CM | POA: Diagnosis present

## 2018-04-23 DIAGNOSIS — K51211 Ulcerative (chronic) proctitis with rectal bleeding: Secondary | ICD-10-CM | POA: Diagnosis not present

## 2018-04-23 DIAGNOSIS — Z915 Personal history of self-harm: Secondary | ICD-10-CM | POA: Diagnosis not present

## 2018-04-23 DIAGNOSIS — F39 Unspecified mood [affective] disorder: Secondary | ICD-10-CM | POA: Diagnosis present

## 2018-04-23 DIAGNOSIS — B3781 Candidal esophagitis: Secondary | ICD-10-CM | POA: Diagnosis present

## 2018-04-23 DIAGNOSIS — F419 Anxiety disorder, unspecified: Secondary | ICD-10-CM | POA: Diagnosis present

## 2018-04-23 DIAGNOSIS — R32 Unspecified urinary incontinence: Secondary | ICD-10-CM | POA: Diagnosis present

## 2018-04-23 DIAGNOSIS — R140 Abdominal distension (gaseous): Secondary | ICD-10-CM | POA: Diagnosis not present

## 2018-04-23 DIAGNOSIS — G5701 Lesion of sciatic nerve, right lower limb: Secondary | ICD-10-CM | POA: Diagnosis present

## 2018-04-23 DIAGNOSIS — D72829 Elevated white blood cell count, unspecified: Secondary | ICD-10-CM | POA: Diagnosis not present

## 2018-04-23 DIAGNOSIS — D649 Anemia, unspecified: Secondary | ICD-10-CM | POA: Diagnosis not present

## 2018-04-23 DIAGNOSIS — K269 Duodenal ulcer, unspecified as acute or chronic, without hemorrhage or perforation: Secondary | ICD-10-CM | POA: Diagnosis not present

## 2018-04-23 DIAGNOSIS — T380X5A Adverse effect of glucocorticoids and synthetic analogues, initial encounter: Secondary | ICD-10-CM | POA: Diagnosis present

## 2018-04-23 DIAGNOSIS — R197 Diarrhea, unspecified: Secondary | ICD-10-CM | POA: Diagnosis not present

## 2018-04-23 DIAGNOSIS — E861 Hypovolemia: Secondary | ICD-10-CM | POA: Diagnosis present

## 2018-04-23 LAB — GASTROINTESTINAL PANEL BY PCR, STOOL (REPLACES STOOL CULTURE)
Adenovirus F40/41: NOT DETECTED
Astrovirus: NOT DETECTED
CYCLOSPORA CAYETANENSIS: NOT DETECTED
Campylobacter species: NOT DETECTED
Cryptosporidium: NOT DETECTED
ENTAMOEBA HISTOLYTICA: NOT DETECTED
Enteroaggregative E coli (EAEC): NOT DETECTED
Enteropathogenic E coli (EPEC): NOT DETECTED
Enterotoxigenic E coli (ETEC): NOT DETECTED
Giardia lamblia: NOT DETECTED
Norovirus GI/GII: NOT DETECTED
Plesimonas shigelloides: NOT DETECTED
Rotavirus A: NOT DETECTED
SALMONELLA SPECIES: NOT DETECTED
SHIGA LIKE TOXIN PRODUCING E COLI (STEC): NOT DETECTED
SHIGELLA/ENTEROINVASIVE E COLI (EIEC): NOT DETECTED
Sapovirus (I, II, IV, and V): NOT DETECTED
VIBRIO CHOLERAE: NOT DETECTED
VIBRIO SPECIES: NOT DETECTED
Yersinia enterocolitica: NOT DETECTED

## 2018-04-23 LAB — CBC
HCT: 30.7 % — ABNORMAL LOW (ref 36.0–46.0)
HEMATOCRIT: 30.7 % — AB (ref 36.0–46.0)
Hemoglobin: 9.9 g/dL — ABNORMAL LOW (ref 12.0–15.0)
Hemoglobin: 10.2 g/dL — ABNORMAL LOW (ref 12.0–15.0)
MCH: 30.7 pg (ref 26.0–34.0)
MCH: 31 pg (ref 26.0–34.0)
MCHC: 32.2 g/dL (ref 30.0–36.0)
MCHC: 33.2 g/dL (ref 30.0–36.0)
MCV: 95.3 fL (ref 78.0–100.0)
MCV: 93.3 fL (ref 78.0–100.0)
PLATELETS: 368 10*3/uL (ref 150–400)
PLATELETS: 399 10*3/uL (ref 150–400)
RBC: 3.22 MIL/uL — AB (ref 3.87–5.11)
RBC: 3.29 MIL/uL — ABNORMAL LOW (ref 3.87–5.11)
RDW: 13.8 % (ref 11.5–15.5)
RDW: 13.7 % (ref 11.5–15.5)
WBC: 9.8 10*3/uL (ref 4.0–10.5)
WBC: 9.2 10*3/uL (ref 4.0–10.5)

## 2018-04-23 LAB — BASIC METABOLIC PANEL
Anion gap: 11 (ref 5–15)
BUN: 23 mg/dL (ref 8–23)
CALCIUM: 7.3 mg/dL — AB (ref 8.9–10.3)
CO2: 18 mmol/L — ABNORMAL LOW (ref 22–32)
Chloride: 105 mmol/L (ref 98–111)
Creatinine, Ser: 1.02 mg/dL — ABNORMAL HIGH (ref 0.44–1.00)
GFR calc Af Amer: >60 mL/min (ref >60)
GFR, EST NON AFRICAN AMERICAN: 52 mL/min — AB (ref >60)
Glucose, Bld: 95 mg/dL (ref 70–99)
Potassium: 3.5 mmol/L (ref 3.5–5.1)
SODIUM: 134 mmol/L — AB (ref 135–145)

## 2018-04-23 LAB — ABO/RH: ABO/RH(D): O POS

## 2018-04-23 LAB — C DIFFICILE QUICK SCREEN W PCR REFLEX
C DIFFICLE (CDIFF) ANTIGEN: NEGATIVE
C Diff interpretation: NOT DETECTED
C Diff toxin: NEGATIVE

## 2018-04-23 LAB — TYPE AND SCREEN
ABO/RH(D): O POS
ANTIBODY SCREEN: NEGATIVE

## 2018-04-23 LAB — VALPROIC ACID LEVEL: Valproic Acid Lvl: <10 ug/mL — ABNORMAL LOW (ref 50.0–100.0)

## 2018-04-23 MED ORDER — MIRTAZAPINE 15 MG PO TABS
30.0000 mg | ORAL_TABLET | Freq: Every day | ORAL | Status: DC
  Administered 2018-04-23 – 2018-05-09 (×17): 30 mg via ORAL
  Filled 2018-04-23 (×18): qty 2

## 2018-04-23 MED ORDER — SODIUM CHLORIDE 0.9 % IV SOLN
INTRAVENOUS | Status: AC
  Administered 2018-04-23 – 2018-04-24 (×3): via INTRAVENOUS

## 2018-04-23 MED ORDER — HYDROXYZINE HCL 10 MG PO TABS
10.0000 mg | ORAL_TABLET | Freq: Three times a day (TID) | ORAL | Status: DC | PRN
  Filled 2018-04-23 (×2): qty 1

## 2018-04-23 MED ORDER — THIAMINE HCL 100 MG/ML IJ SOLN
100.0000 mg | Freq: Every day | INTRAMUSCULAR | Status: DC
  Filled 2018-04-23: qty 2

## 2018-04-23 MED ORDER — CIPROFLOXACIN IN D5W 400 MG/200ML IV SOLN: 400.0000 mg | INTRAVENOUS | Status: DC

## 2018-04-23 MED ORDER — ACETAMINOPHEN 325 MG PO TABS
650.0000 mg | ORAL_TABLET | Freq: Four times a day (QID) | ORAL | Status: DC | PRN
  Administered 2018-04-23 – 2018-05-05 (×7): 650 mg via ORAL
  Filled 2018-04-23 (×8): qty 2

## 2018-04-23 MED ORDER — SODIUM CHLORIDE 0.9 % IV SOLN
2.0000 g | INTRAVENOUS | Status: DC
  Administered 2018-04-23: 2 g via INTRAVENOUS
  Filled 2018-04-23: qty 20

## 2018-04-23 MED ORDER — PRO-STAT SUGAR FREE PO LIQD
30.0000 mL | Freq: Every day | ORAL | Status: DC
  Administered 2018-04-23 – 2018-05-08 (×6): 30 mL via ORAL
  Filled 2018-04-23 (×12): qty 30

## 2018-04-23 MED ORDER — SODIUM CHLORIDE 0.9 % IV BOLUS
1000.0000 mL | Freq: Once | INTRAVENOUS | Status: AC
  Administered 2018-04-23: 1000 mL via INTRAVENOUS

## 2018-04-23 MED ORDER — METRONIDAZOLE IN NACL 5-0.79 MG/ML-% IV SOLN
500.0000 mg | Freq: Once | INTRAVENOUS | Status: AC
  Administered 2018-04-23: 500 mg via INTRAVENOUS
  Filled 2018-04-23: qty 100

## 2018-04-23 MED ORDER — IOHEXOL 300 MG/ML  SOLN
100.0000 mL | Freq: Once | INTRAMUSCULAR | Status: AC | PRN
  Administered 2018-04-23: 80 mL via INTRAVENOUS

## 2018-04-23 MED ORDER — VALACYCLOVIR HCL 500 MG PO TABS
500.0000 mg | ORAL_TABLET | Freq: Two times a day (BID) | ORAL | Status: DC
  Administered 2018-04-23 – 2018-04-29 (×14): 500 mg via ORAL
  Filled 2018-04-23 (×14): qty 1

## 2018-04-23 MED ORDER — METRONIDAZOLE IN NACL 5-0.79 MG/ML-% IV SOLN
500.0000 mg | Freq: Three times a day (TID) | INTRAVENOUS | Status: DC
  Administered 2018-04-23 – 2018-04-27 (×13): 500 mg via INTRAVENOUS
  Filled 2018-04-23 (×13): qty 100

## 2018-04-23 MED ORDER — FOLIC ACID 1 MG PO TABS
1.0000 mg | ORAL_TABLET | Freq: Every day | ORAL | Status: DC
  Administered 2018-04-23 – 2018-05-10 (×18): 1 mg via ORAL
  Filled 2018-04-23 (×18): qty 1

## 2018-04-23 MED ORDER — VITAMIN B-1 100 MG PO TABS
100.0000 mg | ORAL_TABLET | Freq: Every day | ORAL | Status: DC
  Administered 2018-04-23 – 2018-05-10 (×18): 100 mg via ORAL
  Filled 2018-04-23 (×18): qty 1

## 2018-04-23 MED ORDER — BOOST / RESOURCE BREEZE PO LIQD CUSTOM
1.0000 | Freq: Three times a day (TID) | ORAL | Status: DC
  Administered 2018-04-23 – 2018-04-25 (×4): 1 via ORAL

## 2018-04-23 MED ORDER — CIPROFLOXACIN IN D5W 400 MG/200ML IV SOLN
400.0000 mg | Freq: Once | INTRAVENOUS | Status: AC
  Administered 2018-04-23: 400 mg via INTRAVENOUS
  Filled 2018-04-23: qty 200

## 2018-04-23 MED ORDER — ADULT MULTIVITAMIN W/MINERALS CH
1.0000 | ORAL_TABLET | Freq: Every day | ORAL | Status: DC
  Administered 2018-04-23 – 2018-05-10 (×18): 1 via ORAL
  Filled 2018-04-23 (×18): qty 1

## 2018-04-23 MED ORDER — PEG 3350-KCL-NA BICARB-NACL 420 G PO SOLR: 4000.0000 mL | Freq: Once | ORAL | Status: DC

## 2018-04-23 MED ORDER — ENSURE ENLIVE PO LIQD
237.0000 mL | Freq: Two times a day (BID) | ORAL | Status: DC
  Administered 2018-04-23: 237 mL via ORAL

## 2018-04-23 NOTE — H&P (Addendum)
Family Medicine Teaching [facility] Admission History and Physical Service Pager: [phone]  Patient name: Janice Ramos Medical record number: [id number] Date of birth: 1941-07-25 Age: 77 y.o. Gender: female  Primary Care Provider: Joana Reamer, DO Consultations: None Code Status: DNR  Chief Complaint: Vomiting, diarrhea  Subjective:  Janice Ramos is a 77 y.o. female presenting with 2 months of gradually worsening vomiting and diarrhea. Patient states that she has felt unwell for 2 months that began when she started having housing insecurities for [facility] leading her to overdose and may. She thinks this was causing her initial abdominal pain and bloody bowel movements. the last 2 weeks she is gradually gotten worse.  She was seen outpatient last week and told to present to ED if worsening symptoms.  5am Monday morning, worse stomach ache in 10 years nonstop pain agonizing.  She has not been able to eat any food or keep down any fluids for about 3 days.  She feels very weak but no episodes of passing out.  She gets lightheaded with standing but tries to stand up slowly because she has chronic vertigo. has had constant diarrhea that has blood in it with also abnormal white and red substances patient describes as looking like the lining of her colon.  She is having cramping abdominal pain. she thinks she has vomited about 5 times yesterday.  She is having difficulty sleeping.  She denies any fever.  She has had this before 4 years ago with no history of C. difficile that she knows of.  She denies dysuria and states she has had decreased urine output because of decreased oral intake from vomiting.  Her last urine yesterday was dark orange.  She denies any new food exposures or sick contacts.  Has never had a colonoscopy  Review of Systems  Constitutional: Positive for chills and malaise/fatigue. Negative for diaphoresis, fever and weight loss.  HENT: Negative for  congestion.   Respiratory: Negative for cough, hemoptysis and shortness of breath.   Cardiovascular: Positive for orthopnea. Negative for leg swelling.  Gastrointestinal: Positive for abdominal pain, blood in stool, diarrhea, nausea and vomiting.  Genitourinary: Positive for hematuria. Negative for dysuria and urgency.  Musculoskeletal: Negative for neck pain.  Neurological: Positive for dizziness.  Psychiatric/Behavioral: The patient is nervous/anxious.     Patient Active Problem List   Diagnosis Date Noted  . Acute colitis 04/23/2018  . Rectal bleeding 04/15/2018  . Benzodiazepine overdose, intentional self-harm, initial encounter (HCC) 01/22/2018  . Intentional drug overdose (HCC)   . MDD (major depressive disorder), recurrent severe, without psychosis (HCC)   . Alcohol abuse with alcohol-induced mood disorder (HCC) 08/20/2017  . Eye pain 03/26/2017  . 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 Ingrown toenail 08/16/2015  . Seborrheic dermatitis of scalp 01/30/2015  . Loss of appetite 01/30/2015  . Hair loss 10/16/2014  . Lumbar pain 06/29/2014  . Trochanteric bursitis of right hip 06/21/2014  . Greater trochanteric bursitis of left hip 05/31/2014  . Healthcare maintenance 05/31/2014  . Axillary mass 05/31/2014  . 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 Right hip pain 10/10/2013  . Piriformis syndrome of right side 06/10/2013  . Syncopal episodes 04/06/2012  . Headache 12/10/2011  . Wrist pain, chronic 11/17/2011  . Thumb pain 08/06/2011  . Trochanteric bursitis 06/22/2011  . 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 Fatigue 03/26/2011  . Weight loss  03/26/2011  . Urinary incontinence 11/12/2010  . GENITAL HERPES 08/05/2010  . INSOMNIA 01/26/2009  . Pre-diabetes 12/15/2008  . DEPRESSION, MAJOR 05/17/2008  . ESSENTIAL HYPERTENSION, BENIGN 05/17/2008  .  HYPERCHOLESTEROLEMIA 11/05/2006  . BIPOLAR DISORDER 11/05/2006    Past Medical History: Past Medical History:  Diagnosis Date  . Allergy   . Bipolar 1 disorder (HCC)   . Depression   . Diabetes mellitus   . Genital herpes in women 1983  . Hyperlipidemia   . Irritable bowel syndrome (IBS)   . Overdose of benzodiazepine 06/2005   Xanax  . Sinus disorder     Past Surgical History: Past Surgical History:  Procedure Laterality Date  . CATARACT EXTRACTION    . Cataract Surgery     L eye 12/2008, R eye 05/2009 [facility]  . EYE SURGERY    . TUBAL LIGATION      Social History: Social History   Tobacco Use  . Smoking status: Never Smoker  . Smokeless tobacco: Never Used  Substance Use Topics  . Alcohol use: Yes    Alcohol/week: 0.0 standard drinks    Comment: 1 x per year  . Drug use: No   Additional social history: living in apartment alone, used to drink but has not had a drink since December.  Patient states that she makes bad decisions when she is drinking alcohol. Please also refer to relevant sections of EMR.  Family History: Family History  Problem Relation Age of Onset  . Cancer Mother        Bone  . Suicidality Father     Allergies and Medications: Allergies  Allergen Reactions  . Latex    No current facility-administered medications on file prior to encounter.    Current Outpatient Medications on File Prior to Encounter  Medication Sig Dispense Refill  . acyclovir (ZOVIRAX) 400 MG tablet Take 1 tablet (400 mg total) 5 (five) times daily by mouth. 30 tablet 0  . ALPRAZolam (XANAX) 1 MG tablet Take 1 mg by mouth 3 (three) times daily as needed for anxiety. Can take an additional tablet at bedtime for sleep.  1  . clonazePAM (KLONOPIN) 0.5 MG tablet Take 0.5 tablets (0.25 mg total) by mouth 2 (two) times daily. 30 tablet 0  . erythromycin ophthalmic ointment Place a 1/2 inch ribbon of ointment into the lower eyelid. 1 g 0  . hydrochlorothiazide  (HYDRODIURIL) 25 MG tablet Take 1 tablet (25 mg total) by mouth daily. 90 tablet 3  . hydrOXYzine (ATARAX/VISTARIL) 10 MG tablet Take 1 tablet (10 mg total) by mouth 3 (three) times daily as needed. 60 tablet 2  . lisinopril (PRINIVIL,ZESTRIL) 10 MG tablet Take 1 tablet (10 mg total) by mouth daily. 90 tablet 3  . methylphenidate (RITALIN) 20 MG tablet Take 20 mg by mouth 2 (two) times daily.  0  . mirtazapine (REMERON) 30 MG tablet TAKE 1 TABLET BY MOUTH EVERYDAY AT BEDTIME  0  . mirtazapine (REMERON) 30 MG tablet Take 1 tablet (30 mg total) by mouth at bedtime. 30 tablet 0  . tobramycin (TOBREX) 0.3 % ophthalmic solution Place 1 drop into both eyes every 6 (six) hours. 5 mL 0  . traZODone (DESYREL) 50 MG tablet Take 50 mg by mouth at bedtime. Per [facility]     . valACYclovir (VALTREX) 500 MG tablet Take 1 tablet (500 mg total) by mouth 2 (two) times daily. 6 tablet 10  . valACYclovir (VALTREX) 500 MG tablet Take 1 tablet (500  mg total) by mouth 2 (two) times daily. 30 tablet 0  . venlafaxine (EFFEXOR-XR) 150 MG 24 hr capsule Take 300 mg by mouth daily. Per [facility] on [employer]     . venlafaxine XR (EFFEXOR-XR) 150 MG 24 hr capsule Take 2 capsules (300 mg total) by mouth daily. 30 capsule 0  . vitamin C (ASCORBIC ACID) 500 MG tablet Take 500 mg by mouth daily.    Marland Kitchen. zolpidem (AMBIEN) 5 MG tablet Take 1 tablet (5 mg total) by mouth at bedtime as needed for sleep. 30 tablet 0    Assessment and Plan: Janice Ramos is a 77 y.o. female presenting with gradual onset nausea and vomiting.   Nausea and vomiting- has been continuous and worsening for 2 weeks. Was seen outpatient for this last week with stable Hgb.  Afebrile.  WBC 11.6.  Lipase normal.  Denies any sick contacts or unusual food sources. She denies any fevers. Etiology possibly infectious bacterial versus viral or inflammatory. Possibly stress-related since patient has had housing insecurities recently.   -Gastrointestinal panel stool -C. Difficile -Enteric precautions -Normal saline bolus and maintenance fluids -Cipro IV (8/16-) -Metronidazole IV (8/16-) -CBC -BMP -Urinalysis -UDS  Hematochezia- has been occurring for about 2 weeks.  Patient was seen outpatient for this 8/8 and was referred to GI. Her hemoglobin was 11.6 at that time. hemoglobin 12.2 on admission.  Fecal occult blood test positive.  CT abdomen pelvis with contrast showed wall thickening and inflammatory change in the descending and sigmoid colons most consistent with an inflammatory colitis.  No perforation is noted. She has never had a colonoscopy or seen a GI specialist. Etiology likely inflammatory, possibly infection via bacteria or virus. Less likely cancer due to acute onset. Less likely hemorrhoids since none were visualized on rectal exam 8/8 outpatient. -CT abdomen and pelvis -C. difficile test -consult GI in am -add HIV for possible lymphocytic colitis  AKI: patient endorses poor PO intake, and was taking ACEi in addition to this. Exam with hypovolemia. Will hydrate and repeat BMP.  -NS as ordered -recheck BMP   Hyponatremia: likely hypovolemic hyponatremia. Will hydrate, repeat BMP. Patient with hx of alcohol overuse, although last Na appropriate in May.  -repeat BMP  HSV-2 keratitis no new eye complaints -Continue home medication valacyclovir  Essential HTN- stable on admission with elevated blood pressures high as 175/61 since admission.  HCTZ and lisinopril on home meds -hold home BP meds 2/2 AKI -add hydralazine PRN if needed  Major depression and bipolar disorder-recent suicide attempt with benzodiazepines overdose.  On multiple psych meds at home including, methylphenidate mirtazapine clonazepam venlafaxine and zolpidem. -call pharmacy or geri psych facility to clarify medication regimen -Possible psych consult  Urinary incontinence- no home meds -Patient wears depends  Fluids/diet: 1 L normal  saline bolus, normal Prophylaxis: lovenox  Disposition: Admit to telemetry  Objective: BP 136/71   Pulse 90   Temp 100.2 F (37.9 C) (Oral)   Resp (!) 23   SpO2 100%   Exam: General: Anxious, pleasant mood, thin, nontoxic Neck: Soft, no cervical lymph node swelling Cardiovascular: Regular rate and rhythm, systolic murmur auscultated best on right sternal border Respiratory: Clear to auscultation bilaterally, no respiratory distress Neuro: Alert and oriented, normal muscle tone Abdomen: Tender to palpation diffuse, hypoactive bowel sounds Psych: Anxious, facial grimaces Renal: CVA discomfort Skin: Warm and dry, no rashes or abnormalities noted Extremities: No lower extremity swelling  Labs and Imaging: CBC BMET  Recent Labs  Lab  04/22/18 1633  WBC 11.6*  HGB 12.2  HCT 36.8  PLT 443*   Recent Labs  Lab 04/22/18 1633  NA 131*  K 3.5  CL 96*  CO2 24  BUN 26*  CREATININE 1.36*  GLUCOSE 118*  CALCIUM 8.2Leeroy Bock, DO 04/23/2018, 1:14 AM PGY-1, [facility] FPTS Intern pager: [phone], text pages welcome   FPTS Upper-Level Resident Addendum   I have independently interviewed and examined the patient. I have discussed the above with the original author and agree with their documentation. My edits for correction/addition/clarification are in blue. Please see also any attending notes.    Loni Muse, MD PGY-3, [facility] FPTS Service pager: [phone] (text pages welcome through [facility])

## 2018-04-23 NOTE — ED Notes (Signed)
Patient transported to CT scan . 

## 2018-04-23 NOTE — Consult Note (Signed)
Reason for Consult: Colitis, ABM pain, bloody diarrhea Referring Physician: Teaching Service.  Janice Ramos HPI: This is a 77 year old female with multiple medical problems admitted for a left sided colitis.  She stated that her pain was agonizing and she started to have bloody diarrhea a couple of months ago.  The patient did not see medical attention of late as she stated there were issues with her housing.  The patient was seen in the office 07/2017 for abdominal pain, nausea, vomiting, and diarrhea.  At that time she stated that her pain was a 20 out of 10.  The recommendation was for her to undergo an EGD/Colonoscopy, which she desired.  However, she stated that she was not able to undergo the procedure until 09/2017 as a result of some social issue.  She then rescheduled for 11/2017 and then cancelled.  Subsequently, she made no further contact with the office.  On admission she was noted to be anemic in the 9-10 range, but her baseline is in the 14-15 range.  She complains of severe abdominal pain.  The work up for an infectious colitis was negative.  Past Medical History:  Diagnosis Date  . Allergy   . Anxiety   . Bipolar 1 disorder (HCC)   . Depression   . Diabetes mellitus   . Diarrhea 04/2018  . Genital herpes in women 1983  . Hyperlipidemia   . Irritable bowel syndrome (IBS)   . Overdose of benzodiazepine 06/2005   Xanax  . Sinus disorder     Past Surgical History:  Procedure Laterality Date  . CATARACT EXTRACTION    . Cataract Surgery     L eye 12/2008, R eye 05/2009 [facility]  . EYE SURGERY    . TUBAL LIGATION      Family History  Problem Relation Age of Onset  . Cancer Mother        Bone  . Suicidality Father     Social History:  reports that she has never smoked. She has never used smokeless tobacco. She reports that she drinks alcohol. She reports that she does not use drugs.  Allergies:  Allergies  Allergen Reactions  . Latex Hives    Medications:   Scheduled: . feeding supplement  1 Container Oral TID BM  . feeding supplement (PRO-STAT SUGAR FREE 64)  30 mL Oral Daily  . mirtazapine  30 mg Oral QHS  . valACYclovir  500 mg Oral BID   Continuous: . sodium chloride 100 mL/hr at 04/23/18 1436  . cefTRIAXone (ROCEPHIN)  IV 2 g (04/23/18 1550)  . metronidazole 500 mg (04/23/18 1435)    Results for orders placed or performed during the hospital encounter of 04/22/18 (from the past 24 hour(s))  POC occult blood, ED Provider will collect     Status: Abnormal   Collection Time: 04/22/18 11:48 PM  Result Value Ref Range   Fecal Occult Bld POSITIVE (A) NEGATIVE  Gastrointestinal Panel by PCR , Stool     Status: None   Collection Time: 04/23/18  6:02 AM  Result Value Ref Range   Campylobacter species NOT DETECTED NOT DETECTED   Plesimonas shigelloides NOT DETECTED NOT DETECTED   Salmonella species NOT DETECTED NOT DETECTED   Yersinia enterocolitica NOT DETECTED NOT DETECTED   Vibrio species NOT DETECTED NOT DETECTED   Vibrio cholerae NOT DETECTED NOT DETECTED   Enteroaggregative E coli (EAEC) NOT DETECTED NOT DETECTED   Enteropathogenic E coli (EPEC) NOT DETECTED NOT DETECTED  Enterotoxigenic E coli (ETEC) NOT DETECTED NOT DETECTED   Shiga like toxin producing E coli (STEC) NOT DETECTED NOT DETECTED   Shigella/Enteroinvasive E coli (EIEC) NOT DETECTED NOT DETECTED   Cryptosporidium NOT DETECTED NOT DETECTED   Cyclospora cayetanensis NOT DETECTED NOT DETECTED   Entamoeba histolytica NOT DETECTED NOT DETECTED   Giardia lamblia NOT DETECTED NOT DETECTED   Adenovirus F40/41 NOT DETECTED NOT DETECTED   Astrovirus NOT DETECTED NOT DETECTED   Norovirus GI/GII NOT DETECTED NOT DETECTED   Rotavirus A NOT DETECTED NOT DETECTED   Sapovirus (I, II, IV, and V) NOT DETECTED NOT DETECTED  C difficile quick scan w PCR reflex     Status: None   Collection Time: 04/23/18  6:02 AM  Result Value Ref Range   C Diff antigen NEGATIVE NEGATIVE   C  Diff toxin NEGATIVE NEGATIVE   C Diff interpretation No C. difficile detected.   CBC     Status: Abnormal   Collection Time: 04/23/18  6:27 AM  Result Value Ref Range   WBC 9.8 4.0 - 10.5 K/uL   RBC 3.22 (L) 3.87 - 5.11 MIL/uL   Hemoglobin 9.9 (L) 12.0 - 15.0 g/dL   HCT 16.130.7 (L) 09.636.0 - 04.546.0 %   MCV 95.3 78.0 - 100.0 fL   MCH 30.7 26.0 - 34.0 pg   MCHC 32.2 30.0 - 36.0 g/dL   RDW 40.913.8 81.111.5 - 91.415.5 %   Platelets 368 150 - 400 K/uL  Basic metabolic panel     Status: Abnormal   Collection Time: 04/23/18  6:27 AM  Result Value Ref Range   Sodium 134 (L) 135 - 145 mmol/L   Potassium 3.5 3.5 - 5.1 mmol/L   Chloride 105 98 - 111 mmol/L   CO2 18 (L) 22 - 32 mmol/L   Glucose, Bld 95 70 - 99 mg/dL   BUN 23 8 - 23 mg/dL   Creatinine, Ser 7.821.02 (H) 0.44 - 1.00 mg/dL   Calcium 7.3 (L) 8.9 - 10.3 mg/dL   GFR calc non Af Amer 52 (L) >60 mL/min   GFR calc Af Amer >60 >60 mL/min   Anion gap 11 5 - 15  Type and screen [facility]     Status: None   Collection Time: 04/23/18 12:33 PM  Result Value Ref Range   ABO/RH(D) O POS    Antibody Screen NEG    Sample Expiration      04/26/2018 Performed at [facility], [address] [place], [place] [address]   ABO/Rh     Status: None   Collection Time: 04/23/18 12:33 PM  Result Value Ref Range   ABO/RH(D)      O POS Performed at [facility], [address] [place], [place] [address]   CBC     Status: Abnormal   Collection Time: 04/23/18  2:58 PM  Result Value Ref Range   WBC 9.2 4.0 - 10.5 K/uL   RBC 3.29 (L) 3.87 - 5.11 MIL/uL   Hemoglobin 10.2 (L) 12.0 - 15.0 g/dL   HCT 78.430.7 (L) 69.636.0 - 29.546.0 %   MCV 93.3 78.0 - 100.0 fL   MCH 31.0 26.0 - 34.0 pg   MCHC 33.2 30.0 - 36.0 g/dL   RDW 28.413.7 13.211.5 - 44.015.5 %   Platelets 399 150 - 400 K/uL  Valproic acid level     Status: Abnormal   Collection Time: 04/23/18  2:58 PM  Result Value Ref Range  Valproic Acid Lvl <10 (L) 50.0 - 100.0 ug/mL     Ct Abdomen Pelvis W  Contrast  Result Date: 04/23/2018 CLINICAL DATA:  Lower abdominal pain and bloody diarrhea for 1 week EXAM: CT ABDOMEN AND PELVIS WITH CONTRAST TECHNIQUE: Multidetector CT imaging of the abdomen and pelvis was performed using the standard protocol following bolus administration of intravenous contrast. CONTRAST:  80mL OMNIPAQUE IOHEXOL 300 MG/ML  SOLN COMPARISON:  None. FINDINGS: Lower chest: No acute abnormality. Hepatobiliary: Scattered cystic lesions are again identified throughout the liver. No focal mass is noted. The gallbladder is within normal limits. Pancreas: Unremarkable. No pancreatic ductal dilatation or surrounding inflammatory changes. Spleen: Normal in size without focal abnormality. Adrenals/Urinary Tract: The adrenal glands are unremarkable. No renal calculi or obstructive changes are seen. The bladder is partially distended. Stomach/Bowel: There is diffuse wall thickening of the descending and sigmoid colon. This is likely inflammatory in nature and would correspond with the patient's given clinical history of bloody diarrhea. The appendix is not well visualized although no inflammatory changes are seen. No obstructive changes are noted. Vascular/Lymphatic: Aortic atherosclerosis. No enlarged abdominal or pelvic lymph nodes. Reproductive: Uterus and bilateral adnexa are unremarkable. Other: No abdominal wall hernia or abnormality. No abdominopelvic ascites. Musculoskeletal: No acute bony abnormality is noted. IMPRESSION: Diffuse wall thickening inflammatory change in the descending and sigmoid colons most consistent with an inflammatory colitis. No perforation is noted. No other acute abnormality is noted. Electronically Signed   By: Alcide CleverMark  Lukens M.D.   On: 04/23/2018 00:51    ROS:  As stated above in the HPI otherwise negative.  Blood pressure (!) 124/58, pulse 96, temperature 99.1 F (37.3 C), temperature source Oral, resp. rate 19, height 5\' 2"  (1.575 m), weight 59 kg, SpO2 100 %.     PE: Gen: Alert and Oriented, agitated - Unable to perform a physical examination HEENT:  [place]/AT, EOMI ABM: Diffusely tender, heating pad - per patient's description and observation  Assessment/Plan: 1) Left sided colitis. 2) Hematochezia. 3) Anemia. 4) ABM pain.   The patient declines all procedures at this time.  She wants her issue to improve or to resolve, however, the etiology cannot be identified without a procedure.  This was explained to her in detail, but she did not change her mind.  She was not even amenable to a FFS, which would most likely be able to diagnose the issue.  Plan: 1) Discontinue ceftriaxone as there is no evidence 2) Continue supportive care. 3) A colonoscopy is recommended.  Ellicia Alix D 04/23/2018, 5:35 PM

## 2018-04-23 NOTE — Progress Notes (Signed)
Initial Nutrition Assessment  DOCUMENTATION CODES:   Not applicable  INTERVENTION:  Discontinue Ensure Enlive po BID, each supplement provides 350 kcal and 20 grams of protein.  Provide Boost Breeze po TID, each supplement provides 250 kcal and 9 grams of protein.  Provide 30 ml Prostat po once daily, each supplement provides 100 kcal and 15 grams of protein.   Encourage adequate PO intake.   NUTRITION DIAGNOSIS:   Inadequate oral intake related to altered GI function as evidenced by meal completion < 50%.  GOAL:   Patient will meet greater than or equal to 90% of their needs  MONITOR:   PO intake, Supplement acceptance, Labs, Weight trends, I & O's, Skin  REASON FOR ASSESSMENT:   Malnutrition Screening Tool    ASSESSMENT:   77 y.o. female with history of diabetes and IBS presenting with 2 months of gradually worsening vomiting and diarrhea.  CT abdomen pelvis with contrast showed wall thickening and inflammatory change in the descending and sigmoid colons most consistent with an inflammatory colitis.   Meal completion has been 10-40%. Pt reports abdominal pain during time of visit. She report poor po over the past 2 months and usual intake of only taking a couple of bites of food at meals with 1 cup of kefir daily. Pt does report consume Ensure at home however it has only been on occasion when she has them available in the house. Pt with no significant weight loss per weight records. Pt currently has Ensure ordered and reports it has been causing abdominal discomfort. Pt agreeable to alternative supplements. RD to order Boost Breeze and Prostat to aid in caloric and protein needs. Pt encouraged to eat her food at meals and to drink her supplements.   NUTRITION - FOCUSED PHYSICAL EXAM:  Depletion may be related to the natural aging process.     Most Recent Value  Orbital Region  Unable to assess  Upper Arm Region  No depletion  Thoracic and Lumbar Region  No depletion   Buccal Region  Unable to assess  Temple Region  Unable to assess  Clavicle Bone Region  Moderate depletion  Clavicle and Acromion Bone Region  Moderate depletion  Scapular Bone Region  Unable to assess  Dorsal Hand  Unable to assess  Patellar Region  No depletion  Anterior Thigh Region  No depletion  Posterior Calf Region  No depletion  Edema (RD Assessment)  None  Hair  Reviewed  Eyes  Reviewed  Mouth  Reviewed  Skin  Reviewed  Nails  Reviewed       Diet Order:   Diet Order            Diet Heart Room service appropriate? Yes; Fluid consistency: Thin  Diet effective now              EDUCATION NEEDS:   Not appropriate for education at this time  Skin:  Skin Assessment: Reviewed RN Assessment  Last BM:  8/15  Height:   Ht Readings from Last 1 Encounters:  04/23/18 5\' 2"  (1.575 m)    Weight:   Wt Readings from Last 1 Encounters:  04/23/18 59 kg    Ideal Body Weight:  50 kg  BMI:  Body mass index is 23.79 kg/m.  Estimated Nutritional Needs:   Kcal:  1550-1750  Protein:  70-85 grams  Fluid:  >/= 1.5 L/day    Roslyn SmilingStephanie Dalan Cowger, MS, RD, LDN Pager # [phone] After hours/ weekend pager # [phone]

## 2018-04-23 NOTE — Progress Notes (Signed)
Patient arrived to the unit via bed from the emergency department. Patient is alert and oriented x 4.  Skin assessment complete.  IV intact to the left antecubital.  No skin issues.  Educated the patient on how to reach the staff on the unit.  Lowered the bed, activated the bed alarm and ensured the call light was within reach.  Will continue to monitor the patient

## 2018-04-23 NOTE — Progress Notes (Signed)
Pt's temp noted to be 103. RN notified MD. MD ordered to administer tylenol and recheck temp in 3 hrs. Pt currently asymptomatic, showing no signs of distress. RN to continue to monitor.

## 2018-04-23 NOTE — Progress Notes (Signed)
Patient has abdominal pain 10/10.  No prn pain meds.  Notified on call triad.  Will continue to monitor the patient

## 2018-04-23 NOTE — Plan of Care (Signed)
Pt in bed resting comfortable in no apparent distress

## 2018-04-24 ENCOUNTER — Encounter (HOSPITAL_COMMUNITY)
Admission: EM | Disposition: A | Discharge status: Skilled Nursing Facility | Payer: Self-pay | Source: Home / Self Care | Attending: Family Medicine

## 2018-04-24 DIAGNOSIS — R933 Abnormal findings on diagnostic imaging of other parts of digestive tract: Secondary | ICD-10-CM

## 2018-04-24 DIAGNOSIS — K625 Hemorrhage of anus and rectum: Secondary | ICD-10-CM

## 2018-04-24 DIAGNOSIS — K921 Melena: Secondary | ICD-10-CM | POA: Insufficient documentation

## 2018-04-24 DIAGNOSIS — R197 Diarrhea, unspecified: Secondary | ICD-10-CM

## 2018-04-24 DIAGNOSIS — R1084 Generalized abdominal pain: Secondary | ICD-10-CM

## 2018-04-24 DIAGNOSIS — R110 Nausea: Secondary | ICD-10-CM

## 2018-04-24 LAB — BASIC METABOLIC PANEL
Anion gap: 9 (ref 5–15)
BUN: 16 mg/dL (ref 8–23)
CO2: 24 mmol/L (ref 22–32)
CREATININE: 0.99 mg/dL (ref 0.44–1.00)
Calcium: 8 mg/dL — ABNORMAL LOW (ref 8.9–10.3)
Chloride: 108 mmol/L (ref 98–111)
GFR calc Af Amer: >60 mL/min (ref >60)
GFR, EST NON AFRICAN AMERICAN: 54 mL/min — AB (ref >60)
Glucose, Bld: 111 mg/dL — ABNORMAL HIGH (ref 70–99)
POTASSIUM: 2.9 mmol/L — AB (ref 3.5–5.1)
SODIUM: 141 mmol/L (ref 135–145)

## 2018-04-24 LAB — CBC
HCT: 33.7 % — ABNORMAL LOW (ref 36.0–46.0)
Hemoglobin: 11 g/dL — ABNORMAL LOW (ref 12.0–15.0)
MCH: 31.1 pg (ref 26.0–34.0)
MCHC: 32.6 g/dL (ref 30.0–36.0)
MCV: 95.2 fL (ref 78.0–100.0)
PLATELETS: 446 10*3/uL — AB (ref 150–400)
RBC: 3.54 MIL/uL — AB (ref 3.87–5.11)
RDW: 14.1 % (ref 11.5–15.5)
WBC: 10.2 10*3/uL (ref 4.0–10.5)

## 2018-04-24 LAB — HIV ANTIBODY (ROUTINE TESTING W REFLEX): HIV Screen 4th Generation wRfx: NONREACTIVE

## 2018-04-24 SURGERY — ESOPHAGOGASTRODUODENOSCOPY (EGD) WITH PROPOFOL
Anesthesia: Monitor Anesthesia Care

## 2018-04-24 MED ORDER — POTASSIUM CHLORIDE CRYS ER 20 MEQ PO TBCR
40.0000 meq | EXTENDED_RELEASE_TABLET | Freq: Two times a day (BID) | ORAL | Status: AC
  Administered 2018-04-24 (×2): 40 meq via ORAL
  Filled 2018-04-24 (×2): qty 2

## 2018-04-24 MED ORDER — DICYCLOMINE HCL 10 MG PO CAPS
10.0000 mg | ORAL_CAPSULE | Freq: Four times a day (QID) | ORAL | Status: DC | PRN
  Filled 2018-04-24: qty 1

## 2018-04-24 MED ORDER — DICYCLOMINE HCL 10 MG PO CAPS
10.0000 mg | ORAL_CAPSULE | Freq: Three times a day (TID) | ORAL | Status: DC
  Administered 2018-04-24 – 2018-04-27 (×11): 10 mg via ORAL
  Filled 2018-04-24 (×13): qty 1

## 2018-04-24 NOTE — Progress Notes (Addendum)
Patient ID: Janice Ramos, female   DOB: 1940-09-27, 77 y.o.   MRN: [id number]   GI Progress Note Covering for Dr. Elnoria HowardHung   Subjective  Pt sitting on bedside commode - continued pain and cramping- says stool dark, not grossly bloody     Objective   Vital signs in last 24 hours: Temp:  [98.1 F (36.7 C)-103 F (39.4 C)] 100.6 F (38.1 C) (08/17 0523) Pulse Rate:  [78-96] 83 (08/17 0523) Resp:  [16-19] 17 (08/17 0523) BP: (106-128)/(58-61) 128/61 (08/17 0523) SpO2:  [96 %-100 %] 97 % (08/17 0523) Last BM Date: 04/22/18 General:  elderly white female in NAD Heart:  Regular rate and rhythm; no murmurs Lungs: Respirations even and unlabored, lungs CTA bilaterally Abdomen:  Soft, tender and nondistended. Normal bowel sounds. Extremities:  Without edema. Neurologic:  Alert and oriented,  grossly normal neurologically. Psych:  Cooperative. Normal mood and affect.  Intake/Output from previous day: 08/16 0701 - 08/17 0700 In: 1674.9 [P.O.:475; I.V.:899.9; IV Piggyback:300] Out: -  Intake/Output this shift: No intake/output data recorded.  Lab Results: Recent Labs    04/23/18 0627 04/23/18 1458 04/24/18 0350  WBC 9.8 9.2 10.2  HGB 9.9* 10.2* 11.0*  HCT 30.7* 30.7* 33.7*  PLT 368 399 446*   BMET Recent Labs    04/22/18 1633 04/23/18 0627 04/24/18 0350  NA 131* 134* 141  K 3.5 3.5 2.9*  CL 96* 105 108  CO2 24 18* 24  GLUCOSE 118* 95 111*  BUN 26* 23 16  CREATININE 1.36* 1.02* 0.99  CALCIUM 8.2* 7.3* 8.0*   LFT Recent Labs    04/22/18 1633  PROT 6.9  ALBUMIN 2.7*  AST 30  ALT 22  ALKPHOS 58  BILITOT 0.9   PT/INR No results for input(s): LABPROT, INR in the last 72 hours.  Studies/Results: Ct Abdomen Pelvis W Contrast  Result Date: 04/23/2018 CLINICAL DATA:  Lower abdominal pain and bloody diarrhea for 1 week EXAM: CT ABDOMEN AND PELVIS WITH CONTRAST TECHNIQUE: Multidetector CT imaging of the abdomen and pelvis was performed using the standard protocol  following bolus administration of intravenous contrast. CONTRAST:  80mL OMNIPAQUE IOHEXOL 300 MG/ML  SOLN COMPARISON:  None. FINDINGS: Lower chest: No acute abnormality. Hepatobiliary: Scattered cystic lesions are again identified throughout the liver. No focal mass is noted. The gallbladder is within normal limits. Pancreas: Unremarkable. No pancreatic ductal dilatation or surrounding inflammatory changes. Spleen: Normal in size without focal abnormality. Adrenals/Urinary Tract: The adrenal glands are unremarkable. No renal calculi or obstructive changes are seen. The bladder is partially distended. Stomach/Bowel: There is diffuse wall thickening of the descending and sigmoid colon. This is likely inflammatory in nature and would correspond with the patient's given clinical history of bloody diarrhea. The appendix is not well visualized although no inflammatory changes are seen. No obstructive changes are noted. Vascular/Lymphatic: Aortic atherosclerosis. No enlarged abdominal or pelvic lymph nodes. Reproductive: Uterus and bilateral adnexa are unremarkable. Other: No abdominal wall hernia or abnormality. No abdominopelvic ascites. Musculoskeletal: No acute bony abnormality is noted. IMPRESSION: Diffuse wall thickening inflammatory change in the descending and sigmoid colons most consistent with an inflammatory colitis. No perforation is noted. No other acute abnormality is noted. Electronically Signed   By: Alcide CleverMark  Lukens M.D.   On: 04/23/2018 00:51      Assessment / Plan:     #1 77 yo female with one week hx of abdominal pain, nausea, diarrhea, and some rectal bleeding  CTt is consistent with acute left sided  colitis - etiology not clear - ? segmental ischemic colitis , r/o UC or Crohn's She has had previous c/o abdominal pain/ nausea etc- had declined EGD and Colon when seen by Dr Elnoria HowardHung outpt 11/18   Recommendations: Continue clears, IV fluids  Add Bentyl 10 mg qid cramping Pt considering  Colonoscopy/Flex   Active Problems:   Acute colitis    LOS: 1 day   Amy Esterwood  04/24/2018, 1:26 PM      Attending physician's note   I have taken an interval history, reviewed the chart and examined the patient. I agree with the Advanced Practitioner's note, impression and recommendations.   Claudette HeadMalcolm Carrah Eppolito, MD FACG [phone] office

## 2018-04-24 NOTE — Progress Notes (Signed)
Family Medicine Teaching Service Daily Progress Note Intern Pager: [phone]  Patient name: Janice Ramos Medical record number: [id number] Date of birth: [birth year] Age: 77 y.o. Gender: female  Primary Care Provider: Joana ReamerMullis, Kiersten P, DO Consultants: Gastroenterology Code Status: Full  Pt Overview and Major Events to Date:  8/16 - admitted for worsening vomiting and diarrhea  Assessment and Plan: Janice Ramos is a 77 y.o. female presenting with gradual onset nausea and vomiting.  Inflammatory colitis with hematochezia Visualized on CT Abd/pelvis with nausea and vomiting persistent since admission. Attempted golytely clean out last night and could not tolerate due to persistent vomiting and diarrhea. GI consulted and recommended colonoscopy for today however patient declined due to persistent symptoms. Wants to be advanced to clear liquids and attempt scope at a later date. Denies seeing [facility] currently however is reporting dark loose stools. Cdiff and GI panel negative. Hb 12.2>>11 this am, stable since yesterday. GI recommends supportive care until patient amenable to proceed with colonoscopy which will be integral in assessing etiology. - advance diet to clear liquids - s/p cipro, CTX (8/16) - continue flagyl (8/16 - ) - NS @ 11900ml/hr - GI following, appreciate recommendations - f/u HIV  AKI, resolved Cr 1.36> 0.99 this am. S/p IV bolus, maintains on mIVF due to NPO status overnight. Advanced to clear liquids this am.   Hyponatremia, resolved Likely hypovolemic hyponatremia as resolved s/p IV hydration.   - monitor on BMP  HSV-2 keratitis  No new eye complaints although reports genital outbreak. Currently on treatment dosing. -Continue home medication valacyclovir  Essential HTN- Stable.128/61 this am with holding home BP meds. On HCTZ and lisinopril at home. -hold home BP meds 2/2 AKI  Major depression and bipolar disorder- Stable. No mood instability this am.  Multiple psych meds on home med list including methylphenidate, mirtazapine, clonazepam, venlafaxine and zolpidem. Clarified home regimen with outpatient pharmacy shortly after admission.  - continue home mirtazapine with atarax PRN for anxiety - monitor  Urinary incontinence- no home meds -Patient wears depends  FEN/GI: clear liquids PPx: lovenox  Disposition: continue inpatient management of inflammatory colitis  Subjective:  Attempted golytely clean out last night but did not tolerate due to persistent vomiting. Does not want to proceed with colonoscopy today due to pain. Wants to drink ginger ale.  Objective: Temp:  [98.1 F (36.7 C)-103 F (39.4 C)] 100.6 F (38.1 C) (08/17 0523) Pulse Rate:  [78-96] 83 (08/17 0523) Resp:  [16-19] 17 (08/17 0523) BP: (106-128)/(58-61) 128/61 (08/17 0523) SpO2:  [96 %-100 %] 97 % (08/17 0523) Physical Exam: General: elderly female lying in bed, in NAD Cardiovascular: RRR, no murmur Respiratory: CTAB, no wheezing/rales Abdomen: soft, slightly distended. Tender to light palpation diffusely, worse in lower quadrants. Hyperactive BS. Extremities: warm and well perfused  Laboratory: Recent Labs  Lab 04/23/18 0627 04/23/18 1458 04/24/18 0350  WBC 9.8 9.2 10.2  HGB 9.9* 10.2* 11.0*  HCT 30.7* 30.7* 33.7*  PLT 368 399 446*   Recent Labs  Lab 04/22/18 1633 04/23/18 0627 04/24/18 0350  NA 131* 134* 141  K 3.5 3.5 2.9*  CL 96* 105 108  CO2 24 18* 24  BUN 26* 23 16  CREATININE 1.36* 1.02* 0.99  CALCIUM 8.2* 7.3* 8.0*  PROT 6.9  --   --   BILITOT 0.9  --   --   ALKPHOS 58  --   --   ALT 22  --   --   AST 30  --   --  GLUCOSE 118* 95 111*   Imaging/Diagnostic Tests: No results found.  Ellwood DenseRumball, Alison, DO 04/24/2018, 10:57 AM PGY-2, [facility] FPTS Intern pager: [phone], text pages welcome

## 2018-04-24 NOTE — Plan of Care (Signed)
POC: No acute events this shift. Pt NPO since midnight. N/V, and diarrhea X 1. VSS, febrile X 1 (PRN tylenol administered). Skin integrity maintained, no new skin issues noted.  Fall prevention protocol maintained, no fall episode noted. POC reviewed with patient.

## 2018-04-24 NOTE — Progress Notes (Deleted)
Potassium level noted to be 2.9 with a.m. labs. MD notified. Waiting for new orders. Pt currently alert, showing no signs of distress. RN to continue to monitor.

## 2018-04-24 NOTE — Plan of Care (Signed)
Pt in bed resting comfortably. No distress noted.

## 2018-04-25 LAB — BASIC METABOLIC PANEL
Anion gap: 8 (ref 5–15)
BUN: 16 mg/dL (ref 8–23)
CHLORIDE: 117 mmol/L — AB (ref 98–111)
CO2: 20 mmol/L — ABNORMAL LOW (ref 22–32)
CREATININE: 0.9 mg/dL (ref 0.44–1.00)
Calcium: 7.9 mg/dL — ABNORMAL LOW (ref 8.9–10.3)
GFR calc Af Amer: >60 mL/min (ref >60)
GFR calc non Af Amer: >60 mL/min (ref >60)
GLUCOSE: 135 mg/dL — AB (ref 70–99)
POTASSIUM: 4 mmol/L (ref 3.5–5.1)
SODIUM: 145 mmol/L (ref 135–145)

## 2018-04-25 LAB — CBC
HEMATOCRIT: 33 % — AB (ref 36.0–46.0)
Hemoglobin: 10.7 g/dL — ABNORMAL LOW (ref 12.0–15.0)
MCH: 30.2 pg (ref 26.0–34.0)
MCHC: 32.4 g/dL (ref 30.0–36.0)
MCV: 93.2 fL (ref 78.0–100.0)
PLATELETS: 486 10*3/uL — AB (ref 150–400)
RBC: 3.54 MIL/uL — ABNORMAL LOW (ref 3.87–5.11)
RDW: 14.4 % (ref 11.5–15.5)
WBC: 10.2 10*3/uL (ref 4.0–10.5)

## 2018-04-25 MED ORDER — PEG-KCL-NACL-NASULF-NA ASC-C 100 G PO SOLR: 1.0000 | Freq: Once | ORAL | Status: DC

## 2018-04-25 MED ORDER — PEG-KCL-NACL-NASULF-NA ASC-C 100 G PO SOLR
0.5000 | Freq: Once | ORAL | Status: AC
  Administered 2018-04-25: 100 g via ORAL

## 2018-04-25 MED ORDER — SODIUM CHLORIDE 0.9 % IV SOLN: INTRAVENOUS | Status: DC | PRN

## 2018-04-25 MED ORDER — PEG-KCL-NACL-NASULF-NA ASC-C 100 G PO SOLR
0.5000 | Freq: Once | ORAL | Status: AC
  Administered 2018-04-25: 100 g via ORAL
  Filled 2018-04-25: qty 1

## 2018-04-25 NOTE — Progress Notes (Signed)
  GI Progress Note covering for Dr. Elnoria HowardHung   Subjective  Continued abdominal pain, cramping, diarrhea   Objective  Vital signs in last 24 hours: Temp:  [98 F (36.7 C)-99.1 F (37.3 C)] 98.1 F (36.7 C) (08/18 0415) Pulse Rate:  [82-96] 96 (08/18 0415) Resp:  [16-18] 18 (08/18 0415) BP: (122-153)/(76-88) 153/88 (08/18 0415) SpO2:  [97 %-100 %] 99 % (08/18 0415) Last BM Date: 04/24/18  General: Alert, well-developed, in NAD Heart:  Regular rate and rhythm; no murmurs Chest: Clear to ascultation bilaterally Abdomen:  Soft, mild diffuse tenderness and nondistended. Normal bowel sounds, without guarding, and without rebound.   Extremities:  Without edema. Neurologic:  Alert and  oriented x4; grossly normal neurologically. Psych:  Alert and cooperative. Normal mood and affect.  Intake/Output from previous day: 08/17 0701 - 08/18 0700 In: 4037.7 [P.O.:100; I.V.:2925.2; IV Piggyback:1012.5] Out: -  Intake/Output this shift: No intake/output data recorded.  Lab Results: Recent Labs    04/23/18 1458 04/24/18 0350 04/25/18 0906  WBC 9.2 10.2 10.2  HGB 10.2* 11.0* 10.7*  HCT 30.7* 33.7* 33.0*  PLT 399 446* 486*   BMET Recent Labs    04/23/18 0627 04/24/18 0350 04/25/18 0424  NA 134* 141 145  K 3.5 2.9* 4.0  CL 105 108 117*  CO2 18* 24 20*  GLUCOSE 95 111* 135*  BUN 23 16 16   CREATININE 1.02* 0.99 0.90  CALCIUM 7.3* 8.0* 7.9*   LFT Recent Labs    04/22/18 1633  PROT 6.9  ALBUMIN 2.7*  AST 30  ALT 22  ALKPHOS 58  BILITOT 0.9    Studies/Results: No results found.    Assessment & Plan   1. Abdominal pain, diarrhea, hematochezia, abnormal CT of the colon. Stool studies all negative. Continue dicyclomine 10 mg ac, hs. She is agreeable to proceed with colonoscopy tomorrow and bowel prep today. Schedule colonoscopy for Monday with Dr. Elnoria HowardHung.     LOS: 2 days   Judie PetitMalcolm T. Russella DarStark MD 04/25/2018, 9:56 AM

## 2018-04-25 NOTE — H&P (View-Only) (Signed)
  GI Progress Note covering for Dr. Hung   Subjective  Continued abdominal pain, cramping, diarrhea   Objective  Vital signs in last 24 hours: Temp:  [98 F (36.7 C)-99.1 F (37.3 C)] 98.1 F (36.7 C) (08/18 0415) Pulse Rate:  [82-96] 96 (08/18 0415) Resp:  [16-18] 18 (08/18 0415) BP: (122-153)/(76-88) 153/88 (08/18 0415) SpO2:  [97 %-100 %] 99 % (08/18 0415) Last BM Date: 04/24/18  General: Alert, well-developed, in NAD Heart:  Regular rate and rhythm; no murmurs Chest: Clear to ascultation bilaterally Abdomen:  Soft, mild diffuse tenderness and nondistended. Normal bowel sounds, without guarding, and without rebound.   Extremities:  Without edema. Neurologic:  Alert and  oriented x4; grossly normal neurologically. Psych:  Alert and cooperative. Normal mood and affect.  Intake/Output from previous day: 08/17 0701 - 08/18 0700 In: 4037.7 [P.O.:100; I.V.:2925.2; IV Piggyback:1012.5] Out: -  Intake/Output this shift: No intake/output data recorded.  Lab Results: Recent Labs    04/23/18 1458 04/24/18 0350 04/25/18 0906  WBC 9.2 10.2 10.2  HGB 10.2* 11.0* 10.7*  HCT 30.7* 33.7* 33.0*  PLT 399 446* 486*   BMET Recent Labs    04/23/18 0627 04/24/18 0350 04/25/18 0424  NA 134* 141 145  K 3.5 2.9* 4.0  CL 105 108 117*  CO2 18* 24 20*  GLUCOSE 95 111* 135*  BUN 23 16 16  CREATININE 1.02* 0.99 0.90  CALCIUM 7.3* 8.0* 7.9*   LFT Recent Labs    04/22/18 1633  PROT 6.9  ALBUMIN 2.7*  AST 30  ALT 22  ALKPHOS 58  BILITOT 0.9    Studies/Results: No results found.    Assessment & Plan   1. Abdominal pain, diarrhea, hematochezia, abnormal CT of the colon. Stool studies all negative. Continue dicyclomine 10 mg ac, hs. She is agreeable to proceed with colonoscopy tomorrow and bowel prep today. Schedule colonoscopy for Monday with Dr. Hung.     LOS: 2 days   Costas Sena T. Didier Brandenburg MD 04/25/2018, 9:56 AM 

## 2018-04-25 NOTE — Progress Notes (Addendum)
Family Medicine Teaching Service Daily Progress Note Intern Pager: [phone]  Patient name: Janice Ramos Medical record number: [id number] Date of birth: 08/31/1941 Age: 77 y.o. Gender: female  Primary Care Provider: Joana ReamerMullis, Kiersten P, DO Consultants: Gastroenterology Code Status: Full  Pt Overview and Major Events to Date:  8/16 - admitted for worsening vomiting and diarrhea  Assessment and Plan: Janice Dandith C Berroa is a 77 y.o. female presenting with gradual onset nausea and vomiting.   Inflammatory colitis with hematochezia GI consulted and recommended colonoscopy however pt declined golytely 8/17 due to persistent symptoms. On clear liquid diet for now, pt tolerating po this morning and agreeable to reattempt golytely prep. Denies BRBPR but has had ongoing dark loose stools concerning for blood loss. AM CBC with Hgb 10.7, stable from 11 yesterday. Cdiff and GI panel negative.  - Plan discussed with Dr. Russella DarStark ( GI), will keep clear liquid diet for now > golytely tonight.  [facility] with plan for scope tomorrow.  - continue flagyl (8/16 - ) - vitals per unit routine   Acute Anemia, likely due to blood loss from colitis. Hold heparin/enoxaparin. Monitor for hemodynamic instability.   Fever, likely related to colitis, differential includes bacteremia, UTI. No signs of pneumonia. Will discontinue Flagyl after 5 days. Continue to follow cultures.   HSV-2 keratitis  No new eye complaints although reports genital outbreak. Currently on treatment dosing. -Continue home medication valacyclovir  Essential HTN- BP 153/88 this am, holding home HCTZ and lisinopril due to AKI on admission.   - AKI has resolved, can likely restart home meds tomorrow as she will go for scope tomorrow AM  - monitor BP  Major depression and bipolar disorder- Stable. No mood instability this am. Multiple psych meds on home med list including methylphenidate, mirtazapine, clonazepam,  venlafaxine and zolpidem. Clarified home regimen with outpatient pharmacy shortly after admission.  - continue home mirtazapine with atarax PRN for anxiety  - monitor  Urinary incontinence- no home meds -Patient wears depends   AKI, resolved Cr 1.36>0.90 this am. No IVF as she is tolerating clears.  -monitor daily bmet    Hyponatremia, resolved Likely hypovolemic hyponatremia as resolved s/p IV hydration.   - monitor on BMP  FEN/GI: clear liquids > golytely tonight, NPO at [facility]  PPx: lovenox  Disposition: plan for scope 8/19   Subjective:  Patient reports dark stools with diarrhea.  Last emesis was yesterday morning, she is now tolerating clears po.  Agreeable to trying golytely today followed by colonoscopy tomorrow.   Objective: Temp:  [98 F (36.7 C)-99.1 F (37.3 C)] 98.1 F (36.7 C) (08/18 0415) Pulse Rate:  [82-96] 96 (08/18 0415) Resp:  [16-18] 18 (08/18 0415) BP: (122-153)/(76-88) 153/88 (08/18 0415) SpO2:  [97 %-100 %] 99 % (08/18 0415)   Physical Exam: General: pleasant 77 yo female lying in bed, in NAD  Cardiovascular: RRR, no MRG Respiratory: CTAB, no wheezing/rales Abdomen: soft, slightly distended, +bs  Extremities: warm and well perfused  Laboratory: Recent Labs  Lab 04/23/18 1458 04/24/18 0350 04/25/18 0906  WBC 9.2 10.2 10.2  HGB 10.2* 11.0* 10.7*  HCT 30.7* 33.7* 33.0*  PLT 399 446* 486*   Recent Labs  Lab 04/22/18 1633 04/23/18 0627 04/24/18 0350 04/25/18 0424  NA 131* 134* 141 145  K 3.5 3.5 2.9* 4.0  CL 96* 105 108 117*  CO2 24 18* 24 20*  BUN 26* 23 16 16   CREATININE 1.36* 1.02* 0.99 0.90  CALCIUM 8.2*  7.3* 8.0* 7.9*  PROT 6.9  --   --   --   BILITOT 0.9  --   --   --   ALKPHOS 58  --   --   --   ALT 22  --   --   --   AST 30  --   --   --   GLUCOSE 118* 95 111* 135*   Imaging/Diagnostic Tests: No results found.  Freddrick MarchAmin, Yashika, MD 04/25/2018, 12:20 PM PGY-3, [facility] FPTS Intern pager: [phone], text  pages welcome  FMTS Attending Daily Note: Terisa Starrarina Sael Furches, MD  Pager [phone]  Office [phone] I have seen and examined this patient, reviewed their chart. I have discussed this patient with the resident. I agree with the resident's findings, assessment and care plan.

## 2018-04-26 ENCOUNTER — Inpatient Hospital Stay (HOSPITAL_COMMUNITY): Payer: Medicare Other | Admitting: Anesthesiology

## 2018-04-26 ENCOUNTER — Encounter (HOSPITAL_COMMUNITY)
Admission: EM | Disposition: A | Discharge status: Skilled Nursing Facility | Payer: Self-pay | Source: Home / Self Care | Attending: Family Medicine

## 2018-04-26 ENCOUNTER — Encounter (HOSPITAL_COMMUNITY): Payer: Self-pay | Admitting: *Deleted

## 2018-04-26 DIAGNOSIS — K51211 Ulcerative (chronic) proctitis with rectal bleeding: Secondary | ICD-10-CM

## 2018-04-26 HISTORY — PX: BIOPSY: SHX5522

## 2018-04-26 HISTORY — PX: COLONOSCOPY WITH PROPOFOL: SHX5780

## 2018-04-26 LAB — CBC
HCT: 29.1 % — ABNORMAL LOW (ref 36.0–46.0)
HEMATOCRIT: 33.1 % — AB (ref 36.0–46.0)
HEMOGLOBIN: 9.6 g/dL — AB (ref 12.0–15.0)
Hemoglobin: 10.9 g/dL — ABNORMAL LOW (ref 12.0–15.0)
MCH: 30.8 pg (ref 26.0–34.0)
MCH: 31 pg (ref 26.0–34.0)
MCHC: 32.9 g/dL (ref 30.0–36.0)
MCHC: 33 g/dL (ref 30.0–36.0)
MCV: 93.5 fL (ref 78.0–100.0)
MCV: 93.9 fL (ref 78.0–100.0)
PLATELETS: 543 10*3/uL — AB (ref 150–400)
Platelets: 344 10*3/uL (ref 150–400)
RBC: 3.54 MIL/uL — ABNORMAL LOW (ref 3.87–5.11)
RBC: 3.1 MIL/uL — ABNORMAL LOW (ref 3.87–5.11)
RDW: 14.6 % (ref 11.5–15.5)
RDW: 14.7 % (ref 11.5–15.5)
WBC: 11.7 10*3/uL — ABNORMAL HIGH (ref 4.0–10.5)
WBC: 8.8 10*3/uL (ref 4.0–10.5)

## 2018-04-26 LAB — BASIC METABOLIC PANEL
Anion gap: 7 (ref 5–15)
BUN: 18 mg/dL (ref 8–23)
CALCIUM: 7.6 mg/dL — AB (ref 8.9–10.3)
CO2: 21 mmol/L — AB (ref 22–32)
Chloride: 114 mmol/L — ABNORMAL HIGH (ref 98–111)
Creatinine, Ser: 0.99 mg/dL (ref 0.44–1.00)
GFR calc Af Amer: >60 mL/min (ref >60)
GFR calc non Af Amer: 54 mL/min — ABNORMAL LOW (ref >60)
GLUCOSE: 118 mg/dL — AB (ref 70–99)
POTASSIUM: 3.7 mmol/L (ref 3.5–5.1)
Sodium: 142 mmol/L (ref 135–145)

## 2018-04-26 SURGERY — COLONOSCOPY WITH PROPOFOL
Anesthesia: Monitor Anesthesia Care

## 2018-04-26 MED ORDER — PROPOFOL 500 MG/50ML IV EMUL
INTRAVENOUS | Status: DC | PRN
  Administered 2018-04-26: 100 ug/kg/min via INTRAVENOUS

## 2018-04-26 MED ORDER — PROPOFOL 10 MG/ML IV BOLUS
INTRAVENOUS | Status: DC | PRN
  Administered 2018-04-26: 50 mg via INTRAVENOUS

## 2018-04-26 MED ORDER — LACTATED RINGERS IV SOLN
INTRAVENOUS | Status: DC | PRN
  Administered 2018-04-26: 13:00:00 via INTRAVENOUS

## 2018-04-26 MED ORDER — METHYLPREDNISOLONE SODIUM SUCC 125 MG IJ SOLR
40.0000 mg | Freq: Two times a day (BID) | INTRAMUSCULAR | Status: DC
  Administered 2018-04-26 – 2018-05-03 (×14): 40 mg via INTRAVENOUS
  Filled 2018-04-26 (×14): qty 2

## 2018-04-26 MED ORDER — ONDANSETRON HCL 4 MG/2ML IJ SOLN
INTRAMUSCULAR | Status: DC | PRN
  Administered 2018-04-26: 4 mg via INTRAVENOUS

## 2018-04-26 MED ORDER — SODIUM CHLORIDE 0.9 % IV BOLUS
1000.0000 mL | Freq: Once | INTRAVENOUS | Status: AC
  Administered 2018-04-26: 1000 mL via INTRAVENOUS

## 2018-04-26 MED ORDER — SODIUM CHLORIDE 0.9 % IV SOLN
INTRAVENOUS | Status: DC
  Administered 2018-04-26 – 2018-05-01 (×10): via INTRAVENOUS

## 2018-04-26 SURGICAL SUPPLY — 22 items

## 2018-04-26 NOTE — Progress Notes (Signed)
   04/26/18 0805  What Happened  Was fall witnessed? No  Was patient injured? No  Patient found other (Comment) (Standing up by bed)  Found by No one-pt stated they fell  Stated prior activity bathroom-unassisted  Follow Up  MD notified Family Medicine   Time MD notified [phone]  Family notified Yes-comment  Time family notified [phone] (Went to voicemail)  Adult Fall Risk Assessment  Risk Factor Category (scoring not indicated) Fall has occurred during this admission (document High fall risk)  Patient's Fall Risk High Fall Risk (>13 points)  Adult Fall Risk Interventions  Required Bundle Interventions *See Row Information* High fall risk - low, moderate, and high requirements implemented  Additional Interventions Use of appropriate toileting equipment (bedpan, BSC, etc.);PT/OT need assessed if change in mobility from baseline  Screening for Fall Injury Risk (To be completed on HIGH fall risk patients) - Assessing Need for Low Bed  Risk For Fall Injury- Low Bed Criteria TeleSitter Camera in use  Will Implement Low Bed and Floor Mats Yes  Screening for Fall Injury Risk (To be completed on HIGH fall risk patients who do not meet crieteria for Low Bed) - Assessing Need for Floor Mats Only  Risk For Fall Injury- Criteria for Floor Mats Noncompliant with safety precautions  Will Implement Floor Mats Yes  Pain Assessment  Pain Scale 0-10  Pain Score 5  Pain Type Acute pain  Pain Location Back  Pain Orientation Lower  Pain Descriptors / Indicators Aching;Discomfort  Pain Frequency Intermittent  Pain Onset Sudden  Pain Intervention(s) RN made aware;MD notified (Comment)  PCA/Epidural/Spinal Assessment  Respiratory Pattern Regular;Unlabored;Symmetrical  Neurological  Level of Consciousness Alert  Orientation Level Oriented X4  Cognition Appropriate at baseline  Speech Clear  Pupil Assessment  Yes  R Pupil Size (mm) 3  R Pupil Shape Round  R Pupil Reaction Brisk  L Pupil Size (mm) 3   L Pupil Shape Round  L Pupil Reaction Brisk  Glasgow Coma Scale  Eye Opening 4  Best Verbal Response (NON-intubated) 5  Best Motor Response 6  Glasgow Coma Scale Score 15  Musculoskeletal  Assistive Device BSC  Generalized Weakness Yes  Integumentary  Integumentary (WDL) WDL  Pain Assessment  Date Pain First Started  (Prior to Admission )  Result of Injury No

## 2018-04-26 NOTE — Interval H&P Note (Signed)
History and Physical Interval Note:  04/26/2018 12:49 PM  Janice Ramos  has presented today for surgery, with the diagnosis of hematochezia, abnormal CT  The various methods of treatment have been discussed with the patient and family. After consideration of risks, benefits and other options for treatment, the patient has consented to  Procedure(s): COLONOSCOPY WITH PROPOFOL (N/A) as a surgical intervention .  The patient's history has been reviewed, patient examined, no change in status, stable for surgery.  I have reviewed the patient's chart and labs.  Questions were answered to the patient's satisfaction.     Aeron Lheureux D

## 2018-04-26 NOTE — Anesthesia Procedure Notes (Signed)
Procedure Name: MAC Date/Time: 04/26/2018 1:55 PM Performed by: Lowella Dell, CRNA Pre-anesthesia Checklist: Patient identified, Emergency Drugs available, Suction available, Patient being monitored and Timeout performed Patient Re-evaluated:Patient Re-evaluated prior to induction Oxygen Delivery Method: Simple face mask Induction Type: IV induction Placement Confirmation: positive ETCO2 Dental Injury: Teeth and Oropharynx as per pre-operative assessment

## 2018-04-26 NOTE — Progress Notes (Addendum)
CALL PAGER [phone] for any questions or notifications regarding this patient   FMTS Attending Daily Note: Terisa Starrarina Jimya Ciani, MD  Pager [phone]  Office [phone] I have seen and examined this patient, reviewed their chart. I have discussed this patient with the resident. I agree with the resident's findings, assessment and care plan. Personally examined patient after fall. Bilateral legs with full ROM. No tenderness, redness, or edema. Full ROM of hip flexion/extension, knee flexion/extension. Fall precautions, discussed with nursing staff and residents.    Family Medicine Teaching Service Daily Progress Note Intern Pager: [phone]  Patient name: Janice Ramos Medical record number: [id number] Date of birth: November 17, 1940 Age: 77 y.o. Gender: female  Primary Care Provider: Joana ReamerMullis, Kiersten P, DO  Code Status: prior  Consultations: GI Admission date: 8/15  Pt Overview and Major Events to Date:  8/16- admitted for worsening vomiting and diarrhea  Subjective: Overnight: patient was not able to tolerate the second bottle of bowel prep.  Today: patient is upset that the nurse would not let her have ginger ale to help her get the bowel prep down. I said that once GI comments on if they will precede with the procedure or not we can add back a diet. -received a page [phone] that patient fell- I went to see patient who stated she got up to use the bathroom and had loss of bowel control and slipped on the mess. She states she has bilateral knee and elbow pain and some central rib pain. None of the areas noted to be painful are tender to palpation, swollen, bruised, erythematous, or have crepitus. She denies LOC or hitting her head. I ordered a 1L fluid bolus and will continue to hold her home BP meds since her BP today was 112/64 and she has been NPO and losing a lot of fluids/electrolytes per rectum from the bowel cleanse. Patient is not on blood thinners. No focal deficits or AMS. I do not think imaging  needed at this time.  Objective: Temp:  [97.7 F (36.5 C)-98.4 F (36.9 C)] 97.7 F (36.5 C) (08/19 0811) Pulse Rate:  [79-98] 95 (08/19 0811) Resp:  [18] 18 (08/19 0811) BP: (112-150)/(64-106) 127/106 (08/19 0811) SpO2:  [93 %-99 %] 95 % (08/19 0811)  Physical Exam: General: NAD, pleasant Cardiovascular: RRR Respiratory: CTAB Abdomen: soft, non distended, non tender to palpation, no crepitus or abnormal movement at ribs Extremities: no tenderness to palpation of bilateral knees or elbows. No bruising, redness or swelling.  Assessment and Plan:  Janice Ramos a 77 y.o.femalepresenting with gradual onset nausea and vomiting.   Inflammatory colitis with hematochezia GI consulted and recommended colonoscopy today, however pt unable to tolerate second bowel prep bottle. NPO today. Denies BRBPR. AM CBC with Hgb 10.7 yesterday, stable from 11 day before. Morning CBC was not drawn yet. Cdiff and GI panel negative.  - follow GI recs for colonoscopy - Continue metronidazole (8/16- present)  Until post procedure.  - vitals per unit routine   Acute Anemia, likely due to blood loss from colitis. Hold heparin/enoxaparin. Hgb 10.7 yesterday. Labs not drawn yet today. Monitor for hemodynamic instability.  -follow repeat CBC  Fall, mechanical, while admitted (8/19). Physical exam unremarkable, mechanical in nature. Will monitor, fall precautions. Discussed with nursing. Safety Zone Portal event filed by nursing staff.   Fever,-resolved likely related to colitis, differential includes bacteremia, UTI. No signs of pneumonia. Will discontinue Flagyl after 5 days. Continue to follow cultures- no growth at 1 day.  HSV-2keratitis  No  new eye complaints although reports genital outbreak. Currently on treatment dosing. Did not see ulcers on external exam -Continue home medication valacyclovir  Essential HTN- BP 112/64 this am, holding home HCTZ and lisinopril due to AKI on admission.    - AKI has resolved,still holding meds due to lowered BP this am, NPO status, and 1 episode of fall  - monitor BP  Major depression and bipolar disorder- Stable. No mood instability this am. Multiple psych meds on home med list includingmethylphenidate, mirtazapine, clonazepam, venlafaxine and zolpidem. Clarified home regimen with outpatient pharmacy shortly after admission.  - continue home mirtazapine with atarax PRN for anxiety  - monitor  Urinary incontinence-no home meds -Patient wears depends  AKI, resolved Cr 1.36>0.90 yesterday. -IV fluid bolus since she is NPO, lower BP this am and had fall  -monitor daily bmet    Hyponatremia, resolved Likely hypovolemic hyponatremia as resolved s/p IV hydration.   - monitor on BMP  FEN/GI: NPO until GI recs, 1L bolus this am  PPx: lovenox  Disposition: plan for scope 8/19   Laboratory: Recent Labs  Lab 04/23/18 1458 04/24/18 0350 04/25/18 0906  WBC 9.2 10.2 10.2  HGB 10.2* 11.0* 10.7*  HCT 30.7* 33.7* 33.0*  PLT 399 446* 486*   Recent Labs  Lab 04/22/18 1633 04/23/18 0627 04/24/18 0350 04/25/18 0424  NA 131* 134* 141 145  K 3.5 3.5 2.9* 4.0  CL 96* 105 108 117*  CO2 24 18* 24 20*  BUN 26* 23 16 16   CREATININE 1.36* 1.02* 0.99 0.90  CALCIUM 8.2* 7.3* 8.0* 7.9*  PROT 6.9  --   --   --   BILITOT 0.9  --   --   --   ALKPHOS 58  --   --   --   ALT 22  --   --   --   AST 30  --   --   --   GLUCOSE 118* 95 111* 135*    Imaging/Diagnostic Tests: Possible colonoscopy today  Leeroy Bocknderson, Chelsey L, DO 04/26/2018, 8:59 AM PGY-1, [facility] FPTS Intern pager: [phone], text pages welcome

## 2018-04-26 NOTE — Anesthesia Postprocedure Evaluation (Signed)
Anesthesia Post Note  Patient: Janice Ramos  Procedure(s) Performed: COLONOSCOPY WITH PROPOFOL (N/A ) BIOPSY     Patient location during evaluation: PACU Anesthesia Type: MAC Level of consciousness: awake and alert Pain management: pain level controlled Vital Signs Assessment: post-procedure vital signs reviewed and stable Respiratory status: spontaneous breathing, nonlabored ventilation and respiratory function stable Cardiovascular status: stable and blood pressure returned to baseline Anesthetic complications: no    Last Vitals:  Vitals:   04/26/18 1437 04/26/18 1736  BP: 128/73 108/65  Pulse:  89  Resp: 18   Temp:    SpO2: 98% 95%    Last Pain:  Vitals:   04/26/18 1526  TempSrc:   PainSc: 2                  Audry Pili

## 2018-04-26 NOTE — Transfer of Care (Signed)
Immediate Anesthesia Transfer of Care Note  Patient: Janice Ramos  Procedure(s) Performed: COLONOSCOPY WITH PROPOFOL (N/A ) BIOPSY  Patient Location: PACU and Endoscopy Unit  Anesthesia Type:MAC  Level of Consciousness: awake, drowsy and patient cooperative  Airway & Oxygen Therapy: Patient Spontanous Breathing and Patient connected to nasal cannula oxygen  Post-op Assessment: Report given to RN, Post -op Vital signs reviewed and stable and Patient moving all extremities  Post vital signs: Reviewed and stable  Last Vitals:  Vitals Value Taken Time  BP    Temp    Pulse    Resp    SpO2      Last Pain:  Vitals:   04/26/18 1235  TempSrc: Oral  PainSc: 6       Patients Stated Pain Goal: 6 (24/58/09 9833)  Complications: No apparent anesthesia complications

## 2018-04-26 NOTE — Anesthesia Preprocedure Evaluation (Addendum)
Anesthesia Evaluation  Patient identified by MRN, date of birth, ID band Patient awake    Reviewed: Allergy & Precautions, NPO status , Patient's Chart, lab work & pertinent test results  History of Anesthesia Complications Negative for: history of anesthetic complications  Airway Mallampati: II  TM Distance: >3 FB Neck ROM: Full    Dental  (+) Dental Advisory Given   Pulmonary neg pulmonary ROS,    breath sounds clear to auscultation       Cardiovascular hypertension, Pt. on medications  Rhythm:Regular Rate:Normal     Neuro/Psych  Headaches, PSYCHIATRIC DISORDERS Anxiety Depression Bipolar Disorder    GI/Hepatic Neg liver ROS,  Hematochezia IBS    Endo/Other  diabetes (Denies)  Renal/GU negative Renal ROS  negative genitourinary   Musculoskeletal negative musculoskeletal ROS (+)  Right piriformis syndrome    Abdominal   Peds  Hematology negative hematology ROS (+)   Anesthesia Other Findings HSV  Reproductive/Obstetrics                            Anesthesia Physical Anesthesia Plan  ASA: II  Anesthesia Plan: MAC   Post-op Pain Management:    Induction: Intravenous  PONV Risk Score and Plan: 2 and Propofol infusion and Treatment may vary due to age or medical condition  Airway Management Planned: Nasal Cannula and Natural Airway  Additional Equipment: None  Intra-op Plan:   Post-operative Plan:   Informed Consent: I have reviewed the patients History and Physical, chart, labs and discussed the procedure including the risks, benefits and alternatives for the proposed anesthesia with the patient or authorized representative who has indicated his/her understanding and acceptance.   Dental advisory given  Plan Discussed with: CRNA and Anesthesiologist  Anesthesia Plan Comments:        Anesthesia Quick Evaluation

## 2018-04-26 NOTE — Progress Notes (Signed)
FPTS Interim Progress Note  Patient seen at bedside to clarify code status. Per my conversation with patient this evening, she very clearly indicates that she is DNR/DNI. She is able to provide good reasoning and insight. Code Status updated to reflect DNR/DNI.  Howard PouchLauren Kaya Pottenger, MD PGY-3 [facility]

## 2018-04-26 NOTE — Op Note (Signed)
[facility] Patient Name: Janice Ramos Procedure Date : 04/26/2018 MRN: [id number] Attending MD: Jeani Hawking , MD Date of Birth: [birth year] CSN: [id number] Age: 77 Admit Type: Inpatient Procedure:                Colonoscopy Indications:              Abnormal CT of the GI tract Providers:                Jeani Hawking, MD, Tomma Rakers, RN, Margo Aye, Technician, Levora Angel, CRNA Referring MD:              Medicines:                Propofol per Anesthesia Complications:            No immediate complications. Estimated Blood Loss:     Estimated blood loss was minimal. Procedure:                Pre-Anesthesia Assessment:                           - Prior to the procedure, a History and Physical                            was performed, and patient medications and                            allergies were reviewed. The patient's tolerance of                            previous anesthesia was also reviewed. The risks                            and benefits of the procedure and the sedation                            options and risks were discussed with the patient.                            All questions were answered, and informed consent                            was obtained. Prior Anticoagulants: The patient has                            taken no previous anticoagulant or antiplatelet                            agents. ASA Grade Assessment: III - A patient with                            severe systemic disease. After reviewing the risks  and benefits, the patient was deemed in                            satisfactory condition to undergo the procedure.                           - Sedation was administered by an anesthesia                            professional. Deep sedation was attained.                           After obtaining informed consent, the colonoscope                            was passed under  direct vision. Throughout the                            procedure, the patient's blood pressure, pulse, and                            oxygen saturations were monitored continuously. The                            CF-HQ190L ([id number] Olympus adult colon was                            introduced through the anus and advanced to the the                            ascending colon for evaluation. This was the                            intended extent. The colonoscopy was technically                            difficult and complex due to poor bowel prep. The                            patient tolerated the procedure well. The quality                            of the bowel preparation was adequate. The                            ileocecal valve and the rectum were photographed. Scope In: 12:59:58 PM Scope Out: 1:25:37 PM Total Procedure Duration: 0 hours 25 minutes 39 seconds  Findings:      Inflammation characterized by altered vascularity, congestion (edema),       erythema, friability, granularity, mucus and deep ulcerations was found       in a continuous and circumferential pattern from the rectum to the       descending colon. The transverse colon and the ascending colon were       spared. This  was severe. Biopsies were taken with a cold forceps for       histology.      The prep was poor, but it was adequate to diagnose the inflammation. In       the ascending colon a few small erosions were identified. The       colonoscopy was not advanced into the cecum as there was looping, even       with abdominal pressure. Significant looping was to be avoided with the       severe inflammation in her sigmoid and descending colon. Additionally,       the poor prep continually clogged the colonoscope. Impression:               - Left-sided colitis. Inflammation was found from                            the rectum to the descending colon. This was                            severe.  Biopsied. Recommendation:           - Return patient to hospital ward for ongoing care.                           - Resume regular diet.                           - Continue present medications.                           - Await pathology results.                           - Start Solumedrol 40 mg Q12 hours. Procedure Code(s):        --- Professional ---                           [id number], 52, Colonoscopy, flexible; with biopsy,                            single or multiple Diagnosis Code(s):        --- Professional ---                           K52.9, Noninfective gastroenteritis and colitis,                            unspecified                           R93.3, Abnormal findings on diagnostic imaging of                            other parts of digestive tract CPT copyright 2017 American Medical Association. All rights reserved. The codes documented in this report are preliminary and upon coder review may  be revised to meet current compliance requirements. Jeani HawkingPatrick Minda Faas, MD Jeani HawkingPatrick Waynetta Metheny, MD 04/26/2018 1:35:01 PM This report has been signed electronically. Number  of Addenda: 0

## 2018-04-26 NOTE — Care Management Important Message (Signed)
Important Message  Patient Details  Name: Janice Ramos MRN: [id number] Date of Birth: 27-Apr-1941   Medicare Important Message Given:  Yes    Dorena BodoIris Averil Digman 04/26/2018, 4:15 PM

## 2018-04-27 ENCOUNTER — Encounter (HOSPITAL_COMMUNITY): Payer: Self-pay | Admitting: Gastroenterology

## 2018-04-27 DIAGNOSIS — R339 Retention of urine, unspecified: Secondary | ICD-10-CM

## 2018-04-27 LAB — URINALYSIS, ROUTINE W REFLEX MICROSCOPIC
Bacteria, UA: NONE SEEN
Bilirubin Urine: NEGATIVE
GLUCOSE, UA: 150 mg/dL — AB
Hgb urine dipstick: NEGATIVE
KETONES UR: NEGATIVE mg/dL
Leukocytes, UA: NEGATIVE
NITRITE: NEGATIVE
PH: 5 (ref 5.0–8.0)
Protein, ur: 30 mg/dL — AB
Specific Gravity, Urine: 1.024 (ref 1.005–1.030)

## 2018-04-27 LAB — CBC
HCT: 27.5 % — ABNORMAL LOW (ref 36.0–46.0)
HEMOGLOBIN: 9.3 g/dL — AB (ref 12.0–15.0)
MCH: 31.1 pg (ref 26.0–34.0)
MCHC: 33.8 g/dL (ref 30.0–36.0)
MCV: 92 fL (ref 78.0–100.0)
PLATELETS: 447 10*3/uL — AB (ref 150–400)
RBC: 2.99 MIL/uL — AB (ref 3.87–5.11)
RDW: 14.8 % (ref 11.5–15.5)
WBC: 8.4 10*3/uL (ref 4.0–10.5)

## 2018-04-27 LAB — BASIC METABOLIC PANEL
ANION GAP: 9 (ref 5–15)
BUN: 21 mg/dL (ref 8–23)
CALCIUM: 7.4 mg/dL — AB (ref 8.9–10.3)
CO2: 20 mmol/L — AB (ref 22–32)
CREATININE: 0.71 mg/dL (ref 0.44–1.00)
Chloride: 112 mmol/L — ABNORMAL HIGH (ref 98–111)
Glucose, Bld: 165 mg/dL — ABNORMAL HIGH (ref 70–99)
Potassium: 3.1 mmol/L — ABNORMAL LOW (ref 3.5–5.1)
SODIUM: 141 mmol/L (ref 135–145)

## 2018-04-27 MED ORDER — POTASSIUM CHLORIDE CRYS ER 20 MEQ PO TBCR
40.0000 meq | EXTENDED_RELEASE_TABLET | Freq: Two times a day (BID) | ORAL | Status: AC
  Administered 2018-04-27 (×2): 40 meq via ORAL
  Filled 2018-04-27 (×2): qty 2

## 2018-04-27 NOTE — Progress Notes (Signed)
In and out cath performed and obtained 900 mL output. Patient didn't tolerate the procedure. Yelling and complaining  of pain on her herpes. Education provided on the need for the in and out cath. Patient stated, " I know but I can't help it."

## 2018-04-27 NOTE — Discharge Summary (Addendum)
Family Medicine Teaching [facility] Discharge Summary  Patient name: Janice Ramos C Kenedy Medical record number: [id number] Date of birth: [birth year] Age: 77 y.o. Gender: female Date of Admission: 04/22/2018  Date of Discharge: 05/10/18  Admitting Physician: Westley Chandlerarina M Brown, MD  Primary Care Provider: Joana ReamerMullis, Kiersten P, DO Consultations: GI  Indication for Hospitalization: worsening vomiting and diarrhea  Discharge Diagnoses/Problem List:  Inflammatory colitis with hematochezia 2/2 Ulcerative colitis with duodenal ulcer Esophageal Candidiasis Constipation Anemia HSV-2 Keratitis HTN Major Depression and bipolar disorder Hyperglycemia  Disposition: SNF  Discharge Condition: stable  Discharge Exam:  Physical Exam:  Gen: NAD, alert, non-toxic, well-appearing, sitting comfortably  HEENT: Normocephaic, atraumatic. Clear conjuctiva, no scleral icterus and injection.  CV: Regular rate and rhythm.  Normal S1-S2.   Normal capillary refill bilaterally.  Radial pulses 2+ bilaterally. No bilateral lower extremity edema. Resp: Clear to auscultation bilaterally. No wheezing, rales, abnormal lung sounds.  No increased work of breathing appreciated. Abd: Nontender and nondistended on palpation to all 4 quadrants.  Positive bowel sounds. Psych: Cooperative with exam. Pleasant. Makes eye contact. Appears anxious. Per Dr. Selena BattenKim on day on of discharge.   Brief Hospital Course:  77 year old with history of mood disorder, herpes keratitis, and chronic pain presented with acute colitis. Patient was seen by GI who performed a colonoscopy on 8/19 and found ulcerative colitis. Patient was started on IV steroids and transitioned to long oral steroid taper. Patient improved but then on 8/22 started having bright red blood per rectum again. She then had an EGD performed which found esophageal candidiasis. Patient was started on diflucan for 2 weeks treatment to continue until 9/8. She then had dark tarry stools  that started on 8/28 and patient was started on Linzess and miralax BID.   While admitted due to GI blood loss, patient required 2 U pRBC transfusion and her hemoglobin remained stable prior to discharge. Her last bowel movement was on the morning of discharge which she did not appreciate melena or BRBPR.   Patient developed HSV-2 keratitis while admitted. Ophthalmology was consulted and patient was started on valtrex TID and given artificial tears.   Prior to discharge patient was stable and much improved.   Issues for Follow Up:  1. Follow up with PCP for new use of possible long term steroids use-  1. Osteoporosis monitoring-Dexa scan 2. HepB panel outpatient 3. PJP prophylaxis with Bactrim 3x/week 2. Needs outpatient pelvic ultrasound for thickened endometrium on CT scan. 3. Recheck Hb to ensure stabilization. Hb 10.7 on discharge. 4. Held methylphenidate on discharge. 5. Will need follow up with GI in 2 weeks. 6. Prednisone taper to be continued as follows: s/p 40 mg x 1 week, then 30 mg x 2 weeks, 20 mg x 2 weeks, 15 mg x 2 weeks, 10 mg x 2 weeks, and then 5 mg x 2 weeks 7. Diflucan to be continued until 05/16/18. 8. [facility] after DC to assess any residual AC inflammation.  Significant Procedures:  Colonoscopy on 04/26/18 EGD by GI on 05/06/18  Significant Labs and Imaging:  Recent Labs  Lab 05/08/18 0512 05/09/18 0358 05/10/18 1005  WBC 6.6 6.4 6.1  HGB 11.0* 10.8* 10.7*  HCT 33.8* 33.2* 34.0*  PLT 464* 444* 407*   Recent Labs  Lab 05/04/18 0501 05/05/18 0342 05/06/18 0504  NA 136 134* 134*  K 4.6 4.3 4.6  CL 99 100 98  CO2 30 26 27   GLUCOSE 141* 157* 186*  BUN  20 22 21   CREATININE 0.78 0.77 0.74  CALCIUM 7.8* 7.9* 7.9*  ALKPHOS  --   --  57  AST  --   --  23  ALT  --   --  54*  ALBUMIN  --   --  2.0*   Colonoscopy: Colon, biopsy, Left descending - MODERATELY TO SEVERELY ACTIVE CHRONIC COLITIS, CONSISTENT WITH INFLAMMATORY  BOWEL DISEASE. - THERE IS NO EVIDENCE OF DYSPLASIA OR MALIGNANCY.  Surgical path report from EGD:  Stomach, biopsy, antrum - ANTRAL MUCOSA WITH SLIGHT CHRONIC INFLAMMATION. - WARTHIN-STARRY STAIN NEGATIVE FOR HELICOBACTER PYLORI. - NO INTESTINAL METAPLASIA, DYSPLASIA OR MALIGNANCY.  - Ct Abd/pelvis: Diffuse wall thickening inflammatory change c/w inflammatory colitis. No perforation.  - Repeat CT Abd: improved colitis, worsened inflammation in duodenum. Pleural effusion.   - Colonoscopy: severe L sided inflammatory colitis from rectum to descending colon, biopsied. Biopsy: IBD in UC distribution.  - XR acute abd- confluent retrocardiac opacity at L lung base, atelectasis vs pna, nonobstructive bowel gas pattern, mild to mod gaseous distention.  - CT abd: improvement of colitis, mild inflammatory changes of duodenum, subcutaneous edema, bilateral pleural effusions, thickened endometrium recommend f/u pelvic ultrasound - EGD - esophageal candidiasis, multiple non bleeding ulcers, - H. pylori  Results/Tests Pending at Time of Discharge: none  Discharge Medications:  Allergies as of 05/10/2018      Reactions   Latex Hives      Medication List    STOP taking these medications   ALPRAZolam 1 MG tablet Commonly known as:  XANAX   hydrOXYzine 10 MG tablet Commonly known as:  ATARAX/VISTARIL   methylphenidate 20 MG tablet Commonly known as:  RITALIN   vitamin C 500 MG tablet Commonly known as:  ASCORBIC ACID     TAKE these medications   calcium carbonate 500 MG chewable tablet Commonly known as:  TUMS - dosed in mg elemental calcium Chew 1 tablet (200 mg of elemental calcium total) by mouth 2 (two) times daily.   feeding supplement (ENSURE ENLIVE) Liqd Take 237 mLs by mouth 3 (three) times daily between meals.   fluconazole 100 MG tablet Commonly known as:  DIFLUCAN Take 1 tablet (100 mg total) by mouth daily for 5 days. Start taking on:  05/11/2018   folic acid 1 MG  tablet Commonly known as:  FOLVITE Take 1 tablet (1 mg total) by mouth daily. Start taking on:  05/11/2018   hydrochlorothiazide 25 MG tablet Commonly known as:  HYDRODIURIL Take 1 tablet (25 mg total) by mouth daily.   hydroxypropyl methylcellulose / hypromellose 2.5 % ophthalmic solution Commonly known as:  ISOPTO TEARS / GONIOVISC Place 1 drop into both eyes 4 (four) times daily as needed for dry eyes.   linaclotide 145 MCG Caps capsule Commonly known as:  LINZESS Take 1 capsule (145 mcg total) by mouth daily before breakfast. Start taking on:  05/11/2018   lisinopril 10 MG tablet Commonly known as:  PRINIVIL,ZESTRIL Take 1 tablet (10 mg total) by mouth daily.   mirtazapine 30 MG tablet Commonly known as:  REMERON Take 1 tablet (30 mg total) by mouth at bedtime.   multivitamin with minerals Tabs tablet Take 1 tablet by mouth daily. Start taking on:  05/11/2018   polyethylene glycol packet Commonly known as:  MIRALAX / GLYCOLAX Take 17 g by mouth 2 (two) times daily.   predniSONE 20 MG tablet Commonly known as:  DELTASONE Take 2 tablets (40 mg total) by mouth daily with breakfast. Start taking  on:  05/11/2018   predniSONE 10 MG tablet Commonly known as:  DELTASONE Take 3 tablets (30 mg total) by mouth daily with breakfast for 14 days. Start taking on:  05/12/2018   predniSONE 20 MG tablet Commonly known as:  DELTASONE Take 1 tablet (20 mg total) by mouth daily with breakfast for 14 days. Start taking on:  05/26/2018   predniSONE 5 MG tablet Commonly known as:  DELTASONE Take 3 tablets (15 mg total) by mouth daily with breakfast for 14 days. Start taking on:  06/09/2018   predniSONE 10 MG tablet Commonly known as:  DELTASONE Take 1 tablet (10 mg total) by mouth daily with breakfast for 14 days. Start taking on:  06/23/2018   predniSONE 5 MG tablet Commonly known as:  DELTASONE Take 1 tablet (5 mg total) by mouth daily with breakfast for 14 days. Start taking on:   07/07/2018   thiamine 100 MG tablet Take 1 tablet (100 mg total) by mouth daily. Start taking on:  05/11/2018   valACYclovir 1000 MG tablet Commonly known as:  VALTREX Take 1 tablet (1,000 mg total) by mouth 3 (three) times daily for 4 days. What changed:    medication strength  how much to take  when to take this       Discharge Instructions: Please refer to Patient Instructions section of EMR for full details.  Patient was counseled important signs and symptoms that should prompt return to medical care, changes in medications, dietary instructions, activity restrictions, and follow up appointments.   Follow-Up Appointments: Contact information for after-discharge care    Destination    [facility] .   Service:  Skilled Nursing Contact information: [address] [place] [id number] [phone]              Edis Huish, [place], DO 05/10/2018, 12:52 PM PGY-2, [facility]

## 2018-04-27 NOTE — Progress Notes (Signed)
Patient has not had any urine output today besides a few drops. Patient bladder scanned x 3 this afternoon, most recent volume . MD notified around 1600 and was told to wait 4 hours to see if patient has voided and repeat bladder scan, then to page MD back with new bladder scan results. Patient states she prefers not to have another in and out cath unless absolutely necessary, considering the last was so painful. Will continue to monitor.

## 2018-04-27 NOTE — Progress Notes (Signed)
Family Medicine Teaching Service Daily Progress Note Intern Pager: [phone]  Patient name: Janice Ramos Medical record number: [id number] Date of birth: November 15, 1940 Age: 77 y.o. Gender: female  Primary Care Provider: Joana ReamerMullis, Kiersten P, DO  Code Status: DNR  Consultations: GI Admission date: 8/15  Pt Overview and Major Events to Date:  8/16- admitted for worsening vomiting and diarrhea 8/19- colonoscopy  Subjective: Overnight: patient had little urine output. Bladder scan showed >900cc fluid. Patient was not able to urinate despite feeling full. In and out catheterization revealed ~900cc urine.  Today: Patient stated she has not had any more urine output since cath. She did not have BM overnight. Her abdomen is no longer painful but feels slightly bloated. Overall she feels much better.  Objective: Temp:  [97.5 F (36.4 C)-98.7 F (37.1 C)] 97.8 F (36.6 C) (08/20 1423) Pulse Rate:  [69-89] 72 (08/20 1423) Resp:  [18] 18 (08/20 1423) BP: (91-137)/(53-98) 130/62 (08/20 1423) SpO2:  [95 %-99 %] 99 % (08/20 1423)  Physical Exam: General: NAD, pleasant Cardiovascular: RRR Respiratory: CTAB Abdomen: soft, non distended, non tender to palpation, no distended bladder palpated Extremities: no tenderness, no bruising, redness or swelling.  Assessment and Plan:  Janice Rushingdith C MacCabeis a 77 y.o.femalepresenting with colitis  Inflammatory colitis with hematochezia GI consulted performed colonoscopy 8/19. Showed signs of IBD unclear if UC or Crohn's disease at this time. Biopsies are pending. Denies BRBPR or BM today. Hgb stable. Cdiff and GI panel negative. Flagyl discontinued today - follow GI biopsy results  - vitals per unit routine   Acute Anemia, stable. likely due to blood loss from colitis. Hold heparin/enoxaparin. Monitor for hemodynamic instability.  -repeat CBC  Fall, mechanical, while admitted (8/19). Physical exam unremarkable. Will monitor, fall precautions.  Discussed with nursing. Safety [facility] Portal event filed by nursing staff.  -patient on video monitoring  Fever,-resolved likely related to colitis, differential includes bacteremia, UTI. No signs of pneumonia. Discontinued Flagyl. Continue to follow cultures- no growth at 3 days. -monitor vitals  HSV-2keratitis  No new eye complaints although reports genital outbreak. Currently on treatment dosing. Did not see ulcers on external exam -Continue home medication valacyclovir  Essential HTN- BP 91-137 systolic, 53-98 diastolic today. holding home HCTZ and lisinopril - AKI has resolved,still holding meds due to lowered BP - monitor BP  Major depression and bipolar disorder- Stable. No mood instability this am. Multiple psych meds on home med list includingmethylphenidate, mirtazapine, clonazepam, venlafaxine and zolpidem. Clarified home regimen with outpatient pharmacy shortly after admission.  - continue home mirtazapine - monitor  Urinary retention-patient had bladder scan over night and in and out cath relieved 900cc urine. She did feel full and need to urinate but was not able to initiate a stream on her own. She did not have any leakage - discontinued her dicyclomine and atarax.  -q6 bladder scans -continue to monitor I/O  AKI, resolved Cr 0.71 today -IV fluid NS 3675mL/hr -monitor daily bmet    Hyponatremia, resolved s/p IV hydration.   - monitor on BMP  FEN/GI: full diet, 10675mL/hr NS, can consider d/c if good oral intake and BP stable  PPx: lovenox  Disposition: stable, discharge tomorrow?   Laboratory: Recent Labs  Lab 04/26/18 0810 04/26/18 1018 04/27/18 0548  WBC 11.7* 8.8 8.4  HGB 10.9* 9.6* 9.3*  HCT 33.1* 29.1* 27.5*  PLT 543* 344 447*   Recent Labs  Lab 04/22/18 1633  04/25/18 0424 04/26/18 0810 04/27/18 0548  NA 131*   < >  145 142 141  K 3.5   < > 4.0 3.7 3.1*  CL 96*   < > 117* 114* 112*  CO2 24   < > 20* 21* 20*  BUN 26*   < > 16 18 21    CREATININE 1.36*   < > 0.90 0.99 0.71  CALCIUM 8.2*   < > 7.9* 7.6* 7.4*  PROT 6.9  --   --   --   --   BILITOT 0.9  --   --   --   --   ALKPHOS 58  --   --   --   --   ALT 22  --   --   --   --   AST 30  --   --   --   --   GLUCOSE 118*   < > 135* 118* 165*   < > = values in this interval not displayed.    Imaging/Diagnostic Tests: Colonoscopy yesterday showed inflammatory colitis with biopsy pending  Leeroy Bocknderson, Chelsey L, DO 04/27/2018, 2:49 PM PGY-1, [facility] FPTS Intern pager: [phone], text pages welcome

## 2018-04-27 NOTE — Progress Notes (Signed)
Unable to placed Foley Catheter. This nurse made two attempts but unsuccessful . Patient unable to tolerate due to irritation, outbreak of lesions and burning sensation.  notified the MD.  Will continue to monitor the patient.

## 2018-04-27 NOTE — Progress Notes (Signed)
690 mL of urine was found during the bladder scan.  .  Notified the MD .  Will continue to monitor the patient.

## 2018-04-27 NOTE — Progress Notes (Signed)
Patient is having urinary retention and has not been able to void after 2 pm according to her statement. Bladder scan read > 999 mL. Patient was able to urinate 300 mL after the bladder. Meccariello Bailey DO notified with a new order for in and out cath  Once. Will do and continue to monitor.

## 2018-04-27 NOTE — Progress Notes (Signed)
Subjective: No complaints of abdominal pain.  She is feeling better.  Objective: Vital signs in last 24 hours: Temp:  [97.5 F (36.4 C)-98.7 F (37.1 C)] 97.5 F (36.4 C) (08/20 0534) Pulse Rate:  [56-95] 69 (08/20 0534) Resp:  [16-31] 18 (08/20 0534) BP: (91-176)/(53-106) 124/98 (08/20 0534) SpO2:  [93 %-99 %] 97 % (08/20 0534) Last BM Date: 04/26/18  Intake/Output from previous day: 08/19 0701 - 08/20 0700 In: 3282.7 [P.O.:250; I.V.:1597.6; IV Piggyback:1435.1] Out: 1200 [Urine:1200] Intake/Output this shift: No intake/output data recorded.  General appearance: alert and no distress GI: soft, non-tender; bowel sounds normal; no masses,  no organomegaly  Lab Results: Recent Labs    04/26/18 0810 04/26/18 1018 04/27/18 0548  WBC 11.7* 8.8 8.4  HGB 10.9* 9.6* 9.3*  HCT 33.1* 29.1* 27.5*  PLT 543* 344 447*   BMET Recent Labs    04/25/18 0424 04/26/18 0810 04/27/18 0548  NA 145 142 141  K 4.0 3.7 3.1*  CL 117* 114* 112*  CO2 20* 21* 20*  GLUCOSE 135* 118* 165*  BUN 16 18 21   CREATININE 0.90 0.99 0.71  CALCIUM 7.9* 7.6* 7.4*   LFT No results for input(s): PROT, ALBUMIN, AST, ALT, ALKPHOS, BILITOT, BILIDIR, IBILI in the last 72 hours. PT/INR No results for input(s): LABPROT, INR in the last 72 hours. Hepatitis Panel No results for input(s): HEPBSAG, HCVAB, HEPAIGM, HEPBIGM in the last 72 hours. C-Diff No results for input(s): CDIFFTOX in the last 72 hours. Fecal Lactopherrin No results for input(s): FECLLACTOFRN in the last 72 hours.  Studies/Results: No results found.  Medications:  Scheduled: . dicyclomine  10 mg Oral TID AC & HS  . feeding supplement  1 Container Oral TID BM  . feeding supplement (PRO-STAT SUGAR FREE 64)  30 mL Oral Daily  . folic acid  1 mg Oral Daily  . methylPREDNISolone (SOLU-MEDROL) injection  40 mg Intravenous Q12H  . mirtazapine  30 mg Oral QHS  . multivitamin with minerals  1 tablet Oral Daily  . thiamine  100 mg Oral  Daily  . valACYclovir  500 mg Oral BID   Continuous: . sodium chloride    . sodium chloride 75 mL/hr at 04/27/18 0617  . metronidazole 500 mg (04/27/18 [id number])    Assessment/Plan: 1) Severe left sided colitis.   The biopsies are pending, but the gross appearance is consistent with IBD.  It is unclear if she has UC versus Crohn's disease.    Plan: 1) Continue with Solumedrol. 2) Add 5-ASA, if the patient has UC.  5-ASA products have not been shown to be of benefit in Crohn's disease.  LOS: 4 days   Janice Ramos D 04/27/2018, 8:09 AM

## 2018-04-28 LAB — BASIC METABOLIC PANEL
ANION GAP: 6 (ref 5–15)
BUN: 28 mg/dL — ABNORMAL HIGH (ref 8–23)
CALCIUM: 7.6 mg/dL — AB (ref 8.9–10.3)
CO2: 20 mmol/L — AB (ref 22–32)
CREATININE: 1.02 mg/dL — AB (ref 0.44–1.00)
Chloride: 116 mmol/L — ABNORMAL HIGH (ref 98–111)
GFR calc Af Amer: >60 mL/min (ref >60)
GFR calc non Af Amer: 52 mL/min — ABNORMAL LOW (ref >60)
Glucose, Bld: 194 mg/dL — ABNORMAL HIGH (ref 70–99)
Potassium: 4.2 mmol/L (ref 3.5–5.1)
Sodium: 142 mmol/L (ref 135–145)

## 2018-04-28 LAB — CBC
HCT: 28.8 % — ABNORMAL LOW (ref 36.0–46.0)
HEMOGLOBIN: 9.8 g/dL — AB (ref 12.0–15.0)
MCH: 31.4 pg (ref 26.0–34.0)
MCHC: 34 g/dL (ref 30.0–36.0)
MCV: 92.3 fL (ref 78.0–100.0)
PLATELETS: 533 10*3/uL — AB (ref 150–400)
RBC: 3.12 MIL/uL — AB (ref 3.87–5.11)
RDW: 14.9 % (ref 11.5–15.5)
WBC: 15.8 10*3/uL — AB (ref 4.0–10.5)

## 2018-04-28 LAB — GLUCOSE, CAPILLARY
GLUCOSE-CAPILLARY: 186 mg/dL — AB (ref 70–99)
GLUCOSE-CAPILLARY: 253 mg/dL — AB (ref 70–99)
Glucose-Capillary: 162 mg/dL — ABNORMAL HIGH (ref 70–99)

## 2018-04-28 LAB — HEMOGLOBIN A1C
HEMOGLOBIN A1C: 6.5 % — AB (ref 4.8–5.6)
MEAN PLASMA GLUCOSE: 139.85 mg/dL

## 2018-04-28 MED ORDER — MORPHINE SULFATE (PF) 2 MG/ML IV SOLN
1.0000 mg | Freq: Once | INTRAVENOUS | Status: AC
  Administered 2018-04-28: 1 mg via INTRAVENOUS
  Filled 2018-04-28: qty 1

## 2018-04-28 MED ORDER — LIDOCAINE HCL URETHRAL/MUCOSAL 2 % EX GEL
1.0000 "application " | Freq: Once | CUTANEOUS | Status: DC
  Filled 2018-04-28: qty 5
  Filled 2018-04-28: qty 20

## 2018-04-28 MED ORDER — HEPARIN SODIUM (PORCINE) 5000 UNIT/ML IJ SOLN
5000.0000 [IU] | Freq: Three times a day (TID) | INTRAMUSCULAR | Status: DC
  Administered 2018-04-28 – 2018-04-29 (×3): 5000 [IU] via SUBCUTANEOUS
  Filled 2018-04-28 (×3): qty 1

## 2018-04-28 MED ORDER — MESALAMINE 1.2 G PO TBEC
4.8000 g | DELAYED_RELEASE_TABLET | Freq: Every day | ORAL | Status: DC
  Administered 2018-04-28: 4.8 g via ORAL
  Filled 2018-04-28 (×3): qty 4

## 2018-04-28 MED ORDER — HYPROMELLOSE (GONIOSCOPIC) 2.5 % OP SOLN
1.0000 [drp] | Freq: Four times a day (QID) | OPHTHALMIC | Status: DC | PRN
  Administered 2018-04-28: 1 [drp] via OPHTHALMIC
  Filled 2018-04-28: qty 15

## 2018-04-28 MED ORDER — INSULIN ASPART 100 UNIT/ML ~~LOC~~ SOLN
0.0000 [IU] | Freq: Three times a day (TID) | SUBCUTANEOUS | Status: DC
  Administered 2018-04-28 (×2): 2 [IU] via SUBCUTANEOUS
  Administered 2018-04-29: 1 [IU] via SUBCUTANEOUS
  Administered 2018-04-29: 2 [IU] via SUBCUTANEOUS
  Administered 2018-04-29: 1 [IU] via SUBCUTANEOUS
  Administered 2018-04-30: 2 [IU] via SUBCUTANEOUS
  Administered 2018-04-30: 3 [IU] via SUBCUTANEOUS
  Administered 2018-04-30: 1 [IU] via SUBCUTANEOUS
  Administered 2018-05-01 (×2): 3 [IU] via SUBCUTANEOUS
  Administered 2018-05-01 – 2018-05-02 (×4): 2 [IU] via SUBCUTANEOUS
  Administered 2018-05-03: 3 [IU] via SUBCUTANEOUS
  Administered 2018-05-03: 7 [IU] via SUBCUTANEOUS
  Administered 2018-05-03: 2 [IU] via SUBCUTANEOUS
  Administered 2018-05-04: 5 [IU] via SUBCUTANEOUS
  Administered 2018-05-04 (×2): 1 [IU] via SUBCUTANEOUS
  Administered 2018-05-05: 3 [IU] via SUBCUTANEOUS
  Administered 2018-05-06: 5 [IU] via SUBCUTANEOUS
  Administered 2018-05-07: 2 [IU] via SUBCUTANEOUS
  Administered 2018-05-07: 3 [IU] via SUBCUTANEOUS
  Administered 2018-05-08: 5 [IU] via SUBCUTANEOUS
  Administered 2018-05-08: 1 [IU] via SUBCUTANEOUS
  Administered 2018-05-09: 3 [IU] via SUBCUTANEOUS
  Administered 2018-05-09: 5 [IU] via SUBCUTANEOUS
  Administered 2018-05-10: 2 [IU] via SUBCUTANEOUS

## 2018-04-28 MED ORDER — KETOROLAC TROMETHAMINE 15 MG/ML IJ SOLN
15.0000 mg | Freq: Once | INTRAMUSCULAR | Status: AC
  Administered 2018-04-28: 15 mg via INTRAVENOUS
  Filled 2018-04-28: qty 1

## 2018-04-28 NOTE — Progress Notes (Signed)
Subjective: She reports having problems with catherization.  Diarrhea persists, but it has decreased in frequency.  No overt bleeding.  Objective: Vital signs in last 24 hours: Temp:  [97.7 F (36.5 C)-97.8 F (36.6 C)] 97.7 F (36.5 C) (08/21 0535) Pulse Rate:  [72-81] 74 (08/21 0535) Resp:  [18] 18 (08/21 0535) BP: (130-152)/(62-89) 152/78 (08/21 0535) SpO2:  [97 %-99 %] 97 % (08/21 0535) Last BM Date: 04/27/18  Intake/Output from previous day: 08/20 0701 - 08/21 0700 In: 482 [P.O.:482] Out: 1000 [Urine:1000] Intake/Output this shift: No intake/output data recorded.  General appearance: alert and no distress GI: soft, non-tender; bowel sounds normal; no masses,  no organomegaly  Lab Results: Recent Labs    04/26/18 0810 04/26/18 1018 04/27/18 0548  WBC 11.7* 8.8 8.4  HGB 10.9* 9.6* 9.3*  HCT 33.1* 29.1* 27.5*  PLT 543* 344 447*   BMET Recent Labs    04/26/18 0810 04/27/18 0548  NA 142 141  K 3.7 3.1*  CL 114* 112*  CO2 21* 20*  GLUCOSE 118* 165*  BUN 18 21  CREATININE 0.99 0.71  CALCIUM 7.6* 7.4*   LFT No results for input(s): PROT, ALBUMIN, AST, ALT, ALKPHOS, BILITOT, BILIDIR, IBILI in the last 72 hours. PT/INR No results for input(s): LABPROT, INR in the last 72 hours. Hepatitis Panel No results for input(s): HEPBSAG, HCVAB, HEPAIGM, HEPBIGM in the last 72 hours. C-Diff No results for input(s): CDIFFTOX in the last 72 hours. Fecal Lactopherrin No results for input(s): FECLLACTOFRN in the last 72 hours.  Studies/Results: No results found.  Medications:  Scheduled: . feeding supplement  1 Container Oral TID BM  . feeding supplement (PRO-STAT SUGAR FREE 64)  30 mL Oral Daily  . folic acid  1 mg Oral Daily  . lidocaine  1 application Urethral Once  . mesalamine  4.8 g Oral Q breakfast  . methylPREDNISolone (SOLU-MEDROL) injection  40 mg Intravenous Q12H  . mirtazapine  30 mg Oral QHS  . multivitamin with minerals  1 tablet Oral Daily  .  thiamine  100 mg Oral Daily  . valACYclovir  500 mg Oral BID   Continuous: . sodium chloride    . sodium chloride 75 mL/hr at 04/27/18 1851    Assessment/Plan: 1) Ulcerative colitis. 2) Diarrhea.   The biopsies show that the findings are consistent with IBD.  The distribution favors UC.  There was inflammation in the rectum, but it did not appear to be originating from the distal rectum.  She still has diarrhea and she estimates 5 bowel movements.  Plan: 1) Continue with Solumedrol. 2) Start Lialda 4.8 grams per day. 3) Once stools start to have some form she can be switched to oral steroids.  LOS: 5 days   Dmya Long D 04/28/2018, 7:26 AM

## 2018-04-28 NOTE — Progress Notes (Signed)
Patient has acute urinary retention and foley attempts have been unsuccessful due to pain from lesions and burning sensation.  Spoke with patient and explained to her that only treatment at this time is a foley placement and that if urology was consulted, they would most likely attempt placement as well.  Patient voiced understanding and stated that she would be willing to try another foley placement following some pain medication.  Bladder scan performed at bedside 737cc.  Urology was called and updated on situation.  Recommended giving pain medication to assist with placement of foley.  Dr. Marlou PorchHerrick stated that most important thing at this time is to get foley placed and there is little concern for worsening urinary retention with pain medication at this time.  Can also use lidocaine jelly to pain relief while inserting the foley.  Recommended that we again attempt foley placement and if unsuccessful, to call him and he will come in to help.  He stated that when foley is placed, should leave in place for 3 days.  Will give Morphine 1mg  IV once and Toradol 15mg  IV once. Will report to bedside to attempt foley placement with lidocaine jelly.  Luis AbedBailey Meccariello, D.O.  PGY-1 Family Medicine  04/28/2018 2:58 AM

## 2018-04-28 NOTE — Progress Notes (Signed)
Charge nurse Boneta LucksJenny T made an attempt to in and out cath the patient.  Unsuccessful.  Pt was screaming during the procedure.  Patient stated the pain is unbearable due to herpes simplex. Patient labia swollen and irritated with small areas that is bleeding.  Notified the MD. Will continue to monitor the patient.

## 2018-04-28 NOTE — Consult Note (Signed)
Discussed foley catheter placement with house staff.  Recommended she be given adequate pain medications for foley placement including 4mg  Morphine and 15mg  Toradol prior to foley catheter attempt.  Would recommend adequate exposure of urethral meatus so that foley catheter attempt is easy and to minimize risk of failed attempt.  Would keep catheter in for 3 days to allow bladder to heal from stretch injury and GU tract to cool off from HSV prior to removing catheter.    Reconsult if failed attempt at catheter with the above.

## 2018-04-28 NOTE — Evaluation (Addendum)
Physical Therapy Evaluation Patient Details Name: Janice Ramos C Coccia MRN: [id number] DOB: [birth year] Today's Date: 04/28/2018   History of Present Illness  77 y.o. female presenting with gradual onset nausea and vomiting in setting of Inflammatory colitis with hematochezia  Clinical Impression  Orders received for PT evaluation. Patient demonstrates deficits in functional mobility as indicated below. Will benefit from continued skilled PT to address deficits and maximize function. Will see as indicated and progress as tolerated.  Given generalized deconditioning, limited activity tolerance, and overall inability to care for herself in this condition, feel patient will need SNF upon acute discharge. Patient in agreement.    Follow Up Recommendations Supervision/Assistance - 24 hour    Equipment Recommendations  (TBD)    Recommendations for Other Services       Precautions / Restrictions Precautions Precautions: Fall      Mobility  Bed Mobility Overal bed mobility: Needs Assistance Bed Mobility: Rolling;Supine to Sit;Sit to Supine Rolling: Min assist   Supine to sit: Min assist Sit to supine: Mod assist   General bed mobility comments: min assist to come to EOB, mutli modal cues for positioning increased time and effort to perform, increased assist to return to supine due to fatigue.  Transfers Overall transfer level: Needs assistance Equipment used: Rolling walker (2 wheeled) Transfers: Sit to/from Stand Sit to Stand: Min assist         General transfer comment: min assist to power up to standing, noted LE weakness  Ambulation/Gait Ambulation/Gait assistance: Min assist Gait Distance (Feet): 16 Feet Assistive device: Rolling walker (2 wheeled) Gait Pattern/deviations: Step-through pattern;Decreased stride length;Shuffle;Trunk flexed Gait velocity: decreased Gait velocity interpretation: <1.31 ft/sec, indicative of household ambulator General Gait Details: patient  very weak, easily fatigues with BLE cramping with mobility. Heavy reliance on RW at this time and hands on assist throughout.   Stairs            Wheelchair Mobility    Modified Rankin (Stroke Patients Only)       Balance Overall balance assessment: History of Falls                                           Pertinent Vitals/Pain Pain Assessment: Faces Faces Pain Scale: Hurts even more Pain Location: stomach pain and cramping in legs Pain Descriptors / Indicators: Aching;Discomfort;Grimacing;Guarding Pain Intervention(s): Monitored during session    Home Living Family/patient expects to be discharged to:: Private residence Living Arrangements: Alone Available Help at Discharge: Family;Available PRN/intermittently Type of Home: Apartment(but has been staying in a hotel due to apt complex reno) Home Access: Stairs to enter Entrance Stairs-Rails: Right;Left Entrance Stairs-Number of Steps: steep flight Home Layout: One level        Prior Function Level of Independence: Independent               Hand Dominance   Dominant Hand: Right    Extremity/Trunk Assessment   Upper Extremity Assessment Upper Extremity Assessment: Generalized weakness    Lower Extremity Assessment Lower Extremity Assessment: Generalized weakness       Communication      Cognition Arousal/Alertness: Awake/alert Behavior During Therapy: WFL for tasks assessed/performed Overall Cognitive Status: No family/caregiver present to determine baseline cognitive functioning  General Comments      Exercises     Assessment/Plan    PT Assessment Patient needs continued PT services  PT Problem List Decreased strength;Decreased activity tolerance;Decreased balance;Decreased mobility;Pain       PT Treatment Interventions DME instruction;Gait training;Functional mobility training;Therapeutic activities;Stair  training;Balance training;Therapeutic exercise;Patient/family education    PT Goals (Current goals can be found in the Care Plan section)  Acute Rehab PT Goals Patient Stated Goal: to get better PT Goal Formulation: With patient Time For Goal Achievement: 05/12/18 Potential to Achieve Goals: Good    Frequency Min 2X/week   Barriers to discharge Decreased caregiver support;Inaccessible home environment      Co-evaluation               AM-PAC PT "6 Clicks" Daily Activity  Outcome Measure Difficulty turning over in bed (including adjusting bedclothes, sheets and blankets)?: A Little Difficulty moving from lying on back to sitting on the side of the bed? : Unable Difficulty sitting down on and standing up from a chair with arms (e.g., wheelchair, bedside commode, etc,.)?: Unable Help needed moving to and from a bed to chair (including a wheelchair)?: A Little Help needed walking in hospital room?: A Little Help needed climbing 3-5 steps with a railing? : A Lot 6 Click Score: 13    End of Session Equipment Utilized During Treatment: Gait belt Activity Tolerance: Patient limited by fatigue Patient left: in bed;with call bell/phone within reach;with nursing/sitter in room(could not set bed alarm due) Nurse Communication: Mobility status PT Visit Diagnosis: Unsteadiness on feet (R26.81);Difficulty in walking, not elsewhere classified (R26.2);Muscle weakness (generalized) (M62.81)    Time: [id number] PT Time Calculation (min) (ACUTE ONLY): 18 min   Charges:   PT Evaluation $PT Eval Moderate Complexity: 1 Mod          Charlotte Crumbevon Stiven Kaspar, PT DPT  Board Certified Neurologic Specialist [phone]   Fabio AsaDevon J Scotti Motter 04/28/2018, 4:46 PM

## 2018-04-28 NOTE — Progress Notes (Addendum)
CALL PAGER [phone] for any questions or notifications regarding this patient   FMTS Attending Daily Note: Terisa Starrarina Brown, MD  Pager [phone]  Office [phone] I have seen and examined this patient, reviewed their chart. I have discussed this patient with the resident. I agree with the resident's findings, assessment and care plan.See my edits to the below note for details.   Family Medicine Teaching Service Daily Progress Note Intern Pager: [phone]  Patient name: Janice Ramos Medical record number: [id number] Date of birth: 08/11/1941 Age: 77 y.o. Gender: female  Primary Care Provider: Joana ReamerMullis, Kiersten P, DO  Code Status: DNR  Consultations: GI Admission date: 8/15  Pt Overview and Major Events to Date:  8/16- admitted for worsening vomiting and diarrhea 8/19- colonoscopy 8/21- diagnosed ulcerative Ramos  Subjective: Overnight: patient had little urine output. Bladder scan showed >700cc fluid. Patient was not able to urinate despite feeling full. Catheter placed overnight and will stay in 3 days per urology. Very difficult foley placement d/t pain and swelling 2/2 HSV Today: Patient is doing well. Complains of eye irritation x3 days.  Objective: Temp:  [97.7 F (36.5 C)-97.8 F (36.6 C)] 97.7 F (36.5 C) (08/21 0535) Pulse Rate:  [72-81] 74 (08/21 0535) Resp:  [18] 18 (08/21 0535) BP: (130-152)/(62-89) 152/78 (08/21 0535) SpO2:  [97 %-99 %] 97 % (08/21 0535)  Physical Exam: General: NAD, pleasant Cardiovascular: RRR Respiratory: CTAB Abdomen: soft, non distended, non tender to palpation, no distended bladder palpated Extremities: no tenderness, no bruising, redness or swelling. Eyes: PERRLA, no gross abnormalities or lesions seen on cornea. No FB seen. Eyes are watery on exam.  Assessment and Plan:  Janice Ramos  Inflammatory Ramos with hematochezia GI consulted performed colonoscopy 8/19. Showed signs of IBD  Biopsies showed Ulcerative Ramos. Denies BRBPR or BM today. Hgb stable. Cdiff and GI panel negative. - vitals per unit routine - GI referral outpatient - Appreciate GI recommendations. Continue Solumedrol until more formed stools. Mesalamine per GI.   Acute Anemia, stable. likely due to blood loss from Ramos. Hold heparin/enoxaparin. Monitor for hemodynamic instability.  -repeat CBC, as has been stable, start heparin prophylaxis    Fall, mechanical, while admitted (8/19). Physical exam unremarkable. Will monitor, fall precautions. Discussed with nursing. Safety Zone Portal event filed by nursing staff.  -patient on video monitoring  Fever,-resolved likely related to Ramos, differential includes bacteremia, UTI. No signs of pneumonia. Discontinued Flagyl. Continue to follow cultures- no growth at 3 days. -monitor vitals  HSV-2keratitis  No new eye complaints although reports genital outbreak. Currently on treatment dosing. Did not see ulcers on external exam -Continue home medication valacyclovir -ordered lubricating eye drops  Essential HTN- BP 91-137 systolic, 53-98 diastolic today. holding home HCTZ and lisinopril - AKI has resolved,still holding meds due to lowered BP - monitor BP  Major depression and bipolar disorder- Stable. No mood instability this am. Multiple psych meds on home med list includingmethylphenidate, mirtazapine, clonazepam, venlafaxine and zolpidem. Clarified home regimen with outpatient pharmacy shortly after admission.  - continue home mirtazapine - monitor  Urinary retention-patient had bladder scan over night. was not able to initiate a stream on her own. She did not have any leakage. Urology consulted recommended foley catheter x3 days Consider discontinuing Mirtazapine.   AKI, resolved Cr 1.02 today -IV fluid NS 275mL/hr -monitor daily bmet    Hyponatremia, resolved s/p IV hydration.   - monitor on BMP  Hyperglycemia patient has no  history of diabetes.  Her blood sugar levels have been elevated to 194 today. Added a sSSI today plus a 5 unit novolog. Patient's glucose is probably elevated due to steroid use. A1c today is 6.5 -continue to follow CBGs on admission -needs to follow up with PCP outpatient   FEN/GI: full diet, 2875mL/hr NS, can consider d/c if good oral intake and BP stable  PPx: heparin   Disposition: stable, discharge tomorrow when on oral steroid dosing and stools more formed. Consider PT evaluation.   Laboratory: Recent Labs  Lab 04/26/18 0810 04/26/18 1018 04/27/18 0548  WBC 11.7* 8.8 8.4  HGB 10.9* 9.6* 9.3*  HCT 33.1* 29.1* 27.5*  PLT 543* 344 447*   Recent Labs  Lab 04/22/18 1633  04/25/18 0424 04/26/18 0810 04/27/18 0548  NA 131*   < > 145 142 141  K 3.5   < > 4.0 3.7 3.1*  CL 96*   < > 117* 114* 112*  CO2 24   < > 20* 21* 20*  BUN 26*   < > 16 18 21   CREATININE 1.36*   < > 0.90 0.99 0.71  CALCIUM 8.2*   < > 7.9* 7.6* 7.4*  PROT 6.9  --   --   --   --   BILITOT 0.9  --   --   --   --   ALKPHOS 58  --   --   --   --   ALT 22  --   --   --   --   AST 30  --   --   --   --   GLUCOSE 118*   < > 135* 118* 165*   < > = values in this interval not displayed.    Imaging/Diagnostic Tests: Colonoscopy biopsy showed ulcerative Ramos  Leeroy Bocknderson, Chelsey L, DO 04/28/2018, 7:43 AM PGY-1, [facility] FPTS Intern pager: [phone], text pages welcome

## 2018-04-28 NOTE — Progress Notes (Signed)
Foley successfully placed, urine flashblack noted.  Will leave in for 3 days per urology recommendations.    Luis AbedBailey Afnan Cadiente, D.O.  PGY-1 Family Medicine  04/28/2018 4:06 AM

## 2018-04-29 DIAGNOSIS — K529 Noninfective gastroenteritis and colitis, unspecified: Secondary | ICD-10-CM

## 2018-04-29 DIAGNOSIS — K921 Melena: Secondary | ICD-10-CM

## 2018-04-29 DIAGNOSIS — B0052 Herpesviral keratitis: Secondary | ICD-10-CM

## 2018-04-29 LAB — CULTURE, BLOOD (ROUTINE X 2)
Culture: NO GROWTH
Culture: NO GROWTH

## 2018-04-29 LAB — CBC
HCT: 26.1 % — ABNORMAL LOW (ref 36.0–46.0)
Hemoglobin: 8.7 g/dL — ABNORMAL LOW (ref 12.0–15.0)
MCH: 31 pg (ref 26.0–34.0)
MCHC: 33.3 g/dL (ref 30.0–36.0)
MCV: 92.9 fL (ref 78.0–100.0)
Platelets: 488 10*3/uL — ABNORMAL HIGH (ref 150–400)
RBC: 2.81 MIL/uL — ABNORMAL LOW (ref 3.87–5.11)
RDW: 15.1 % (ref 11.5–15.5)
WBC: 11.6 10*3/uL — ABNORMAL HIGH (ref 4.0–10.5)

## 2018-04-29 LAB — BASIC METABOLIC PANEL WITH GFR
Anion gap: 3 — ABNORMAL LOW (ref 5–15)
BUN: 28 mg/dL — ABNORMAL HIGH (ref 8–23)
CO2: 20 mmol/L — ABNORMAL LOW (ref 22–32)
Calcium: 7.2 mg/dL — ABNORMAL LOW (ref 8.9–10.3)
Chloride: 117 mmol/L — ABNORMAL HIGH (ref 98–111)
Creatinine, Ser: 0.91 mg/dL (ref 0.44–1.00)
GFR calc Af Amer: >60 mL/min
GFR calc non Af Amer: 60 mL/min — ABNORMAL LOW
Glucose, Bld: 188 mg/dL — ABNORMAL HIGH (ref 70–99)
Potassium: 4.3 mmol/L (ref 3.5–5.1)
Sodium: 140 mmol/L (ref 135–145)

## 2018-04-29 LAB — GLUCOSE, CAPILLARY
GLUCOSE-CAPILLARY: 148 mg/dL — AB (ref 70–99)
GLUCOSE-CAPILLARY: 139 mg/dL — AB (ref 70–99)
GLUCOSE-CAPILLARY: 168 mg/dL — AB (ref 70–99)
Glucose-Capillary: 173 mg/dL — ABNORMAL HIGH (ref 70–99)

## 2018-04-29 MED ORDER — CALCIUM CARBONATE ANTACID 500 MG PO CHEW
1.0000 | CHEWABLE_TABLET | Freq: Two times a day (BID) | ORAL | Status: DC
  Administered 2018-04-29 – 2018-05-08 (×10): 200 mg via ORAL
  Filled 2018-04-29 (×19): qty 1

## 2018-04-29 MED ORDER — ENSURE ENLIVE PO LIQD
237.0000 mL | Freq: Three times a day (TID) | ORAL | Status: DC
  Administered 2018-04-29 – 2018-05-09 (×29): 237 mL via ORAL

## 2018-04-29 MED ORDER — ONDANSETRON HCL 4 MG/2ML IJ SOLN
4.0000 mg | Freq: Four times a day (QID) | INTRAMUSCULAR | Status: DC | PRN
  Administered 2018-04-29 – 2018-05-08 (×2): 4 mg via INTRAVENOUS
  Filled 2018-04-29 (×2): qty 2

## 2018-04-29 MED ORDER — HYDRALAZINE HCL 20 MG/ML IJ SOLN: 5.0000 mg | Freq: Four times a day (QID) | INTRAMUSCULAR | Status: DC | PRN

## 2018-04-29 NOTE — Progress Notes (Signed)
Inpatient Diabetes Program Recommendations  AACE/ADA: New Consensus Statement on Inpatient Glycemic Control (2019)  Target Ranges:  Prepandial:   less than 140 mg/dL      Peak postprandial:   less than 180 mg/dL (1-2 hours)      Critically ill patients:  140 - 180 mg/dL   Results for Janice Ramos, Janice Ramos "EDY" (MRN [id number]) as of 04/29/2018 10:30  Ref. Range 04/28/2018 12:43 04/28/2018 16:28 04/28/2018 21:11 04/29/2018 08:14  Glucose-Capillary Latest Ref Range: 70 - 99 mg/dL 409162 (H) 811186 (H) 914253 (H) 173 (H)   Review of Glycemic Control  Diabetes history: Pre-DM Outpatient Diabetes medications: None Current orders for Inpatient glycemic control: Novlog 0-9 units TID with meals  Inpatient Diabetes Program Recommendations: Correction (SSI): Please consider ordering Novolog 0-5 units QHS for bedtime correction. Diet: Please consider changing diet to Carb Modified.  Thanks, Orlando PennerMarie Liv Rallis, RN, MSN, CDE Diabetes Coordinator Inpatient Diabetes Program [phone] (Team Pager from 8am to 5pm)

## 2018-04-29 NOTE — Progress Notes (Signed)
Patient BP165/83. No PRN orders.  notified MD. Will continue to monitor the patient

## 2018-04-29 NOTE — Progress Notes (Signed)
Family Medicine Teaching Service Daily Progress Note Intern Pager: [phone]  Patient name: Janice Ramos Medical record number: [id number] Date of birth: [birth year] Age: 77 y.o. Gender: female  Primary Care Provider: Joana Reamer, DO  Code Status: DNR  Consultations: GI Admission date: 8/15  Pt Overview and Major Events to Date:  8/16- admitted for worsening vomiting and diarrhea 8/19- colonoscopy 8/21- diagnosed ulcerative colitis 8/22- restart of BRBPR  Subjective: Overnight: patient was still on foley catheter with good urine output. Patient had reoccurrence of bleeding per rectum, nausea with "retching" and some vomiting early morning.  Today: Patient states that she is feeling horrible. Her abdomen is tender and she cannot describe further and gestures all over for location. She was getting on commode for bowel movement at time of exam so I will reexamine at later time. I did see frank blood and mucus in commode from previous BM this morning before leaving. GI saw her this morning  Objective: Blood pressure (!) 165/83, pulse 82, temperature 98 F (36.7 C), resp. rate (!) 23, height 5\' 2"  (1.575 m), weight 59 kg, SpO2 97 %.  Physical Exam: General: NAD, pleasant Cardiovascular: RRR Respiratory: CTAB Abdomen: soft, non distended, non tender to palpation, no distended bladder palpated Extremities: no tenderness, no bruising, redness or swelling. Eyes: PERRLA, no gross abnormalities or lesions seen on cornea. No FB seen. Eyes are watery on exam.  Assessment and Plan:  VANNAH NADAL a 77 y.o.femalepresenting with colitis  Inflammatory colitis with hematochezia GI consulted performed colonoscopy 8/19. Showed signs of IBD Biopsies showed Ulcerative colitis. Denies BRBPR or BM today. Hgb stable. Cdiff and GI panel negative. GI commented that her reoccurance of symptoms is likely due to Masalamine. 5-ASA products can have reverse effect ie cause colitis so he  discontinued this. She was otherwise progressing well. - vitals per unit routine - continue solumedrol -dc Lialda - zofran PRN - tums PRN - Appreciate GI recommendations. Continue Solumedrol until more formed stools. Mesalamine dc.   Acute Anemia, unstable. rectal blood loss from colitis exacerbation likely 2/2 atypical reaction to 5ASA medication. Hold heparin/enoxaparin. Monitor for hemodynamic instability.  -repeat CBC -hold heparin   Fall, mechanical, while admitted (8/19). Physical exam unremarkable. Will monitor, fall precautions. Discussed with nursing. Safety Zone Portal event filed by nursing staff.  -patient on video monitoring  Fever,-resolved likely related to colitis, differential includes bacteremia, UTI. No signs of pneumonia. Discontinued Flagyl. Continue to follow cultures- no growth at 3 days. -monitor vitals  HSV-2keratitis  Complains of discomfort and requested steroid eye drops. Currently on treatment dosing PO. Did not see abnormalities on exam -Continue home medication valacyclovir -ordered lubricating eye drops  Essential HTN- BP elevated today 165/83 today likely raised due to stress of bloody stool and vomiting reoccurrence. holding home HCTZ and lisinopril due to oral medications and patient is throwing up - AKI has resolved -added hydralazine IV 5mg  q6 PRN - monitor BP  Major depression and bipolar disorder- Stable. No mood instability this am. Multiple psych meds on home med list includingmethylphenidate, mirtazapine, clonazepam, venlafaxine and zolpidem. Clarified home regimen with outpatient pharmacy shortly after admission.  - continue home mirtazapine - monitor  Urinary retention-patient on foley- will need a void trial when removed tomorrow  AKI, resolved -IV fluid NS 62mL/hr -monitor daily bmet    Hyponatremia, resolved s/p IV hydration.   - monitor on BMP  Hyperglycemia patient has no history of diabetes. Her blood sugar levels  have been elevated to  194 today. Added a sSSI today plus a 5 unit novolog. Patient's glucose is probably elevated due to steroid use. A1c on admission is 6.5 -continue to follow CBGs on admission -added sSSI -needs to follow up with PCP outpatient   FEN/GI: full diet, 8075mL/hr NS, can consider d/c if good oral intake and BP stable  PPx: heparin   Disposition: unstable, continue admission and observation on medsurg Consider PT evaluation.   Laboratory: Recent Labs  Lab 04/27/18 0548 04/28/18 0928 04/29/18 0246  WBC 8.4 15.8* 11.6*  HGB 9.3* 9.8* 8.7*  HCT 27.5* 28.8* 26.1*  PLT 447* 533* 488*   Recent Labs  Lab 04/22/18 1633  04/27/18 0548 04/28/18 0928 04/29/18 0246  NA 131*   < > 141 142 140  K 3.5   < > 3.1* 4.2 4.3  CL 96*   < > 112* 116* 117*  CO2 24   < > 20* 20* 20*  BUN 26*   < > 21 28* 28*  CREATININE 1.36*   < > 0.71 1.02* 0.91  CALCIUM 8.2*   < > 7.4* 7.6* 7.2*  PROT 6.9  --   --   --   --   BILITOT 0.9  --   --   --   --   ALKPHOS 58  --   --   --   --   ALT 22  --   --   --   --   AST 30  --   --   --   --   GLUCOSE 118*   < > 165* 194* 188*   < > = values in this interval not displayed.    Imaging/Diagnostic Tests: Colonoscopy biopsy showed ulcerative colitis  Leeroy Bocknderson, Chelsey L, DO 04/29/2018, 7:04 AM PGY-1, [facility] FPTS Intern pager: [phone], text pages welcome

## 2018-04-29 NOTE — Progress Notes (Signed)
Informed Family medicine residents that patient is vomiting and having nausea but there is no antemetic ordered and the patient is having extreme ABD pain but states the tylenol will not help. Per resident they will be rounding on patient shortly.

## 2018-04-29 NOTE — Progress Notes (Signed)
Subjective: Diarrhea, hematochezia, and abdominal pain.  This started 30 minutes ago.  Objective: Vital signs in last 24 hours: Temp:  [97.8 F (36.6 C)-98.1 F (36.7 C)] 98 F (36.7 C) (08/22 0456) Pulse Rate:  [74-89] 82 (08/22 0614) Resp:  [18-23] 23 (08/22 0456) BP: (133-181)/(70-110) 165/83 (08/22 0614) SpO2:  [96 %-97 %] 97 % (08/22 0456) Last BM Date: 04/28/18  Intake/Output from previous day: 08/21 0701 - 08/22 0700 In: 3013.3 [P.O.:542; I.V.:2471.3] Out: 1200 [Urine:1200] Intake/Output this shift: No intake/output data recorded.  General appearance: alert and no distress GI: diffuse tenderness, no rebound or rigidity  Lab Results: Recent Labs    04/27/18 0548 04/28/18 0928 04/29/18 0246  WBC 8.4 15.8* 11.6*  HGB 9.3* 9.8* 8.7*  HCT 27.5* 28.8* 26.1*  PLT 447* 533* 488*   BMET Recent Labs    04/27/18 0548 04/28/18 0928 04/29/18 0246  NA 141 142 140  K 3.1* 4.2 4.3  CL 112* 116* 117*  CO2 20* 20* 20*  GLUCOSE 165* 194* 188*  BUN 21 28* 28*  CREATININE 0.71 1.02* 0.91  CALCIUM 7.4* 7.6* 7.2*   LFT No results for input(s): PROT, ALBUMIN, AST, ALT, ALKPHOS, BILITOT, BILIDIR, IBILI in the last 72 hours. PT/INR No results for input(s): LABPROT, INR in the last 72 hours. Hepatitis Panel No results for input(s): HEPBSAG, HCVAB, HEPAIGM, HEPBIGM in the last 72 hours. C-Diff No results for input(s): CDIFFTOX in the last 72 hours. Fecal Lactopherrin No results for input(s): FECLLACTOFRN in the last 72 hours.  Studies/Results: No results found.  Medications:  Scheduled: . feeding supplement  1 Container Oral TID BM  . feeding supplement (PRO-STAT SUGAR FREE 64)  30 mL Oral Daily  . folic acid  1 mg Oral Daily  . heparin injection (subcutaneous)  5,000 Units Subcutaneous Q8H  . insulin aspart  0-9 Units Subcutaneous TID WC  . lidocaine  1 application Urethral Once  . methylPREDNISolone (SOLU-MEDROL) injection  40 mg Intravenous Q12H  . mirtazapine   30 mg Oral QHS  . multivitamin with minerals  1 tablet Oral Daily  . thiamine  100 mg Oral Daily  . valACYclovir  500 mg Oral BID   Continuous: . sodium chloride    . sodium chloride 75 mL/hr at 04/28/18 2128    Assessment/Plan: 1) Left sided UC. 2) Recurrence of symptoms.   It is likely that her nausea, vomiting, and bloody diarrhea are as a result of Lialda.  5-ASA products can have the reverse effect, I.e., causing a colitis.  This is the only new medication.  Prior she was progressing very well.  Plan: 1) Continue with Solumedrol. 2) D/C Lialda.   LOS: 6 days   Janice Ramos D 04/29/2018, 7:19 AM

## 2018-04-29 NOTE — Progress Notes (Signed)
Initial Nutrition Assessment  INTERVENTION:   - d/c Boost Breeze as pt does not like this supplement  - Ensure Enlive po TID, each supplement provides 350 kcal and 20 grams of protein (vanilla flavor)  - Continue Pro-stat daily, each supplement provides 100 kcal and 15 grams of protein  - Encourage adequate PO intake  NUTRITION DIAGNOSIS:   Inadequate oral intake related to altered GI function as evidenced by meal completion < 50%.  Progressing  GOAL:   Patient will meet greater than or equal to 90% of their needs  Progressing  MONITOR:   PO intake, Supplement acceptance, Labs, Weight trends, I & O's, Skin  REASON FOR ASSESSMENT:   Malnutrition Screening Tool    ASSESSMENT:   77 y.o. female with history of diabetes and IBS presenting with 2 months of gradually worsening vomiting and diarrhea.  CT abdomen pelvis with contrast showed wall thickening and inflammatory change in the descending and sigmoid colons most consistent with an inflammatory colitis.   8/17 - diet advanced to clear liquids, pt declined golytely 8/19 - s/p colonoscopy with appearance consistent with IBD, distribution favors UC  Spoke with pt at bedside who reports that she has not been eating today due to vomiting. Pt states she is doing very poorly at time of visit. Pt reports, "this is a setback for me."  Pt states she does not like the Boost Breeze because it is "sickly sweet." Pt is interested in switching to Ensure Enlive, vanilla or strawberry flavors.  Meal Completion: ~50%  Medications reviewed and include: Tums BID, Boost Breeze TID - pt refusing, 20 ml Pro-stat daily - pt refusing, 1 mg folic acid daily, sliding scale Novolog, 30 mg Remeron daily, MVI with minerals daily, 100 mg thiamine daily  Labs reviewed: hemoglobin 8.7 (L), HCT 26.1 (L) CBG's: 148, 173, 253, 186 x 24 hours  UOP: 1200 ml x 24 hours I/O's: +12.1 L since admission  Diet Order:   Diet Order            Diet heart  healthy/carb modified Room service appropriate? Yes; Fluid consistency: Thin  Diet effective now              EDUCATION NEEDS:   Not appropriate for education at this time  Skin:  Skin Assessment: Reviewed RN Assessment  Last BM:  04/29/18 - large type 7  Height:   Ht Readings from Last 1 Encounters:  04/23/18 5\' 2"  (1.575 m)    Weight:   Wt Readings from Last 1 Encounters:  04/23/18 59 kg    Ideal Body Weight:  50 kg  BMI:  Body mass index is 23.79 kg/m.  Estimated Nutritional Needs:   Kcal:  1550-1750  Protein:  70-85 grams  Fluid:  >/= 1.5 L/day    Janice ReadingKate Ramos Janice Demos, MS, RD, LDN Pager: [phone] Weekend/After Hours: [phone]

## 2018-04-30 ENCOUNTER — Inpatient Hospital Stay (HOSPITAL_COMMUNITY): Payer: Medicare Other

## 2018-04-30 DIAGNOSIS — R140 Abdominal distension (gaseous): Secondary | ICD-10-CM | POA: Insufficient documentation

## 2018-04-30 DIAGNOSIS — K51811 Other ulcerative colitis with rectal bleeding: Secondary | ICD-10-CM

## 2018-04-30 LAB — BASIC METABOLIC PANEL
Anion gap: 4 — ABNORMAL LOW (ref 5–15)
BUN: 20 mg/dL (ref 8–23)
CO2: 25 mmol/L (ref 22–32)
CREATININE: 0.73 mg/dL (ref 0.44–1.00)
Calcium: 7.4 mg/dL — ABNORMAL LOW (ref 8.9–10.3)
Chloride: 107 mmol/L (ref 98–111)
GFR calc Af Amer: >60 mL/min (ref >60)
GLUCOSE: 157 mg/dL — AB (ref 70–99)
POTASSIUM: 4.9 mmol/L (ref 3.5–5.1)
SODIUM: 136 mmol/L (ref 135–145)

## 2018-04-30 LAB — CBC
HCT: 24.6 % — ABNORMAL LOW (ref 36.0–46.0)
Hemoglobin: 8.3 g/dL — ABNORMAL LOW (ref 12.0–15.0)
MCH: 31.1 pg (ref 26.0–34.0)
MCHC: 33.7 g/dL (ref 30.0–36.0)
MCV: 92.1 fL (ref 78.0–100.0)
PLATELETS: 460 10*3/uL — AB (ref 150–400)
RBC: 2.67 MIL/uL — ABNORMAL LOW (ref 3.87–5.11)
RDW: 14.8 % (ref 11.5–15.5)
WBC: 14.4 10*3/uL — ABNORMAL HIGH (ref 4.0–10.5)

## 2018-04-30 LAB — C DIFFICILE QUICK SCREEN W PCR REFLEX
C DIFFICILE (CDIFF) INTERP: NOT DETECTED
C Diff antigen: NEGATIVE
C Diff toxin: NEGATIVE

## 2018-04-30 LAB — GLUCOSE, CAPILLARY
GLUCOSE-CAPILLARY: 150 mg/dL — AB (ref 70–99)
GLUCOSE-CAPILLARY: 175 mg/dL — AB (ref 70–99)
GLUCOSE-CAPILLARY: 250 mg/dL — AB (ref 70–99)
Glucose-Capillary: 209 mg/dL — ABNORMAL HIGH (ref 70–99)

## 2018-04-30 MED ORDER — HYDROCHLOROTHIAZIDE 12.5 MG PO CAPS
12.5000 mg | ORAL_CAPSULE | Freq: Every day | ORAL | Status: DC
  Administered 2018-04-30 – 2018-05-03 (×4): 12.5 mg via ORAL
  Filled 2018-04-30 (×4): qty 1

## 2018-04-30 MED ORDER — VALACYCLOVIR HCL 500 MG PO TABS
1000.0000 mg | ORAL_TABLET | Freq: Three times a day (TID) | ORAL | Status: DC
  Administered 2018-04-30 – 2018-05-10 (×30): 1000 mg via ORAL
  Filled 2018-04-30 (×31): qty 2

## 2018-04-30 MED ORDER — SIMETHICONE 80 MG PO CHEW
80.0000 mg | CHEWABLE_TABLET | Freq: Three times a day (TID) | ORAL | Status: DC | PRN
  Administered 2018-05-01 – 2018-05-07 (×4): 80 mg via ORAL
  Filled 2018-04-30 (×4): qty 1

## 2018-04-30 MED ORDER — LISINOPRIL 10 MG PO TABS
10.0000 mg | ORAL_TABLET | Freq: Every day | ORAL | Status: DC
  Administered 2018-04-30 – 2018-05-10 (×11): 10 mg via ORAL
  Filled 2018-04-30 (×12): qty 1

## 2018-04-30 NOTE — Progress Notes (Addendum)
Family Medicine Teaching Service Daily Progress Note Intern Pager: [phone]  Patient name: Janice Ramos Medical record number: [id number] Date of birth: [birth year] Age: 77 y.o. Gender: female  Primary Care Provider: Joana Reamer, DO Consultants: GI, ophthalmology Code Status: DNR  Pt Overview and Major Events to Date:  8/16- admitted for worsening vomiting and diarrhea 8/19- colonoscopy 8/21- diagnosed ulcerative colitis 8/22- restart of BRBPR 8/23- seen by ophthalmology, valtrex increased to treatment dose  Assessment and Plan: Janice Ramos a 77 y.o.femal﻿epresenting with colitis. 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  Inflammatory colitis with hematochezia  -  Continues to have abdominal pain and distension today, but no rebound tenderness, non-surgical abdomen. She is continuing to have liquid, bloody diarrhea. 8/19 colonoscopy c/w UC, exacerbation thought to be due to 5ASA medication, mesalamine. GI following. Patient experienced initial improvement but then worsening abdominal pain and recurrence of bloody diarrhea 8/23. Concern for possible c. Diff colitis, however PCR on 8/23 was negative. - continue solumedrol until stools are more formed - Abd XR from 8/23 with diffuse small and large bowel gaseous distension without mechanical obstruction identified, similar with prior studies - will follow up GI recs - likely will need repeat CT abdomen w/contrast today. Will hold off ordering until BMP results to review kidney function prior to giving contrast.  Acute Anemia - Hgb pending from 8.3 yesterday, trending down over several days. No symptoms of anemia. Likely due to rectal losses in setting of colitis exacerbation. Transfusion threshold 7.0. Hold heparin/enoxaparin. Monitor for hemodynamic instability.  - monitor CBC  Urinary Retention - thought to be due to  anticholinergic medications. Foley placed 8/21 early AM. Urology recommended 3 days with foley. - DC foley this AM - void trial. Spoke with nursing.  Fall, mechanical, while admitted (8/19). No concerning physical exam findings after the fall. See separate progress note from that date. -patient on video monitoring  HSV-2keratitis  - ophthalmology consulted d/t complaints of ocular irritation while inpatient.   - treatment dose valtrex 1g TID - artificial tears QID - follow up with [facility] immediately after DC to assess any residual AC inflammation - call ophtho if patient worsens  Essential HTN-BP150/74. Home HCTZ 12.5 mg daily, Lisinopril 10 mg PO daily at home. - continue home HCTZ and lisinopril   Major depression and bipolar disorder- Stable. Home med list includingmethylphenidate, mirtazapine, clonazepam, venlafaxine and zolpidem. Clarified home regimen with outpatient pharmacy shortly after admission.  - continue home mirtazapine - monitor  Hyperglycemia patient has no history of diabetes. Blood sugar likely in setting of steroid use this admission. - CBG 209-250, received 6u SSI yesterday - sliding scale insulin  FEN/GI: full diet, 24mL/hr NS, can consider d/c if good oral intake and BP stable PPx: SCD. No heparin due to BRBPR  Disposition: Patient requires inpatient status due to continued management of BRBPR and anemia. Anticipate at least 2-3 more inpatient days.  Subjective:  No acute events overnight. Patient continues to feel bloated and uncomfortable this morning. She states that she feels slightly worse than yesterday evening. No point abdominal tenderness, but she does endorse generalized abdominal discomfort. Last BM was overnight, and it was charted as bloody diarrhea. She is not having nausea.   Objective: Temp:  [97.8 F (36.6 C)-98.2 F (36.8 C)] 98.2 F (36.8 C) (08/23 8413) Pulse Rate:  [60-80] 80 (08/23 2058) Resp:  [20]  20 (08/23  1500) BP: (150-163)/(74-92) 150/74 (08/23 2058) SpO2:  [97 %-99 %] 97 % (08/23 2058) Physical Exam: General: NAD, mildly uncomfortable but nontoxic, speaking in full sentences and pleasant Cardiovascular: RRR, no m/r/g Respiratory: CTA bil, no W/R/R Abdomen: +bowel sounds, soft, mildly distended, mildly tender to light palpation diffusely. No HSM.  Extremities: 1+ edema LE bilaterally  Laboratory: Recent Labs  Lab 04/28/18 0928 04/29/18 0246 04/30/18 0542  WBC 15.8* 11.6* 14.4*  HGB 9.8* 8.7* 8.3*  HCT 28.8* 26.1* 24.6*  PLT 533* 488* 460*   Recent Labs  Lab 04/28/18 0928 04/29/18 0246 04/30/18 0542  NA 142 140 136  K 4.2 4.3 4.9  CL 116* 117* 107  CO2 20* 20* 25  BUN 28* 28* 20  CREATININE 1.02* 0.91 0.73  CALCIUM 7.6* 7.2* 7.4*  GLUCOSE 194* 188* 157*   Imaging/Diagnostic Tests: Dg Abd 1 View Result Date: 04/30/2018  IMPRESSION: 1. No obstruction or ileus.  Dg Abd Acute W/chest Result Date: 04/30/2018 IMPRESSION:  1. Confluent retrocardiac opacity at the left lung base with blunting of the left costophrenic angle. Atelectasis and/or pneumonia are potential etiologies with small left effusion.  2. Nonspecific, nonobstructed bowel gas pattern with mild-to-moderate gaseous distention of small and large bowel loops to the level of the rectum. Findings are similar in appearance to prior.   Howard PouchFeng, Tiny Chaudhary, MD 04/30/2018, 10:49 PM PGY-3, [facility] FPTS Intern pager: [phone], text pages welcome

## 2018-04-30 NOTE — Progress Notes (Signed)
Subjective: Still feeling poorly.  She reports bloody diarrhea.  Objective: Vital signs in last 24 hours: Temp:  [97.9 F (36.6 C)-98.4 F (36.9 C)] 97.9 F (36.6 C) (08/23 0448) Pulse Rate:  [68-74] 68 (08/23 0448) Resp:  [20] 20 (08/23 0448) BP: (163-170)/(84-92) 163/92 (08/23 0448) SpO2:  [97 %-99 %] 99 % (08/23 0448) Last BM Date: 04/30/18  Intake/Output from previous day: 08/22 0701 - 08/23 0700 In: 1962.9 [P.O.:222; I.V.:1740.9] Out: 2052 [Urine:2050; Stool:2] Intake/Output this shift: No intake/output data recorded.  General appearance: alert, no distress and uncomfortable appearing GI: soft, non-tender; bowel sounds normal; no masses,  no organomegaly  Lab Results: Recent Labs    04/28/18 0928 04/29/18 0246 04/30/18 0542  WBC 15.8* 11.6* 14.4*  HGB 9.8* 8.7* 8.3*  HCT 28.8* 26.1* 24.6*  PLT 533* 488* 460*   BMET Recent Labs    04/28/18 0928 04/29/18 0246 04/30/18 0542  NA 142 140 136  K 4.2 4.3 4.9  CL 116* 117* 107  CO2 20* 20* 25  GLUCOSE 194* 188* 157*  BUN 28* 28* 20  CREATININE 1.02* 0.91 0.73  CALCIUM 7.6* 7.2* 7.4*   LFT No results for input(s): PROT, ALBUMIN, AST, ALT, ALKPHOS, BILITOT, BILIDIR, IBILI in the last 72 hours. PT/INR No results for input(s): LABPROT, INR in the last 72 hours. Hepatitis Panel No results for input(s): HEPBSAG, HCVAB, HEPAIGM, HEPBIGM in the last 72 hours. C-Diff No results for input(s): CDIFFTOX in the last 72 hours. Fecal Lactopherrin No results for input(s): FECLLACTOFRN in the last 72 hours.  Studies/Results: No results found.  Medications:  Scheduled: . calcium carbonate  1 tablet Oral BID  . feeding supplement (ENSURE ENLIVE)  237 mL Oral TID BM  . feeding supplement (PRO-STAT SUGAR FREE 64)  30 mL Oral Daily  . folic acid  1 mg Oral Daily  . insulin aspart  0-9 Units Subcutaneous TID WC  . lidocaine  1 application Urethral Once  . methylPREDNISolone (SOLU-MEDROL) injection  40 mg Intravenous  Q12H  . mirtazapine  30 mg Oral QHS  . multivitamin with minerals  1 tablet Oral Daily  . thiamine  100 mg Oral Daily  . valACYclovir  1,000 mg Oral TID   Continuous: . sodium chloride    . sodium chloride 75 mL/hr at 04/29/18 2212    Assessment/Plan: 1) Left sided colitis. 2) Recurrent hematochezia.     If the worsening of her symptoms are secondary to Lialda, it is lasting long than expected.  Overall she does not appear to be any worse than yesterday and in some respects, she may have some mild improvement.  Palpation of the abdomen was negative for any significant abdominal pain.  Plan: 1) Continue with Solumedrol. 2) If there is no significant improvement tomorrow, a repeat CT scan needs to be performed.  Her WBC has also increased and C. Diff always remains a concern being that she continues to be in the hospital.  It may be worthwhile to recheck this issue tomorrow if there is no improvement.  LOS: 7 days   Jarrod Bodkins D 04/30/2018, 8:54 AM

## 2018-04-30 NOTE — Consult Note (Signed)
OPHTHALMOLOGY CONSULT NOTE  Date:  04/30/18 Time: 11:38 AM  Patient Name: Janice Ramos  DOB: [birth year] MRN: [id number]  Reason for Consult: Ocular herpes HPI:  This is a 77 y.o. female who is patient of [facility] recently being treated for bilateral HSV related inflammation.   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 Patient has been tapered off of steroids ou and complains a a several day history of occular irritation. Patient is admitted for GI bleed.    Prior to Admission medications   Medication Sig Start Date End Date Taking? Authorizing Provider  ALPRAZolam Prudy Feeler) 1 MG tablet Take 1 mg by mouth 3 (three) times daily as needed for anxiety. Can take an additional tablet at bedtime for sleep. 01/15/18  Yes [provider]  hydrochlorothiazide (HYDRODIURIL) 25 MG tablet Take 1 tablet (25 mg total) by mouth daily. 04/15/18  Yes [name], MD  hydrOXYzine (ATARAX/VISTARIL) 10 MG tablet Take 1 tablet (10 mg total) by mouth 3 (three) times daily as needed. Patient taking differently: Take 10 mg by mouth 3 (three) times daily as needed for anxiety.  01/22/17  Yes Beaulah Dinning, MD  lisinopril (PRINIVIL,ZESTRIL) 10 MG tablet Take 1 tablet (10 mg total) by mouth daily. 04/15/18  Yes [name], MD  methylphenidate (RITALIN) 20 MG tablet Take 20 mg by mouth daily.  01/15/18  Yes [provider]  mirtazapine (REMERON) 30 MG tablet Take 1 tablet (30 mg total) by mouth at bedtime. 01/26/18  Yes [name], MD  valACYclovir (VALTREX) 500 MG tablet Take 1 tablet (500 mg total) by mouth 2 (two) times daily. 07/04/17  Yes Elvina Sidle, MD  vitamin C (ASCORBIC ACID) 500 MG tablet Take 500 mg by mouth daily.   Yes [provider]    Past Medical History:  Diagnosis Date  . Allergy   . Anxiety   . Bipolar 1 disorder (HCC)   . Depression   . Diabetes mellitus   . Diarrhea 04/2018  . Genital herpes in  women 1983  . Hyperlipidemia   . Irritable bowel syndrome (IBS)   . Overdose of benzodiazepine 06/2005   Xanax  . Sinus disorder     family history includes Cancer in her mother; Suicidality in her father.  Social History   Occupational History  . Occupation: Retired    Comment: Artist  Tobacco Use  . Smoking status: Never Smoker  . Smokeless tobacco: Never Used  Substance and Sexual Activity  . Alcohol use: Yes    Alcohol/week: 0.0 standard drinks    Comment: 1 x per year  . Drug use: No  . Sexual activity: Not on file    Allergies  Allergen Reactions  . Latex Hives    ROS: Positive as above, otherwise negative.  EXAM:  Mental Status: A&O x 3   Base Exam: Right Eye Left Eye  Visual Acuity (At near) 20/25 20/30+  IOP (Tonopen) 17 18  Pupillary Exam No RAPD (miotic ou) No RAPD  Motility Full  Full   Confrontation VF Full  Full    Anterior Segment Exam    Lids/Lashes WNL WNL  Conjuctiva White and Quiet White and Quiet  Cornea Clear, inferior pannus. No epi defect/dendrites ou Clear  Anterior Chamber Deep and Quiet Deep and Quiet  Iris Round, Reactive Round, Reactive  Lens Clear Clear  Vitreous WNL WNL   Poster Segment Exam    Disc Good RR Good RR  CD ratio    Macula    Vessels    Periphery     Radiographic Studies Reviewed: Dg Abd 1 View  Result Date: 04/30/2018 CLINICAL DATA:  Upper abdominal pain and distention for the past 2 weeks. Recent colonoscopy 4 days ago. EXAM: ABDOMEN - 1 VIEW COMPARISON:  CT abdomen pelvis dated April 23, 2018. FINDINGS: Gas throughout a nondistended colon. No dilated loops of small bowel. No definite pneumoperitoneum, although evaluation is limited on the single supine view. No acute osseous abnormality. Mild degenerative changes of the lower lumbar spine. IMPRESSION: 1. No obstruction or ileus. Electronically Signed   By: Obie DredgeWilliam T Derry M.D.   On: 04/30/2018 10:22    Assessment and Recommendation: 1. Hx HSV related  ocular inflamation: Would not recommend restarting steroids at this point given that the eye appears quiet. OK to increase Valtrex to treatment dose of 1g TID. Rec artificial tears QID ou. Primary team to [facility] after [facility] to assess any residual AC inflamation. Please call if patient worsens.   Please call with any questions.  Sinda DuBradley Jinny Sweetland MD [facility] [phone]

## 2018-04-30 NOTE — Progress Notes (Signed)
FMTS Attending Daily Note:  Janice Don MD  [phone] pager  Family Practice pager:  [phone] I have seen and examined this patient and have reviewed their chart. I have discussed this patient with the resident. I agree with the resident's findings, assessment and care plan.  Additionally:  - Patient feels marginally better today.  No vomiting.  Able to eat some breakfast.  Still with residual abdominal pain.  Also a few more episodes of blood diarrhea.   - Awake and alert.  Distended belly, but not a surgical abdomen. General, diffuse, mild tenderness without focal findings or guarding.   Plan: - continue anti-emetics - Checking C dif.  She is s/p abx and prolonged hospital stay.   - Hoping she will continue to improve with mesalamine out of her system.   - Appreciate ophto input.  Continue higher dose Valtrex.  Needs eye patient eye exam.  Hold on ocular steroids.   - DC Foley tomorrow.   Janice Grim, MD 04/30/2018 4:40 PM       Family Medicine Teaching Service Daily Progress Note Intern Pager: [phone]  Patient name: Janice Ramos Medical record number: [id number] Date of birth: 1941-03-05 Age: 77 y.o. Gender: female  Primary Care Provider: Joana Reamer, DO  Code Status: DNR  Consultations: GI Admission date: 8/15  Pt Overview and Major Events to Date:  8/16- admitted for worsening vomiting and diarrhea 8/19- colonoscopy 8/21- diagnosed ulcerative colitis 8/22- restart of BRBPR 8/23- seen by ophthalmology  Subjective: Overnight: patient was still on foley catheter with good urine output. Patient had reoccurrence of bleeding per rectum, nausea with "retching" and some vomiting early morning.  Today: Patient states that she is feeling horrible. Her abdomen is tender and she cannot describe further and gestures all over for location. She was getting on commode for bowel movement at time of exam so I will reexamine at later time. I did see frank blood and  mucus in commode from previous BM this morning before leaving. GI saw her this morning  Objective: Blood pressure (!) 165/83, pulse 82, temperature 98 F (36.7 C), resp. rate (!) 23, height 5\' 2"  (1.575 m), weight 59 kg, SpO2 97 %.  Physical Exam: General: NAD, pleasant Cardiovascular: RRR Respiratory: CTAB Abdomen: soft, non distended, non tender to palpation, no distended bladder palpated Extremities: no tenderness, no bruising, redness or swelling. Eyes: PERRLA, no gross abnormalities or lesions seen on cornea. No FB seen. Eyes are watery on exam.  Assessment and Plan:  Janice Ramos a 77 y.o.femalepresenting with colitis  Inflammatory colitis with hematochezia GI consulted performed colonoscopy 8/19. Showed signs of IBD Biopsies showed Ulcerative colitis. Denies BRBPR or BM today. Hgb stable. Cdiff and GI panel negative. GI commented that her reoccurance of symptoms is likely due to Masalamine. 5-ASA products can have reverse effect ie cause colitis so he discontinued this. She was otherwise progressing well. - vitals per unit routine - continue solumedrol -dc Lialda - zofran PRN - tums PRN - Appreciate GI recommendations. Continue Solumedrol until more formed stools. Mesalamine dc.   Acute Anemia, unstable. rectal blood loss from colitis exacerbation likely 2/2 atypical reaction to 5ASA medication. Hold heparin/enoxaparin. Monitor for hemodynamic instability.  -repeat CBC -hold heparin   Fall, mechanical, while admitted (8/19). Physical exam unremarkable. Will monitor, fall precautions. Discussed with nursing. Safety Zone Portal event filed by nursing staff.  -patient on video monitoring  Fever,-resolved likely related to colitis, differential includes bacteremia, UTI. No signs of pneumonia.  Discontinued Flagyl. Continue to follow cultures- no growth at 3 days. -monitor vitals  HSV-2keratitis  Complains of discomfort and requested steroid eye drops. Currently on  treatment dosing PO. Did not see abnormalities on exam -Continue home medication valacyclovir -ordered lubricating eye drops  Essential HTN- BP elevated today 165/83 today likely raised due to stress of bloody stool and vomiting reoccurrence. holding home HCTZ and lisinopril due to oral medications and patient is throwing up - AKI has resolved -added hydralazine IV 5mg  q6 PRN - monitor BP  Major depression and bipolar disorder- Stable. No mood instability this am. Multiple psych meds on home med list includingmethylphenidate, mirtazapine, clonazepam, venlafaxine and zolpidem. Clarified home regimen with outpatient pharmacy shortly after admission.  - continue home mirtazapine - monitor  Urinary retention-patient on foley- will need a void trial when removed tomorrow  AKI, resolved -IV fluid NS 6075mL/hr -monitor daily bmet    Hyponatremia, resolved s/p IV hydration.   - monitor on BMP  Hyperglycemia patient has no history of diabetes. Her blood sugar levels have been elevated to 194 today. Added a sSSI today plus a 5 unit novolog. Patient's glucose is probably elevated due to steroid use. A1c on admission is 6.5 -continue to follow CBGs on admission -added sSSI -needs to follow up with PCP outpatient   FEN/GI: full diet, 3475mL/hr NS, can consider d/c if good oral intake and BP stable  PPx: heparin   Disposition: unstable, continue admission and observation on medsurg Consider PT evaluation.   Laboratory: Recent Labs  Lab 04/28/18 0928 04/29/18 0246 04/30/18 0542  WBC 15.8* 11.6* 14.4*  HGB 9.8* 8.7* 8.3*  HCT 28.8* 26.1* 24.6*  PLT 533* 488* 460*   Recent Labs  Lab 04/27/18 0548 04/28/18 0928 04/29/18 0246  NA 141 142 140  K 3.1* 4.2 4.3  CL 112* 116* 117*  CO2 20* 20* 20*  BUN 21 28* 28*  CREATININE 0.71 1.02* 0.91  CALCIUM 7.4* 7.6* 7.2*  GLUCOSE 165* 194* 188*    Imaging/Diagnostic Tests: Colonoscopy biopsy showed ulcerative colitis  Janice Ramos,  Janice L, DO 04/30/2018, 7:01 AM PGY-1, [facility] FPTS Intern pager: [phone], text pages welcome

## 2018-04-30 NOTE — Progress Notes (Signed)
FPTS Interim Progress Note  S:Paged by nurse as patient complaining of worsening abdominal bloating and discomfort.  Pt seen and examined at bedside.  Patient states that she feels very bloated and has not been able to sleep due to discomfort.  She states that pain is unchanged since this AM.  Tums has not provided relief.  She notes diarrhea a few minutes prior to examination.    O: BP (!) 150/74 (BP Location: Left Arm)   Pulse 80   Temp 98.2 F (36.8 C) (Oral)   Resp 20   Ht 5\' 2"  (1.575 m)   Wt 59 kg   SpO2 97%   BMI 23.79 kg/m    General: In NAD, resting comfortably Abd: Distended, no fluid wave, diffuse mild tenderness to palpation, no rebound tenderness, normoactive bowel sounds  A/P: - will give Gas-X TID PRN to help with bloating - no concern for acute abdomen at this time due to physical exam - if worsens, patient advised to let the nurse know to contact us, GI note states would recommend repeat CT scan  Unknown JimMeccariello, Braxton Weisbecker J, DO 04/30/2018, 11:39 PM PGY-1, [facility] pager [phone]

## 2018-04-30 NOTE — Progress Notes (Signed)
PT Cancellation Note  Patient Details Name: Janice Ramos MRN: [id number] DOB: 19-Dec-1940   Cancelled Treatment:    Reason Eval/Treat Not Completed: Pain limiting ability to participate Reports pain in abdomen and politely declines PT today. Will follow up.   Blake DivineShauna A Latarsha Zani 04/30/2018, 11:23 AM Mylo RedShauna Ashira Kirsten, PT, DPT [phone]

## 2018-05-01 DIAGNOSIS — D649 Anemia, unspecified: Secondary | ICD-10-CM

## 2018-05-01 LAB — GLUCOSE, CAPILLARY
GLUCOSE-CAPILLARY: 165 mg/dL — AB (ref 70–99)
GLUCOSE-CAPILLARY: 217 mg/dL — AB (ref 70–99)
Glucose-Capillary: 212 mg/dL — ABNORMAL HIGH (ref 70–99)
Glucose-Capillary: 189 mg/dL — ABNORMAL HIGH (ref 70–99)

## 2018-05-01 LAB — C-REACTIVE PROTEIN: CRP: 1.6 mg/dL — AB (ref <1.0)

## 2018-05-01 LAB — HEPATIC FUNCTION PANEL
ALT: 38 U/L (ref 0–44)
AST: 22 U/L (ref 15–41)
Albumin: 1.8 g/dL — ABNORMAL LOW (ref 3.5–5.0)
Alkaline Phosphatase: 57 U/L (ref 38–126)
BILIRUBIN DIRECT: 0.1 mg/dL (ref 0.0–0.2)
Indirect Bilirubin: 0.5 mg/dL (ref 0.3–0.9)
Total Bilirubin: 0.6 mg/dL (ref 0.3–1.2)
Total Protein: 5 g/dL — ABNORMAL LOW (ref 6.5–8.1)

## 2018-05-01 LAB — CBC
HCT: 24.4 % — ABNORMAL LOW (ref 36.0–46.0)
Hemoglobin: 8.1 g/dL — ABNORMAL LOW (ref 12.0–15.0)
MCH: 30.9 pg (ref 26.0–34.0)
MCHC: 33.2 g/dL (ref 30.0–36.0)
MCV: 93.1 fL (ref 78.0–100.0)
PLATELETS: 534 10*3/uL — AB (ref 150–400)
RBC: 2.62 MIL/uL — ABNORMAL LOW (ref 3.87–5.11)
RDW: 14.8 % (ref 11.5–15.5)
WBC: 17.7 10*3/uL — ABNORMAL HIGH (ref 4.0–10.5)

## 2018-05-01 LAB — BASIC METABOLIC PANEL
Anion gap: 8 (ref 5–15)
BUN: 18 mg/dL (ref 8–23)
CALCIUM: 7.5 mg/dL — AB (ref 8.9–10.3)
CO2: 26 mmol/L (ref 22–32)
CREATININE: 0.66 mg/dL (ref 0.44–1.00)
Chloride: 100 mmol/L (ref 98–111)
GFR calc Af Amer: >60 mL/min (ref >60)
GLUCOSE: 169 mg/dL — AB (ref 70–99)
POTASSIUM: 4.5 mmol/L (ref 3.5–5.1)
Sodium: 134 mmol/L — ABNORMAL LOW (ref 135–145)

## 2018-05-01 LAB — SEDIMENTATION RATE: Sed Rate: 65 mm/hr — ABNORMAL HIGH (ref 0–22)

## 2018-05-01 NOTE — Progress Notes (Signed)
Patient complaining of increased abd distension this morning.  States, "It has never been this bad!"  No associated nausea or vomiting and, in fact, states she is very hungry.  MD notified as per patient request.  Since notification, patient had increased flatulence and has felt "much better".  MD aware.  Plan to increase mobility, thus enhancing bowel motility.

## 2018-05-01 NOTE — Progress Notes (Signed)
Gastroenterology Inpatient Follow-up Note   PATIENT IDENTIFICATION  Janice Ramos is a 77 y.o. female with a pmh significant for DM, HLD, IBS, who presented with hematochezia and diarrhea and now recently diagnosed with acute on chronic colitis consistent with IBD. Hospital Day: 10  SUBJECTIVE  By the time of late AM rounds, the patient was feeling improved from her abdominal discomfort from earlier in the AM. The patient feels that she is better than yesterday. I reviewed with her the leukocytosis. She denies fevers or chills. She feels that BRB is only slightly improved. She wants to continue to eat.   OBJECTIVE  Scheduled Inpatient Medications:  . calcium carbonate  1 tablet Oral BID  . feeding supplement (ENSURE ENLIVE)  237 mL Oral TID BM  . feeding supplement (PRO-STAT SUGAR FREE 64)  30 mL Oral Daily  . folic acid  1 mg Oral Daily  . hydrochlorothiazide  12.5 mg Oral Daily  . insulin aspart  0-9 Units Subcutaneous TID WC  . lidocaine  1 application Urethral Once  . lisinopril  10 mg Oral Daily  . methylPREDNISolone (SOLU-MEDROL) injection  40 mg Intravenous Q12H  . mirtazapine  30 mg Oral QHS  . multivitamin with minerals  1 tablet Oral Daily  . thiamine  100 mg Oral Daily  . valACYclovir  1,000 mg Oral TID   Continuous Inpatient Infusions:  . sodium chloride    . sodium chloride 75 mL/hr at 05/01/18 0724   PRN Inpatient Medications: sodium chloride, acetaminophen, hydroxypropyl methylcellulose / hypromellose, ondansetron (ZOFRAN) IV, simethicone   Physical Examination  Temp:  [97.8 F (36.6 C)-98.2 F (36.8 C)] 97.9 F (36.6 C) (08/24 0551) Pulse Rate:  [60-82] 82 (08/24 0551) Resp:  [18-20] 18 (08/24 0551) BP: (150-170)/(74-87) 170/80 (08/24 0559) SpO2:  [97 %-99 %] 97 % (08/24 0551) Temp (24hrs), Avg:98 F (36.7 C), Min:97.8 F (36.6 C), Max:98.2 F (36.8 C)  Weight: 59 kg GEN: Elderly, NAD, resting in bed PSYCH: Cooperative, without pressured  speech EYE: Conjunctivae pink, sclerae anicteric ENT: MMM CV: Without R/Gs  RESP: Decreased BS at the bases bilaterally GI: Hyperactive BS, soft, TTP in the midepigastrium upon deep palpation, protuberant, tympanic to percussion MSK/EXT: BLE edema NEURO:  Alert & Oriented x 3, no focal deficits   Review of Data   Laboratory Studies   Recent Labs  Lab 05/01/18 0441  NA 134*  K 4.5  CL 100  CO2 26  BUN 18  CREATININE 0.66  GLUCOSE 169*  CALCIUM 7.5*   Recent Labs  Lab 05/01/18 0905  AST 22  ALT 38  ALKPHOS 57    Recent Labs  Lab 04/29/18 0246 04/30/18 0542 05/01/18 0441  WBC 11.6* 14.4* 17.7*  HGB 8.7* 8.3* 8.1*  HCT 26.1* 24.6* 24.4*  PLT 488* 460* 534*   No results for input(s): APTT, INR in the last 168 hours.  Imaging Studies  Dg Abd 1 View  Result Date: 04/30/2018 CLINICAL DATA:  Upper abdominal pain and distention for the past 2 weeks. Recent colonoscopy 4 days ago. EXAM: ABDOMEN - 1 VIEW COMPARISON:  CT abdomen pelvis dated April 23, 2018. FINDINGS: Gas throughout a nondistended colon. No dilated loops of small bowel. No definite pneumoperitoneum, although evaluation is limited on the single supine view. No acute osseous abnormality. Mild degenerative changes of the lower lumbar spine. IMPRESSION: 1. No obstruction or ileus. Electronically Signed   By: Obie Dredge M.D.   On: 04/30/2018 10:22   Dg Abd  Acute W/chest  Result Date: 04/30/2018 CLINICAL DATA:  Encounter for acute abdominal pain and bloating EXAM: DG ABDOMEN ACUTE W/ 1V CHEST COMPARISON:  Abdomen radiographs 04/30/2018 at 09:59 hours FINDINGS: A frontal view of the chest demonstrates a top-normal size heart. Minimal aortic atherosclerosis is noted. Eventration or slight elevation of the left hemidiaphragm. Retrocardiac confluent opacity cannot exclude left lower lobe pneumonia or atelectasis. Small left effusion is suspected slightly obscuring the left lateral costophrenic angle. Diffuse small  and large bowel gaseous distention is seen without mechanical bowel obstruction identified. Gas is noted to the level of the rectum. No pneumoperitoneum. IMPRESSION: 1. Confluent retrocardiac opacity at the left lung base with blunting of the left costophrenic angle. Atelectasis and/or pneumonia are potential etiologies with small left effusion. 2. Nonspecific, nonobstructed bowel gas pattern with mild-to-moderate gaseous distention of small and large bowel loops to the level of the rectum. Findings are similar in appearance to prior. Electronically Signed   By: Tollie Ethavid  Kwon M.D.   On: 04/30/2018 15:39    ASSESSMENT  [name] is a 77 y.o. female  with a pmh significant for DM, HLD, IBS, who presented with hematochezia and diarrhea and now recently diagnosed with acute on chronic colitis consistent with IBD.  The patient is hemodynamically stable.  She has had a rise in her white count but overall feels that she has improved from yesterday to today.  She had a negative C. difficile check yesterday into today.  I think it is reasonable to monitor the patient closely today.  If at any time point she has progression in her abdominal pains or develops a fever or has significant subjective chills it would be reasonable to then pursue a cross-sectional CT abdomen with IV contrast to evaluate and ensure that no complications are noted from her IBD flare.  She has been on IV steroids for a few days and for now we will maintain that.  If tomorrow her leukocytosis progresses or worsens she should undergo a cross-sectional CT.  It is possible that her leukocytosis is a result of a leukemoid reaction from her steroids however we must be vigilant.  All questions were answered to the best of our ability.   PLAN/RECOMMENDATIONS  - Monitor patient closely with serial abdominal exams - If patient develops progressive abdominal pain and tenderness would proceed with a cross-sectional CT abdomen to rule out intra-abdominal  abscess/catastrophe - If leukocytosis progresses into tomorrow then obtain cross-sectional CT abdomen -Maintain Solu-Medrol 40 mg twice daily as previously ordered - Monitor calorie counts   I discussed the case with the patient's primary service.  Please page/call with questions or concerns.   Corliss ParishGabriel Mansouraty, MD [facility] Office # [phone]    LOS: 8 days  [facility] Services Pc[facility]riel Mansouraty Jr  05/01/2018, 11:41 AM

## 2018-05-02 ENCOUNTER — Inpatient Hospital Stay (HOSPITAL_COMMUNITY): Payer: Medicare Other

## 2018-05-02 DIAGNOSIS — D72829 Elevated white blood cell count, unspecified: Secondary | ICD-10-CM

## 2018-05-02 LAB — CBC
HCT: 26.7 % — ABNORMAL LOW (ref 36.0–46.0)
Hemoglobin: 8.9 g/dL — ABNORMAL LOW (ref 12.0–15.0)
MCH: 31.2 pg (ref 26.0–34.0)
MCHC: 33.3 g/dL (ref 30.0–36.0)
MCV: 93.7 fL (ref 78.0–100.0)
Platelets: 558 10*3/uL — ABNORMAL HIGH (ref 150–400)
RBC: 2.85 MIL/uL — AB (ref 3.87–5.11)
RDW: 15.2 % (ref 11.5–15.5)
WBC: 19.3 10*3/uL — ABNORMAL HIGH (ref 4.0–10.5)

## 2018-05-02 LAB — GLUCOSE, CAPILLARY
GLUCOSE-CAPILLARY: 198 mg/dL — AB (ref 70–99)
GLUCOSE-CAPILLARY: 130 mg/dL — AB (ref 70–99)
Glucose-Capillary: 162 mg/dL — ABNORMAL HIGH (ref 70–99)
Glucose-Capillary: 188 mg/dL — ABNORMAL HIGH (ref 70–99)

## 2018-05-02 MED ORDER — IOPAMIDOL (ISOVUE-300) INJECTION 61%: 15.0000 mL | INTRAVENOUS | Status: AC

## 2018-05-02 MED ORDER — IOPAMIDOL (ISOVUE-300) INJECTION 61%
INTRAVENOUS | Status: AC
  Administered 2018-05-02: 100 mL
  Filled 2018-05-02: qty 100

## 2018-05-02 MED ORDER — IOHEXOL 300 MG/ML  SOLN: 100.0000 mL | Freq: Once | INTRAMUSCULAR | Status: DC | PRN

## 2018-05-02 NOTE — Plan of Care (Signed)
  Problem: Education: Goal: Knowledge of General Education information will improve Description Including pain rating scale, medication(s)/side effects and non-pharmacologic comfort measures Outcome: Adequate for Discharge   Problem: Clinical Measurements: Goal: Ability to maintain clinical measurements within normal limits will improve Outcome: Progressing Goal: Will remain free from infection Outcome: Not Progressing Goal: Diagnostic test results will improve Outcome: Not Progressing Goal: Respiratory complications will improve Outcome: Progressing   Problem: Clinical Measurements: Goal: Will remain free from infection Outcome: Not Progressing   Problem: Clinical Measurements: Goal: Diagnostic test results will improve Outcome: Not Progressing   Problem: Clinical Measurements: Goal: Respiratory complications will improve Outcome: Progressing   Problem: Activity: Goal: Risk for activity intolerance will decrease Outcome: Progressing   Problem: Nutrition: Goal: Adequate nutrition will be maintained Outcome: Progressing   Problem: Coping: Goal: Level of anxiety will decrease Outcome: Progressing   Problem: Elimination: Goal: Will not experience complications related to bowel motility Outcome: Not Progressing Goal: Will not experience complications related to urinary retention Outcome: Progressing   Problem: Elimination: Goal: Will not experience complications related to urinary retention Outcome: Progressing   Problem: Pain Managment: Goal: General experience of comfort will improve Outcome: Progressing   Problem: Safety: Goal: Ability to remain free from injury will improve Outcome: Progressing

## 2018-05-02 NOTE — Progress Notes (Signed)
FOR DR Saralyn Pilar HUNG         Daily Rounding Note  05/02/2018, 10:03 AM  LOS: 9 days   SUBJECTIVE:   Chief complaint:     abd pain imporved, feels bloated but passing gas.  Stools less diarrheal, mucoid with some blood now.  Less blood and way decreased frequency and stool  Volume.  Tolerating PO, appetite improved, N/V resolved.  Documenting eating 80 to 100% of HH//CM diet.    OBJECTIVE:         Vital signs in last 24 hours:    Temp:  [97.9 F (36.6 C)-98.1 F (36.7 C)] 97.9 F (36.6 C) (08/25 0525) Pulse Rate:  [78-90] 78 (08/25 0525) Resp:  [20] 20 (08/24 1414) BP: (151-159)/(76-86) 159/86 (08/25 0525) SpO2:  [93 %-98 %] 97 % (08/25 0525) Last BM Date: 05/01/18 Filed Weights   04/23/18 0232  Weight: 59 kg   General: elderly, anxious somewhat chronically ill looking WF   Heart: slightly tachy, regular Chest: clear bil.  No SOB or cough Abdomen: soft, slight distention, NT.  Active BS  Extremities: no CCE Neuro/Psych:  Anxious but with conversation she calms down.  No gross deficits or tremors.    Intake/Output from previous day: 08/24 0701 - 08/25 0700 In: 2837.6 [P.O.:1068; I.V.:1769.6] Out: 1200 [Urine:1200]  Intake/Output this shift: Total I/O In: 358 [P.O.:358] Out: -   Lab Results: Recent Labs    04/30/18 0542 05/01/18 0441 05/02/18 0937  WBC 14.4* 17.7* 19.3*  HGB 8.3* 8.1* 8.9*  HCT 24.6* 24.4* 26.7*  PLT 460* 534* 558*   BMET Recent Labs    04/30/18 0542 05/01/18 0441  NA 136 134*  K 4.9 4.5  CL 107 100  CO2 25 26  GLUCOSE 157* 169*  BUN 20 18  CREATININE 0.73 0.66  CALCIUM 7.4* 7.5*   LFT Recent Labs    05/01/18 0905  PROT 5.0*  ALBUMIN 1.8*  AST 22  ALT 38  ALKPHOS 57  BILITOT 0.6  BILIDIR 0.1  IBILI 0.5   PT/INR No results for input(s): LABPROT, INR in the last 72 hours. Hepatitis Panel No results for input(s): HEPBSAG, HCVAB, HEPAIGM, HEPBIGM in the last 72  hours.  Studies/Results: Dg Abd 1 View  Result Date: 04/30/2018 CLINICAL DATA:  Upper abdominal pain and distention for the past 2 weeks. Recent colonoscopy 4 days ago. EXAM: ABDOMEN - 1 VIEW COMPARISON:  CT abdomen pelvis dated April 23, 2018. FINDINGS: Gas throughout a nondistended colon. No dilated loops of small bowel. No definite pneumoperitoneum, although evaluation is limited on the single supine view. No acute osseous abnormality. Mild degenerative changes of the lower lumbar spine. IMPRESSION: 1. No obstruction or ileus. Electronically Signed   By: Titus Dubin M.D.   On: 04/30/2018 10:22   Dg Abd Acute W/chest  Result Date: 04/30/2018 CLINICAL DATA:  Encounter for acute abdominal pain and bloating EXAM: DG ABDOMEN ACUTE W/ 1V CHEST COMPARISON:  Abdomen radiographs 04/30/2018 at 09:59 hours FINDINGS: A frontal view of the chest demonstrates a top-normal size heart. Minimal aortic atherosclerosis is noted. Eventration or slight elevation of the left hemidiaphragm. Retrocardiac confluent opacity cannot exclude left lower lobe pneumonia or atelectasis. Small left effusion is suspected slightly obscuring the left lateral costophrenic angle. Diffuse small and large bowel gaseous distention is seen without mechanical bowel obstruction identified. Gas is noted to the level of the rectum. No pneumoperitoneum. IMPRESSION: 1. Confluent retrocardiac opacity at the left lung base with blunting of the  left costophrenic angle. Atelectasis and/or pneumonia are potential etiologies with small left effusion. 2. Nonspecific, nonobstructed bowel gas pattern with mild-to-moderate gaseous distention of small and large bowel loops to the level of the rectum. Findings are similar in appearance to prior. Electronically Signed   By: Ashley Royalty M.D.   On: 04/30/2018 15:39   Scheduled Meds: . calcium carbonate  1 tablet Oral BID  . feeding supplement (ENSURE ENLIVE)  237 mL Oral TID BM  . feeding supplement  (PRO-STAT SUGAR FREE 64)  30 mL Oral Daily  . folic acid  1 mg Oral Daily  . hydrochlorothiazide  12.5 mg Oral Daily  . insulin aspart  0-9 Units Subcutaneous TID WC  . lidocaine  1 application Urethral Once  . lisinopril  10 mg Oral Daily  . methylPREDNISolone (SOLU-MEDROL) injection  40 mg Intravenous Q12H  . mirtazapine  30 mg Oral QHS  . multivitamin with minerals  1 tablet Oral Daily  . thiamine  100 mg Oral Daily  . valACYclovir  1,000 mg Oral TID   Continuous Infusions: . sodium chloride     PRN Meds:.sodium chloride, acetaminophen, hydroxypropyl methylcellulose / hypromellose, ondansetron (ZOFRAN) IV, simethicone   ASSESMENT:   *   Left sided colitis with hematochezia, sxs present for several months.  Severe disease per Colonoscopy 8/19.  Pathology c/w IBD, favoring UC.   Day 7 Solumedrol.  Lialda discontinued for ? It causing adverse GI s/e.   c diff, stool  Path panel negative.  Elevated ESR, CRP.   Pt improving.    *   Anemia.  Hgb stable to slightly improved.    *  Rising WBCs, ? Secondary to the steroids?    *  Protein malnutrition.  Albumin is 1.8  *  Bipolar d/o anxiety.     PLAN   *  Pre albumin in AM.  Continue steroids.  Dr Benson Norway will return to follow pt tomorrow.      Azucena Freed  05/02/2018, 10:03 AM Phone [phone]

## 2018-05-02 NOTE — Progress Notes (Signed)
Family Medicine Teaching Service Daily Progress Note Intern Pager: [phone]  Patient name: Janice Ramos Medical record number: [id number] Date of birth: Jul 23, 1941 Age: 77 y.o. Gender: female  Primary Care Provider: Joana ReamerMullis, Kiersten P, DO Consultants: GI, ophthalmology Code Status: DNR  Pt Overview and Major Events to Date:  8/16- admitted for worsening vomiting and diarrhea 8/19- colonoscopy 8/21- diagnosed ulcerative colitis 8/22- restart of BRBPR 8/23- seen by ophthalmology, valtrex increased to treatment dose  Assessment and Plan: Janice Ramos a 77 y.o.femalepresenting with colitis. 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  Inflammatory colitis with hematochezia  -  Continues to have mild abdominal pain and distension today, much improved since last exam. Continued loose stools much improved since yesterday. 8/19 colonoscopy c/w UC, exacerbation thought to be due to 5ASA medication, mesalamine. Cdiff 8/23 negative.Abd XR from 8/23 with diffuse small and large bowel gaseous distension without mechanical obstruction identified. Acute abdomen films showed confluent retrocardiac opacity at L lung base, atelectasis vs pna, nonobstructive bowel gas pattern, mild to moderate gaseous distension. Patient is clinically much improved today. Repeated CBC today to trend WBC which had been climbing possibly due to steroid use contributing. Will hold off on CT abdomen today given improvement. Patient has good oral intake and Cr 0.66- will d/c 3275mL/hr fluids today. - continue solumedrol until stools are more formed - will follow up GI recs -ordered incentive spirometer for atelectasis/congestion seen on xray, ordered PT, also recommended patient to try to get up and walk/ eat meals in chair as tolerated today  Acute Anemia - Hgb 8.1 yesterday, trending down over several days. Ordered  another CBC today which is pending. No symptoms of anemia. Likely due to rectal losses in setting of colitis exacerbation. Transfusion threshold 7.0. Hold heparin/enoxaparin. Monitor for hemodynamic instability.  - monitor CBC  Urinary Retention - thought to be due to anticholinergic medications. Foley placed 8/21 early AM. Urology recommended 3 days with foley. Foley was DC yesterday morning with voiding trial normal. Patient is having no continued dysuria.  - continue to monitor I/O  Fall, mechanical, while admitted (8/19). No concerning physical exam findings after the fall. See separate progress note from that date. Consulted PT to see patient today -patient on video monitoring -follow up PT recs  HSV-2keratitis  - ophthalmology consulted d/t complaints of ocular irritation while inpatient. No complaints of eye irritation today  - treatment dose valtrex 1g TID - artificial tears QID - [facility] to assess any residual AC inflammation - call ophtho if patient worsens  Essential HTN-BP159/86 today and stable since restarted home meds. Home HCTZ 12.5 mg daily, Lisinopril 10 mg PO daily at home. BP increase could be contributed to by patient's pain and stress from admission. Consider increasing dose- K+ 4.5 yesterday - continue home HCTZ and lisinopril   Major depression and bipolar disorder- Stable. Home med list includingmethylphenidate, mirtazapine, clonazepam, venlafaxine and zolpidem. Clarified home regimen with outpatient pharmacy shortly after admission.  - continue home mirtazapine - monitor  Hyperglycemia patient has no history of diabetes. Blood sugar likely in setting of steroid use this admission. - CBG 162-189, received 2u SSI yesterday - sliding scale insulin -carb modified diet  FEN/GI: full diet carb modified, 475mL/hr NS d/c today for good oral intake and BP stable PPx: SCD. No heparin due to BRBPR  Disposition: Patient  requires  inpatient status due to continued management of BRBPR and anemia. Anticipate at least 2-3 more inpatient days.  Subjective:  No acute events overnight. Patient continues to feel bloated and uncomfortable this morning. She states that she feels slightly worse than yesterday evening. No point abdominal tenderness, but she does endorse generalized abdominal discomfort. Last BM was overnight, and it was charted as bloody diarrhea. She is not having nausea.   Objective: Temp:  [97.9 F (36.6 C)-98.1 F (36.7 C)] 97.9 F (36.6 C) (08/25 0525) Pulse Rate:  [78-90] 78 (08/25 0525) Resp:  [20] 20 (08/24 1414) BP: (151-159)/(76-86) 159/86 (08/25 0525) SpO2:  [93 %-98 %] 97 % (08/25 0525) Physical Exam: General: NAD, mildly uncomfortable but nontoxic, speaking in full sentences and pleasant Cardiovascular: RRR, no m/r/g Respiratory: CTA bil, no W/R/R Abdomen: +bowel sounds, soft, mildly distended, mildly tender to light palpation diffusely. No HSM.  Extremities: 1+ edema LE bilaterally  Laboratory: Recent Labs  Lab 04/29/18 0246 04/30/18 0542 05/01/18 0441  WBC 11.6* 14.4* 17.7*  HGB 8.7* 8.3* 8.1*  HCT 26.1* 24.6* 24.4*  PLT 488* 460* 534*   Recent Labs  Lab 04/29/18 0246 04/30/18 0542 05/01/18 0441 05/01/18 0905  NA 140 136 134*  --   K 4.3 4.9 4.5  --   CL 117* 107 100  --   CO2 20* 25 26  --   BUN 28* 20 18  --   CREATININE 0.91 0.73 0.66  --   CALCIUM 7.2* 7.4* 7.5*  --   PROT  --   --   --  5.0*  BILITOT  --   --   --  0.6  ALKPHOS  --   --   --  57  ALT  --   --   --  38  AST  --   --   --  22  GLUCOSE 188* 157* 169*  --    Imaging/Diagnostic Tests: Dg Abd 1 View Result Date: 04/30/2018  IMPRESSION: 1. No obstruction or ileus.  Dg Abd Acute W/chest Result Date: 04/30/2018 IMPRESSION:  1. Confluent retrocardiac opacity at the left lung base with blunting of the left costophrenic angle. Atelectasis and/or pneumonia are potential etiologies with small left  effusion.  2. Nonspecific, nonobstructed bowel gas pattern with mild-to-moderate gaseous distention of small and large bowel loops to the level of the rectum. Findings are similar in appearance to prior.   Leeroy Bock, DO 05/02/2018, 7:46 AM PGY-3, [facility] FPTS Intern pager: [phone], text pages welcome

## 2018-05-03 LAB — PREALBUMIN: PREALBUMIN: 26.8 mg/dL (ref 18–38)

## 2018-05-03 LAB — CBC
HCT: 23.3 % — ABNORMAL LOW (ref 36.0–46.0)
Hemoglobin: 7.8 g/dL — ABNORMAL LOW (ref 12.0–15.0)
MCH: 31.8 pg (ref 26.0–34.0)
MCHC: 33.5 g/dL (ref 30.0–36.0)
MCV: 95.1 fL (ref 78.0–100.0)
PLATELETS: 521 10*3/uL — AB (ref 150–400)
RBC: 2.45 MIL/uL — AB (ref 3.87–5.11)
RDW: 15.6 % — ABNORMAL HIGH (ref 11.5–15.5)
WBC: 18.8 10*3/uL — ABNORMAL HIGH (ref 4.0–10.5)

## 2018-05-03 LAB — GLUCOSE, CAPILLARY
GLUCOSE-CAPILLARY: 233 mg/dL — AB (ref 70–99)
GLUCOSE-CAPILLARY: 307 mg/dL — AB (ref 70–99)
Glucose-Capillary: 157 mg/dL — ABNORMAL HIGH (ref 70–99)
Glucose-Capillary: 188 mg/dL — ABNORMAL HIGH (ref 70–99)

## 2018-05-03 LAB — COMPREHENSIVE METABOLIC PANEL
ALT: 46 U/L — AB (ref 0–44)
AST: 31 U/L (ref 15–41)
Albumin: 1.9 g/dL — ABNORMAL LOW (ref 3.5–5.0)
Alkaline Phosphatase: 56 U/L (ref 38–126)
Anion gap: 5 (ref 5–15)
BILIRUBIN TOTAL: 0.4 mg/dL (ref 0.3–1.2)
BUN: 21 mg/dL (ref 8–23)
CALCIUM: 7.4 mg/dL — AB (ref 8.9–10.3)
CHLORIDE: 100 mmol/L (ref 98–111)
CO2: 27 mmol/L (ref 22–32)
CREATININE: 0.8 mg/dL (ref 0.44–1.00)
Glucose, Bld: 214 mg/dL — ABNORMAL HIGH (ref 70–99)
Potassium: 4.2 mmol/L (ref 3.5–5.1)
Sodium: 132 mmol/L — ABNORMAL LOW (ref 135–145)
TOTAL PROTEIN: 5.1 g/dL — AB (ref 6.5–8.1)

## 2018-05-03 MED ORDER — HYDROCHLOROTHIAZIDE 25 MG PO TABS
25.0000 mg | ORAL_TABLET | Freq: Every day | ORAL | Status: DC
  Administered 2018-05-04 – 2018-05-10 (×7): 25 mg via ORAL
  Filled 2018-05-03 (×7): qty 1

## 2018-05-03 MED ORDER — DOCUSATE SODIUM 100 MG PO CAPS
100.0000 mg | ORAL_CAPSULE | Freq: Two times a day (BID) | ORAL | Status: DC
  Administered 2018-05-03 – 2018-05-06 (×6): 100 mg via ORAL
  Filled 2018-05-03 (×6): qty 1

## 2018-05-03 MED ORDER — POLYETHYLENE GLYCOL 3350 17 G PO PACK
17.0000 g | PACK | Freq: Two times a day (BID) | ORAL | Status: DC
  Administered 2018-05-03 – 2018-05-09 (×8): 17 g via ORAL
  Filled 2018-05-03 (×14): qty 1

## 2018-05-03 MED ORDER — POLYETHYLENE GLYCOL 3350 17 G PO PACK: 17.0000 g | PACK | Freq: Every day | ORAL | Status: DC

## 2018-05-03 MED ORDER — PREDNISONE 20 MG PO TABS
40.0000 mg | ORAL_TABLET | Freq: Every day | ORAL | Status: DC
  Administered 2018-05-04 – 2018-05-10 (×7): 40 mg via ORAL
  Filled 2018-05-03 (×7): qty 2

## 2018-05-03 NOTE — Progress Notes (Signed)
Physical Therapy Treatment Patient Details Name: Janice Ramos MRN: [id number] DOB: 12-03-1940 Today's Date: 05/03/2018    History of Present Illness 77 y.o. female presenting with gradual onset nausea and vomiting in setting of Inflammatory colitis with hematochezia    PT Comments    Patient received up in chair, pleasant and willing to participate in skilled PT session but reporting high levels of fatigue this morning. Able to ambulate to sink and wash hands without UE support and min guard for balance training, otherwise able to progress gait considerably and ambulated approximately 14f in hallway with RW and heavy use of UEs on assistive device this morning. Gait limited by fatigue and cramping in LE musculature. She was left up in her chair with all needs met, chair alarm activated this morning.     Follow Up Recommendations  Supervision/Assistance - 24 hour;SNF     Equipment Recommendations  Rolling walker with 5" wheels    Recommendations for Other Services       Precautions / Restrictions Precautions Precautions: Fall Restrictions Weight Bearing Restrictions: No    Mobility  Bed Mobility               General bed mobility comments: DNT, received up in chair   Transfers Overall transfer level: Needs assistance Equipment used: Rolling walker (2 wheeled) Transfers: Sit to/from Stand Sit to Stand: Min guard         General transfer comment: min guard and extended time to power up to full standing   Ambulation/Gait Ambulation/Gait assistance: Min guard Gait Distance (Feet): 120 Feet Assistive device: Rolling walker (2 wheeled) Gait Pattern/deviations: Step-through pattern;Decreased stride length;Shuffle;Trunk flexed     General Gait Details: continues to fatigue easily but very motivated this morning, able to progress gait distance considerably with min guard for safety, cues to reduce UE reliance on walker    Stairs             Wheelchair  Mobility    Modified Rankin (Stroke Patients Only)       Balance Overall balance assessment: History of Falls;Needs assistance Sitting-balance support: No upper extremity supported;Feet supported Sitting balance-Leahy Scale: Good     Standing balance support: No upper extremity supported;During functional activity Standing balance-Leahy Scale: Fair                              Cognition Arousal/Alertness: Awake/alert Behavior During Therapy: WFL for tasks assessed/performed Overall Cognitive Status: No family/caregiver present to determine baseline cognitive functioning                                        Exercises      General Comments        Pertinent Vitals/Pain Pain Assessment: 0-10 Pain Score: 1  Pain Location: stomach pain and cramping in legs Pain Descriptors / Indicators: Aching;Discomfort;Grimacing;Guarding Pain Intervention(s): Limited activity within patient's tolerance;Monitored during session    Home Living                      Prior Function            PT Goals (current goals can now be found in the care plan section) Acute Rehab PT Goals Patient Stated Goal: to get better PT Goal Formulation: With patient Time For Goal Achievement: 05/12/18 Potential to Achieve Goals: Good Progress  towards PT goals: Progressing toward goals    Frequency    Min 2X/week      PT Plan Discharge plan needs to be updated    Co-evaluation              AM-PAC PT "6 Clicks" Daily Activity  Outcome Measure  Difficulty turning over in bed (including adjusting bedclothes, sheets and blankets)?: A Little Difficulty moving from lying on back to sitting on the side of the bed? : A Little Difficulty sitting down on and standing up from a chair with arms (e.g., wheelchair, bedside commode, etc,.)?: A Little Help needed moving to and from a bed to chair (including a wheelchair)?: A Little Help needed walking in hospital  room?: A Little Help needed climbing 3-5 steps with a railing? : A Lot 6 Click Score: 17    End of Session   Activity Tolerance: Patient limited by fatigue Patient left: in chair;with chair alarm set;with call bell/phone within reach   PT Visit Diagnosis: Unsteadiness on feet (R26.81);Difficulty in walking, not elsewhere classified (R26.2);Muscle weakness (generalized) (M62.81)     Time: 3354-5625 PT Time Calculation (min) (ACUTE ONLY): 11 min  Charges:  $Gait Training: 8-22 mins                     Deniece Ree PT, DPT, CBIS  Supplemental Physical Therapist [facility]   Pager [phone]

## 2018-05-03 NOTE — Progress Notes (Signed)
Subjective: No complaints.  Feeling much better.  She denies any issues with diarrhea.  Objective: Vital signs in last 24 hours: Temp:  [98.1 F (36.7 C)-98.7 F (37.1 C)] 98.1 F (36.7 C) (08/26 0508) Pulse Rate:  [84-103] 84 (08/26 0508) Resp:  [20] 20 (08/25 1444) BP: (143-158)/(69-80) 150/80 (08/26 0508) SpO2:  [94 %-99 %] 94 % (08/26 0508) Last BM Date: 05/02/18  Intake/Output from previous day: 08/25 0701 - 08/26 0700 In: 598 [P.O.:598] Out: 2100 [Urine:2100] Intake/Output this shift: No intake/output data recorded.  General appearance: alert and no distress GI: soft, non-tender; bowel sounds normal; no masses,  no organomegaly  Lab Results: Recent Labs    05/01/18 0441 05/02/18 0937 05/03/18 0557  WBC 17.7* 19.3* 18.8*  HGB 8.1* 8.9* 7.8*  HCT 24.4* 26.7* 23.3*  PLT 534* 558* 521*   BMET Recent Labs    05/01/18 0441 05/03/18 0557  NA 134* 132*  K 4.5 4.2  CL 100 100  CO2 26 27  GLUCOSE 169* 214*  BUN 18 21  CREATININE 0.66 0.80  CALCIUM 7.5* 7.4*   LFT Recent Labs    05/01/18 0905 05/03/18 0557  PROT 5.0* 5.1*  ALBUMIN 1.8* 1.9*  AST 22 31  ALT 38 46*  ALKPHOS 57 56  BILITOT 0.6 0.4  BILIDIR 0.1  --   IBILI 0.5  --    PT/INR No results for input(s): LABPROT, INR in the last 72 hours. Hepatitis Panel No results for input(s): HEPBSAG, HCVAB, HEPAIGM, HEPBIGM in the last 72 hours. C-Diff Recent Labs    04/30/18 2234  CDIFFTOX NEGATIVE   Fecal Lactopherrin No results for input(s): FECLLACTOFRN in the last 72 hours.  Studies/Results: Ct Abdomen Pelvis W Contrast  Result Date: 05/02/2018 CLINICAL DATA:  77 year old female with abdominal pain and leukocytosis. New diagnosis of inflammatory bowel disease. EXAM: CT ABDOMEN AND PELVIS WITH CONTRAST TECHNIQUE: Multidetector CT imaging of the abdomen and pelvis was performed using the standard protocol following bolus administration of intravenous contrast. CONTRAST:  100mL ISOVUE-300  IOPAMIDOL (ISOVUE-300) INJECTION 61% COMPARISON:  Abdominal radiograph dated 04/30/2018 and CT dated 04/23/2018 FINDINGS: Lower chest: Partially visualized small bilateral pleural effusions, left greater than right with associated partial compressive atelectasis of the adjacent lung bases. There is no intra-abdominal free air or free fluid. Hepatobiliary: Multiple small scattered hepatic hypodense lesions are not well characterized but most likely represent cysts. The liver is otherwise unremarkable. No intrahepatic biliary ductal dilatation. The gallbladder is unremarkable. Pancreas: Unremarkable. No pancreatic ductal dilatation or surrounding inflammatory changes. Spleen: Normal in size without focal abnormality. Adrenals/Urinary Tract: The adrenal glands are unremarkable. The kidneys and visualized ureters appear unremarkable as well. There is symmetric enhancement and excretion of contrast by both kidneys. The urinary bladder is distended. Small amount of air within the urinary bladder likely related to recent instrumentation. Clinical correlation is recommended. Stomach/Bowel: There is segmental thickening of the distal descending colon and rectosigmoid with interval improvement compared to the prior CT of 04/23/2018 most consistent with improving colitis. An infiltrative process is less likely. Clinical correlation is recommended. There is loose stool in the distal colon compatible with diarrheal state. Formed stool noted in the proximal colon. There is no evidence of bowel obstruction. There is mild inflammatory changes of the second portion of the duodenum which is new since the prior CT. The appendix is normal. Vascular/Lymphatic: Mild aortoiliac atherosclerotic disease. The SMV, splenic vein, and main portal vein are patent. No portal venous gas. There is no adenopathy.  A 5 mm right perirectal lymph node (series 3, image 70), likely reactive. Reproductive: The uterus is anteverted. There is thickened  appearance of the endometrium for the patient's age. Correlation with ultrasound is recommended for better evaluation of the endometrium. No adnexal mass. Other: There is diffuse subcutaneous edema and anasarca. Musculoskeletal: No acute or significant osseous findings. IMPRESSION: 1. Overall improvement of the colitis since the prior CT. Clinical correlation and follow-up as clinically indicated. There is however interval development of mild inflammatory changes of the duodenum since the prior CT. 2. Interval development of subcutaneous edema as well as bilateral pleural effusions. 3. Thickened endometrium. Further evaluation with pelvic ultrasound recommended. 4.  Aortic Atherosclerosis (ICD10-I70.0). Electronically Signed   By: Elgie Collard M.D.   On: 05/02/2018 21:17    Medications:  Scheduled: . calcium carbonate  1 tablet Oral BID  . feeding supplement (ENSURE ENLIVE)  237 mL Oral TID BM  . feeding supplement (PRO-STAT SUGAR FREE 64)  30 mL Oral Daily  . folic acid  1 mg Oral Daily  . hydrochlorothiazide  12.5 mg Oral Daily  . insulin aspart  0-9 Units Subcutaneous TID WC  . lidocaine  1 application Urethral Once  . lisinopril  10 mg Oral Daily  . methylPREDNISolone (SOLU-MEDROL) injection  40 mg Intravenous Q12H  . mirtazapine  30 mg Oral QHS  . multivitamin with minerals  1 tablet Oral Daily  . thiamine  100 mg Oral Daily  . valACYclovir  1,000 mg Oral TID   Continuous: . sodium chloride      Assessment/Plan: 1) Left sided Ulcerative Colitis. 2) Anemia. 3) Leukocytosis.   The patient is much better.  The CT scan does show an improvement in her colitis.  She still has a leukocytosis, but there is no documented fever and her diarrhea has abated.  The exacerbation of her symptoms were from [place].  Plan: 1) Change from Solumedrol to oral steroids.  40 mg QD.   2) She will need to be on a biologic medication, but this will be started as an outpatient. 3) Ideally the patient  should follow up in the office in 1 week, but she reports that she needs rehab.  Her steroid taper regimen should be as follows:  40 mg x 1 week, 30 mg x 2 weeks, 20 mg x 2 weeks, 15 mg x 2 weeks, 10 mg x 2 weeks, and then 5 mg x 2 weeks.  LOS: 10 days   Miria Cappelli D 05/03/2018, 7:29 AM

## 2018-05-03 NOTE — Progress Notes (Signed)
Family Medicine Teaching Service Daily Progress Note Intern Pager: [phone]  Patient name: Janice Ramos Medical record number: [id number] Date of birth: [birth year] Age: 77 y.o. Gender: female  Primary Care Provider: Joana Reamer, DO Consultants: GI, ophthalmology Code Status: DNR  Pt Overview and Major Events to Date:  8/16- admitted for worsening vomiting and diarrhea 8/19- colonoscopy 8/21- diagnosed ulcerative colitis 8/22- restart of BRBPR 8/23- seen by ophthalmology, valtrex increased to treatment dose  Assessment and Plan: Janice Ramos a 77 y.o.femalepresenting with colitis. 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  Inflammatory colitis with hematochezia 2/2 Ulcerative colitis   Day 8 of solumedrol. WBC now downtrending, elevation likely related to steroids as no other infectious symptoms. Diarrhea and abdominal pain improving. CT Abd yesterday with improved colitis as compared to previous on 8/16. One unmeasured stool in the last 24 hours. GI following and recommended steroid taper, can likely transition to PO today as she is tolerating a diet. - continues on solumedrol, can likely transition to PO today. - GI following, appreciate recs - strict I&Os - f/u fecal calprotectin  Acute Anemia -  Hb 7.8, down from 8.9 yesterday. Still with some mucous and blood with bowel movements though decreased from prior. Likely related to acute GI blood loss from colitis. Transfusion threshold 7.0. Hold heparin/enoxaparin. Monitor for hemodynamic instability.  - continue to monitor, will check am CBC  Urinary Retention - thought to be due to anticholinergic medications. S/p 3 day Foley placement 8/21-8/24 with successful voiding trial.  No complaints today. - continue to monitor I/O  Fall, mechanical, while admitted (8/19). No concerning physical exam findings after the  fall. See separate progress note from that date. Continues to endorse weakness, she thinks she would benefit from continued rehab. PT evaluated and recommended SNF.  - fall precautions -follow up PT recs - SNF  HSV-2keratitis  - ophthalmology consulted d/t complaints of ocular irritation while inpatient. No complaints of eye irritation today  - treatment dose valtrex 1g TID per optho - artificial tears QID - follow up with [facility] immediately after DC to assess any residual AC inflammation - call ophtho if patient worsens  Essential HTN-continues to be slightly hypertensive, BP 156/83 this am. On home lisinopril 10 mg daily, currently on decreased dose of HCTZ (takes 25mg  at home).  - continue lisinopril  - will plan to increase HCTZ back to home dose.  Major depression and bipolar disorder- Stable. Improved mood this am due to getting full night's sleep last night. Home med list includingmethylphenidate, mirtazapine, clonazepam, venlafaxine and zolpidem. Clarified home regimen with outpatient pharmacy shortly after admission.  - continue home mirtazapine - monitor  Hyperglycemia patient has no history of diabetes. Blood sugar likely in setting of steroid use this admission. CBG 233 this am. Received 6u aspart in the last 24 hours. - sliding scale insulin - carb modified diet  FEN/GI: full diet carb modified PPx: SCD. No heparin due to BRBPR  Disposition: continue inpatient management of inflammatory colitis  Subjective:  Patient feeling much better this am as she got a full night's sleep last night. Diarrhea and pain improved.  Objective: Temp:  [98.1 F (36.7 C)-98.7 F (37.1 C)] 98.1 F (36.7 C) (08/26 0508) Pulse Rate:  [84-103] 84 (08/26 0508) Resp:  [20] 20 (08/25 1444) BP: (143-158)/(69-83) 156/83 (08/26 0846) SpO2:  [94 %-99 %] 94 % (08/26  7829) Physical Exam: General: NAD, elderly female, pleasant. Cardiovascular: RRR, no murmur Respiratory: CTAB, no  wheezes/rales Abdomen: soft, slightly distended, not worsened from previous. TTP in all quadrants. +BS.  Extremities: trace LE edema bilaterally  Laboratory: Recent Labs  Lab 05/01/18 0441 05/02/18 0937 05/03/18 0557  WBC 17.7* 19.3* 18.8*  HGB 8.1* 8.9* 7.8*  HCT 24.4* 26.7* 23.3*  PLT 534* 558* 521*   Recent Labs  Lab 04/30/18 0542 05/01/18 0441 05/01/18 0905 05/03/18 0557  NA 136 134*  --  132*  K 4.9 4.5  --  4.2  CL 107 100  --  100  CO2 25 26  --  27  BUN 20 18  --  21  CREATININE 0.73 0.66  --  0.80  CALCIUM 7.4* 7.5*  --  7.4*  PROT  --   --  5.0* 5.1*  BILITOT  --   --  0.6 0.4  ALKPHOS  --   --  57 56  ALT  --   --  38 46*  AST  --   --  22 31  GLUCOSE 157* 169*  --  214*   Imaging/Diagnostic Tests: Ct Abdomen Pelvis W Contrast  Result Date: 05/02/2018 CLINICAL DATA:  77 year old female with abdominal pain and leukocytosis. New diagnosis of inflammatory bowel disease. EXAM: CT ABDOMEN AND PELVIS WITH CONTRAST TECHNIQUE: Multidetector CT imaging of the abdomen and pelvis was performed using the standard protocol following bolus administration of intravenous contrast. CONTRAST:  ISOVUE-300 IOPAMIDOL (ISOVUE-300) INJECTION 61% COMPARISON:  Abdominal radiograph dated 04/30/2018 and CT dated 04/23/2018 FINDINGS: Lower chest: Partially visualized small bilateral pleural effusions, left greater than right with associated partial compressive atelectasis of the adjacent lung bases. There is no intra-abdominal free air or free fluid. Hepatobiliary: Multiple small scattered hepatic hypodense lesions are not well characterized but most likely represent cysts. The liver is otherwise unremarkable. No intrahepatic biliary ductal dilatation. The gallbladder is unremarkable. Pancreas: Unremarkable. No pancreatic ductal dilatation or surrounding inflammatory changes. Spleen: Normal in size without focal abnormality. Adrenals/Urinary Tract: The adrenal glands are unremarkable. The  kidneys and visualized ureters appear unremarkable as well. There is symmetric enhancement and excretion of contrast by both kidneys. The urinary bladder is distended. Small amount of air within the urinary bladder likely related to recent instrumentation. Clinical correlation is recommended. Stomach/Bowel: There is segmental thickening of the distal descending colon and rectosigmoid with interval improvement compared to the prior CT of 04/23/2018 most consistent with improving colitis. An infiltrative process is less likely. Clinical correlation is recommended. There is loose stool in the distal colon compatible with diarrheal state. Formed stool noted in the proximal colon. There is no evidence of bowel obstruction. There is mild inflammatory changes of the second portion of the duodenum which is new since the prior CT. The appendix is normal. Vascular/Lymphatic: Mild aortoiliac atherosclerotic disease. The SMV, splenic vein, and main portal vein are patent. No portal venous gas. There is no adenopathy. A 5 mm right perirectal lymph node (series 3, image 70), likely reactive. Reproductive: The uterus is anteverted. There is thickened appearance of the endometrium for the patient's age. Correlation with ultrasound is recommended for better evaluation of the endometrium. No adnexal mass. Other: There is diffuse subcutaneous edema and anasarca. Musculoskeletal: No acute or significant osseous findings. IMPRESSION: 1. Overall improvement of the colitis since the prior CT. Clinical correlation and follow-up as clinically indicated. There is however interval development of mild inflammatory changes of the duodenum since the prior  CT. 2. Interval development of subcutaneous edema as well as bilateral pleural effusions. 3. Thickened endometrium. Further evaluation with pelvic ultrasound recommended. 4.  Aortic Atherosclerosis (ICD10-I70.0). Electronically Signed   By: Elgie CollardArash  Radparvar M.D.   On: 05/02/2018 21:17    Ellwood DenseRumball, Alison, DO 05/03/2018, 9:42 AM PGY-2, [facility] FPTS Intern pager: [phone], text pages welcome

## 2018-05-03 NOTE — [place] FL2 (Signed)
[place] MEDICAID FL2 LEVEL OF CARE SCREENING TOOL     IDENTIFICATION  Patient Name: Janice Ramos Birthdate: 04-06-1941 Sex: female Admission Date (Current Location): 04/22/2018  [facility] [place]   Facility and Address:  [facility] [place], [address], [place], [place] [address]      Provider Number: [id number]  Attending Physician Name and Address:  Westley ChandlerBrown, Carina M, MD  Relative Name and Phone Number:  Luther ParodyCaitlin, daughter, [phone]    Current Level of Care: Hospital Recommended Level of Care: Skilled Nursing Facility Prior Approval Number:    Date Approved/Denied:   PASRR Number: [id number]  Discharge Plan: SNF    Current Diagnoses: Patient Active Problem List   Diagnosis Date Noted  . Gaseous abdominal distention   . HSV (herpes simplex virus) dendritic keratitis   . Hematochezia   . Abnormal CT scan, colon   . Other ulcerative colitis with rectal bleeding (HCC) 04/23/2018  . Rectal bleeding 04/15/2018  . Benzodiazepine overdose, intentional self-harm, initial encounter (HCC) 01/22/2018  . Intentional drug overdose (HCC)   . MDD (major depressive disorder), recurrent severe, without psychosis (HCC)   . Alcohol abuse with alcohol-induced mood disorder (HCC) 08/20/2017  . Eye pain 03/26/2017  . 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 Ingrown toenail 08/16/2015  . Seborrheic dermatitis of scalp 01/30/2015  . Loss of appetite 01/30/2015  . Hair loss 10/16/2014  . Lumbar pain 06/29/2014  . Trochanteric bursitis of right hip 06/21/2014  . Greater trochanteric bursitis of left hip 05/31/2014  . Healthcare maintenance 05/31/2014  . Axillary mass 05/31/2014  . 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 Right hip pain 10/10/2013  . Piriformis syndrome of right side 06/10/2013  . Syncopal episodes 04/06/2012  . Headache 12/10/2011  . Wrist pain, chronic 11/17/2011  . Thumb pain 08/06/2011  . Trochanteric bursitis  06/22/2011  . 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 Fatigue 03/26/2011  . Weight loss 03/26/2011  . Urinary incontinence 11/12/2010  . GENITAL HERPES 08/05/2010  . INSOMNIA 01/26/2009  . Pre-diabetes 12/15/2008  . DEPRESSION, MAJOR 05/17/2008  . ESSENTIAL HYPERTENSION, BENIGN 05/17/2008  . HYPERCHOLESTEROLEMIA 11/05/2006  . BIPOLAR DISORDER 11/05/2006    Orientation RESPIRATION BLADDER Height & Weight     Self, Time, Situation, Place  Normal Continent Weight: 59 kg Height:  5\' 2"  (157.5 cm)  BEHAVIORAL SYMPTOMS/MOOD NEUROLOGICAL BOWEL NUTRITION STATUS      Continent Diet(Please see DC Summary)  AMBULATORY STATUS COMMUNICATION OF NEEDS Skin   Limited Assist Verbally Normal                       Personal Care Assistance Level of Assistance  Bathing, Feeding, Dressing Bathing Assistance: Limited assistance Feeding assistance: Independent Dressing Assistance: Limited assistance     Functional Limitations Info  Sight, Hearing, Speech Sight Info: Impaired Hearing Info: Adequate Speech Info: Adequate    SPECIAL CARE FACTORS FREQUENCY  PT (By licensed PT), OT (By licensed OT)     PT Frequency: 5x/week OT Frequency: 3x/week            Contractures Contractures Info: Not present    Additional Factors Info  Code Status, Allergies, Psychotropic, Insulin Sliding Scale Code Status Info: DNR Allergies Info: Latex Psychotropic Info: Remeron Insulin Sliding Scale Info: 3x daily with meals       Current Medications (05/03/2018):  This  is the current hospital active medication list Current Facility-Administered Medications  Medication Dose Route Frequency Provider Last Rate Last Dose  . 0.9 %  sodium chloride infusion   Intravenous PRN Jeani Hawking, MD      . acetaminophen (TYLENOL) tablet 650 mg  650 mg Oral Q6H PRN Jeani Hawking, MD   650 mg at 04/30/18  0951  . calcium carbonate (TUMS - dosed in mg elemental calcium) chewable tablet 200 mg of elemental calcium  1 tablet Oral BID Anderson, Chelsey L, DO   200 mg of elemental calcium at 05/01/18 1254  . feeding supplement (ENSURE ENLIVE) (ENSURE ENLIVE) liquid 237 mL  237 mL Oral TID BM Westley Chandler, MD   237 mL at 05/03/18 0853  . feeding supplement (PRO-STAT SUGAR FREE 64) liquid 30 mL  30 mL Oral Daily Jeani Hawking, MD   30 mL at 04/24/18 1034  . folic acid (FOLVITE) tablet 1 mg  1 mg Oral Daily Jeani Hawking, MD   1 mg at 05/03/18 0848  . [START ON 05/04/2018] hydrochlorothiazide (HYDRODIURIL) tablet 25 mg  25 mg Oral Daily Rumball, Alison, DO      . hydroxypropyl methylcellulose / hypromellose (ISOPTO TEARS / GONIOVISC) 2.5 % ophthalmic solution 1 drop  1 drop Both Eyes QID PRN Anderson, Chelsey L, DO   1 drop at 04/28/18 1058  . insulin aspart (novoLOG) injection 0-9 Units  0-9 Units Subcutaneous TID WC Ellwood Dense, DO   7 Units at 05/03/18 1237  . iohexol (OMNIPAQUE) 300 MG/ML solution 100 mL  100 mL Intravenous Once PRN Mansouraty, Netty Starring., MD      . lidocaine (XYLOCAINE) 2 % jelly 1 application  1 application Urethral Once Meccariello, Solmon Ice, DO      . lisinopril (PRINIVIL,ZESTRIL) tablet 10 mg  10 mg Oral Daily Anderson, Chelsey L, DO   10 mg at 05/03/18 0847  . mirtazapine (REMERON) tablet 30 mg  30 mg Oral QHS Jeani Hawking, MD   30 mg at 05/02/18 2211  . multivitamin with minerals tablet 1 tablet  1 tablet Oral Daily Jeani Hawking, MD   1 tablet at 05/03/18 0848  . ondansetron (ZOFRAN) injection 4 mg  4 mg Intravenous Q6H PRN Anderson, Chelsey L, DO   4 mg at 04/29/18 0841  . [START ON 05/04/2018] predniSONE (DELTASONE) tablet 40 mg  40 mg Oral Q breakfast Rumball, Jill Side, DO      . simethicone [facility]) chewable tablet 80 mg  80 mg Oral TID PRN Unknown Jim, DO   80 mg at 05/01/18 0046  . thiamine (VITAMIN B-1) tablet 100 mg  100 mg Oral Daily Jeani Hawking, MD    100 mg at 05/03/18 0848  . valACYclovir (VALTREX) tablet 1,000 mg  1,000 mg Oral TID Dareen Piano, Chelsey L, DO   1,000 mg at 05/03/18 1610     Discharge Medications: Please see discharge summary for a list of discharge medications.  Relevant Imaging Results:  Relevant Lab Results:   Additional Information SSN: [id number] [place], [place]

## 2018-05-04 LAB — GLUCOSE, CAPILLARY
GLUCOSE-CAPILLARY: 122 mg/dL — AB (ref 70–99)
GLUCOSE-CAPILLARY: 139 mg/dL — AB (ref 70–99)
GLUCOSE-CAPILLARY: 198 mg/dL — AB (ref 70–99)
Glucose-Capillary: 276 mg/dL — ABNORMAL HIGH (ref 70–99)

## 2018-05-04 LAB — BASIC METABOLIC PANEL
ANION GAP: 7 (ref 5–15)
BUN: 20 mg/dL (ref 8–23)
CHLORIDE: 99 mmol/L (ref 98–111)
CO2: 30 mmol/L (ref 22–32)
Calcium: 7.8 mg/dL — ABNORMAL LOW (ref 8.9–10.3)
Creatinine, Ser: 0.78 mg/dL (ref 0.44–1.00)
GFR calc non Af Amer: >60 mL/min (ref >60)
Glucose, Bld: 141 mg/dL — ABNORMAL HIGH (ref 70–99)
POTASSIUM: 4.6 mmol/L (ref 3.5–5.1)
SODIUM: 136 mmol/L (ref 135–145)

## 2018-05-04 LAB — CBC
HCT: 22.9 % — ABNORMAL LOW (ref 36.0–46.0)
HEMOGLOBIN: 7.4 g/dL — AB (ref 12.0–15.0)
MCH: 31.2 pg (ref 26.0–34.0)
MCHC: 32.3 g/dL (ref 30.0–36.0)
MCV: 96.6 fL (ref 78.0–100.0)
Platelets: 453 10*3/uL — ABNORMAL HIGH (ref 150–400)
RBC: 2.37 MIL/uL — AB (ref 3.87–5.11)
RDW: 16.4 % — ABNORMAL HIGH (ref 11.5–15.5)
WBC: 12.7 10*3/uL — AB (ref 4.0–10.5)

## 2018-05-04 MED ORDER — LINACLOTIDE 72 MCG PO CAPS
145.0000 ug | ORAL_CAPSULE | Freq: Every day | ORAL | Status: DC
  Administered 2018-05-04 – 2018-05-10 (×3): 145 ug via ORAL
  Filled 2018-05-04: qty 2
  Filled 2018-05-04: qty 1
  Filled 2018-05-04: qty 2
  Filled 2018-05-04 (×3): qty 1
  Filled 2018-05-04: qty 2
  Filled 2018-05-04: qty 1

## 2018-05-04 NOTE — Progress Notes (Signed)
Subjective: Feeling poorly with abdominal cramping.  No bowel movement, but passing flatus.  Objective: Vital signs in last 24 hours: Temp:  [98.4 F (36.9 C)-98.9 F (37.2 C)] 98.4 F (36.9 C) (08/27 0538) Pulse Rate:  [85-88] 85 (08/27 0538) Resp:  [16-18] 16 (08/27 0538) BP: (128-156)/(65-83) 132/79 (08/27 0538) SpO2:  [95 %-97 %] 96 % (08/27 0538) Last BM Date: 05/02/18  Intake/Output from previous day: 08/26 0701 - 08/27 0700 In: -  Out: 1000 [Urine:1000] Intake/Output this shift: No intake/output data recorded.  General appearance: alert and uncomfortable GI: soft, mild tympany, mild tenderness with palpation  Lab Results: Recent Labs    05/02/18 0937 05/03/18 0557 05/04/18 0501  WBC 19.3* 18.8* 12.7*  HGB 8.9* 7.8* 7.4*  HCT 26.7* 23.3* 22.9*  PLT 558* 521* 453*   BMET Recent Labs    05/03/18 0557 05/04/18 0501  NA 132* 136  K 4.2 4.6  CL 100 99  CO2 27 30  GLUCOSE 214* 141*  BUN 21 20  CREATININE 0.80 0.78  CALCIUM 7.4* 7.8*   LFT Recent Labs    05/01/18 0905 05/03/18 0557  PROT 5.0* 5.1*  ALBUMIN 1.8* 1.9*  AST 22 31  ALT 38 46*  ALKPHOS 57 56  BILITOT 0.6 0.4  BILIDIR 0.1  --   IBILI 0.5  --    PT/INR No results for input(s): LABPROT, INR in the last 72 hours. Hepatitis Panel No results for input(s): HEPBSAG, HCVAB, HEPAIGM, HEPBIGM in the last 72 hours. C-Diff No results for input(s): CDIFFTOX in the last 72 hours. Fecal Lactopherrin No results for input(s): FECLLACTOFRN in the last 72 hours.  Studies/Results: Ct Abdomen Pelvis W Contrast  Result Date: 05/02/2018 CLINICAL DATA:  77 year old female with abdominal pain and leukocytosis. New diagnosis of inflammatory bowel disease. EXAM: CT ABDOMEN AND PELVIS WITH CONTRAST TECHNIQUE: Multidetector CT imaging of the abdomen and pelvis was performed using the standard protocol following bolus administration of intravenous contrast. CONTRAST:  100mL ISOVUE-300 IOPAMIDOL (ISOVUE-300)  INJECTION 61% COMPARISON:  Abdominal radiograph dated 04/30/2018 and CT dated 04/23/2018 FINDINGS: Lower chest: Partially visualized small bilateral pleural effusions, left greater than right with associated partial compressive atelectasis of the adjacent lung bases. There is no intra-abdominal free air or free fluid. Hepatobiliary: Multiple small scattered hepatic hypodense lesions are not well characterized but most likely represent cysts. The liver is otherwise unremarkable. No intrahepatic biliary ductal dilatation. The gallbladder is unremarkable. Pancreas: Unremarkable. No pancreatic ductal dilatation or surrounding inflammatory changes. Spleen: Normal in size without focal abnormality. Adrenals/Urinary Tract: The adrenal glands are unremarkable. The kidneys and visualized ureters appear unremarkable as well. There is symmetric enhancement and excretion of contrast by both kidneys. The urinary bladder is distended. Small amount of air within the urinary bladder likely related to recent instrumentation. Clinical correlation is recommended. Stomach/Bowel: There is segmental thickening of the distal descending colon and rectosigmoid with interval improvement compared to the prior CT of 04/23/2018 most consistent with improving colitis. An infiltrative process is less likely. Clinical correlation is recommended. There is loose stool in the distal colon compatible with diarrheal state. Formed stool noted in the proximal colon. There is no evidence of bowel obstruction. There is mild inflammatory changes of the second portion of the duodenum which is new since the prior CT. The appendix is normal. Vascular/Lymphatic: Mild aortoiliac atherosclerotic disease. The SMV, splenic vein, and main portal vein are patent. No portal venous gas. There is no adenopathy. A 5 mm right perirectal lymph node (  series 3, image 70), likely reactive. Reproductive: The uterus is anteverted. There is thickened appearance of the  endometrium for the patient's age. Correlation with ultrasound is recommended for better evaluation of the endometrium. No adnexal mass. Other: There is diffuse subcutaneous edema and anasarca. Musculoskeletal: No acute or significant osseous findings. IMPRESSION: 1. Overall improvement of the colitis since the prior CT. Clinical correlation and follow-up as clinically indicated. There is however interval development of mild inflammatory changes of the duodenum since the prior CT. 2. Interval development of subcutaneous edema as well as bilateral pleural effusions. 3. Thickened endometrium. Further evaluation with pelvic ultrasound recommended. 4.  Aortic Atherosclerosis (ICD10-I70.0). Electronically Signed   By: Elgie Collard M.D.   On: 05/02/2018 21:17    Medications:  Scheduled: . calcium carbonate  1 tablet Oral BID  . docusate sodium  100 mg Oral BID  . feeding supplement (ENSURE ENLIVE)  237 mL Oral TID BM  . feeding supplement (PRO-STAT SUGAR FREE 64)  30 mL Oral Daily  . folic acid  1 mg Oral Daily  . hydrochlorothiazide  25 mg Oral Daily  . insulin aspart  0-9 Units Subcutaneous TID WC  . lidocaine  1 application Urethral Once  . linaclotide  145 mcg Oral QAC breakfast  . lisinopril  10 mg Oral Daily  . mirtazapine  30 mg Oral QHS  . multivitamin with minerals  1 tablet Oral Daily  . polyethylene glycol  17 g Oral BID  . predniSONE  40 mg Oral Q breakfast  . thiamine  100 mg Oral Daily  . valACYclovir  1,000 mg Oral TID   Continuous: . sodium chloride      Assessment/Plan: 1) Left sided UC. 2) Constipation.   No further bloody diarrhea and her WBC has decreased.  She is passing flatus.  Miralax BID as well as docusate were ordered, but she denies having a bowel movement.  A short trial of Linzess will be started to see if it can help to augment Miralax.  Plan: 1) Continue with Miralax for now. 2) Start Linzess. 3) Continue with docusate.  LOS: 11 days   Janice Ramos  D 05/04/2018, 7:01 AM

## 2018-05-04 NOTE — Progress Notes (Signed)
Patient's pasrr is under review. Patient cannot discharge to SNF without a pasrr.  Osborne Cascoadia Sussie Minor LCSW [phone]

## 2018-05-04 NOTE — Progress Notes (Signed)
Family Medicine Teaching Service Daily Progress Note Intern Pager: [phone]  Patient name: Janice Ramos Medical record number: [id number] Date of birth: 1940/10/17 Age: 77 y.o. Gender: female  Primary Care Provider: Joana Reamer, DO Consultants: GI, ophthalmology Code Status: DNR  Pt Overview and Major Events to Date:  8/16- admitted for worsening vomiting and diarrhea 8/19- colonoscopy 8/21- diagnosed ulcerative colitis 8/22- restart of BRBPR 8/23- seen by ophthalmology, valtrex increased to treatment dose  Assessment and Plan: Janice Ramos a 77 y.o.femalepresenting with colitis. 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  Inflammatory colitis with hematochezia 2/2 Ulcerative colitis   Transitioned to PO prednisone yesterday for taper per GI. WBC continues to downtrend. Diarrhea improved however now with constipation with persistent bloating and distension. CT Abd 8/25 with improved colitis as compared to previous on 8/16. No documented stool in the last 24 hours. Tolerating diet.  - continue prednisone taper - GI following, appreciate recs - strict I&Os   Constipation No BM in the past few days, however passing flatus. CT Abd 8/25 with large gas burden Miralax BID, docusate ordered yesterday. GI recommends starting Linzess to augment. No BM documented overnight. - I&Os - continue Miralax, docusate, Linzess - continue mylicon PRN - encourage OOB  Acute Anemia  Hb stable from yesterday, 7.4. Still with some mucous and blood with flatus. Likely related to acute GI blood loss from colitis. Transfusion threshold 7.0. Hold heparin/enoxaparin. Monitor for hemodynamic instability.  - continue to monitor - recommend rechecking at hospital f/u  Urinary Retention, improved - thought to be due to anticholinergic medications. S/p 3 day Foley placement 8/21-8/24 with  successful voiding trial.  No complaints today. - continue to monitor I/O  Fall, mechanical, while admitted (8/19). No concerning physical exam findings after the fall. See separate progress note from that date. Continues to endorse weakness, she thinks she would benefit from continued rehab. PT evaluated and recommended SNF.  - fall precautions  - follow up PT recs - SNF  HSV-2keratitis  - ophthalmology consulted d/t complaints of ocular irritation while inpatient. No complaints of eye irritation today  - treatment dose valtrex 1g TID per optho - artificial tears QID - follow up with [facility] immediately after DC to assess any residual AC inflammation - call ophtho if patient worsens  Essential HTN -BP stable on home regimen. On home lisinopril 10 mg daily, HCTZ 25mg .  - continue lisinopril, HCTZ   Major depression and bipolar disorder- Stable. Home med list includingmethylphenidate, mirtazapine, clonazepam, venlafaxine and zolpidem although confirmed med list with outpt pharmacy on admission.  - continue home mirtazapine - monitor  Hyperglycemia patient has no history of diabetes. Blood sugar likely in setting of steroid use this admission. CBG 141 this am. Received 12u aspart in the last 24 hours.  - sliding scale insulin - carb modified diet  FEN/GI: full diet carb modified PPx: SCD. No heparin due to BRBPR  Disposition: awaiting SNF placement  Subjective:  Patient feeling ok this am. Still no BM. Amenable to get out of bed with assistance.  Objective: Temp:  [98.4 F (36.9 C)-98.9 F (37.2 C)] 98.4 F (36.9 C) (08/27 0538) Pulse Rate:  [85-88] 85 (08/27 0538) Resp:  [16-18] 16 (08/27 0538) BP: (128-156)/(65-83) 132/79 (08/27 0538) SpO2:  [95 %-97 %] 96 % (08/27 0538) Physical Exam: General: elderly female, in NAD Cardiovascular: slightly tachycardic, regular rhythm,  no murmur Respiratory: CTAB, no w/r/r Abdomen: soft, TTP mainly in epigastric region,  persistently distended though not worsened from yesterday. +BS.  Extremities: trace LE edema bilaterally  Laboratory: Recent Labs  Lab 05/02/18 0937 05/03/18 0557 05/04/18 0501  WBC 19.3* 18.8* 12.7*  HGB 8.9* 7.8* 7.4*  HCT 26.7* 23.3* 22.9*  PLT 558* 521* 453*   Recent Labs  Lab 05/01/18 0441 05/01/18 0905 05/03/18 0557 05/04/18 0501  NA 134*  --  132* 136  K 4.5  --  4.2 4.6  CL 100  --  100 99  CO2 26  --  27 30  BUN 18  --  21 20  CREATININE 0.66  --  0.80 0.78  CALCIUM 7.5*  --  7.4* 7.8*  PROT  --  5.0* 5.1*  --   BILITOT  --  0.6 0.4  --   ALKPHOS  --  57 56  --   ALT  --  38 46*  --   AST  --  22 31  --   GLUCOSE 169*  --  214* 141*   Imaging/Diagnostic Tests: No results found. Ellwood DenseRumball, Serenna Deroy, DO 05/04/2018, 8:24 AM PGY-2, [facility] FPTS Intern pager: [phone], text pages welcome

## 2018-05-05 LAB — CBC
HCT: 21.6 % — ABNORMAL LOW (ref 36.0–46.0)
HCT: 24.4 % — ABNORMAL LOW (ref 36.0–46.0)
Hemoglobin: 7.1 g/dL — ABNORMAL LOW (ref 12.0–15.0)
Hemoglobin: 7.9 g/dL — ABNORMAL LOW (ref 12.0–15.0)
MCH: 32 pg (ref 26.0–34.0)
MCH: 32.1 pg (ref 26.0–34.0)
MCHC: 32.9 g/dL (ref 30.0–36.0)
MCHC: 32.4 g/dL (ref 30.0–36.0)
MCV: 97.3 fL (ref 78.0–100.0)
MCV: 99.2 fL (ref 78.0–100.0)
PLATELETS: 469 10*3/uL — AB (ref 150–400)
PLATELETS: 499 10*3/uL — AB (ref 150–400)
RBC: 2.22 MIL/uL — ABNORMAL LOW (ref 3.87–5.11)
RBC: 2.46 MIL/uL — AB (ref 3.87–5.11)
RDW: 16.7 % — AB (ref 11.5–15.5)
RDW: 17.2 % — ABNORMAL HIGH (ref 11.5–15.5)
WBC: 9.1 10*3/uL (ref 4.0–10.5)
WBC: 8.4 10*3/uL (ref 4.0–10.5)

## 2018-05-05 LAB — BASIC METABOLIC PANEL
ANION GAP: 8 (ref 5–15)
BUN: 22 mg/dL (ref 8–23)
CALCIUM: 7.9 mg/dL — AB (ref 8.9–10.3)
CO2: 26 mmol/L (ref 22–32)
CREATININE: 0.77 mg/dL (ref 0.44–1.00)
Chloride: 100 mmol/L (ref 98–111)
GFR calc non Af Amer: >60 mL/min (ref >60)
GLUCOSE: 157 mg/dL — AB (ref 70–99)
Potassium: 4.3 mmol/L (ref 3.5–5.1)
Sodium: 134 mmol/L — ABNORMAL LOW (ref 135–145)

## 2018-05-05 LAB — GLUCOSE, CAPILLARY
GLUCOSE-CAPILLARY: 112 mg/dL — AB (ref 70–99)
GLUCOSE-CAPILLARY: 106 mg/dL — AB (ref 70–99)
Glucose-Capillary: 213 mg/dL — ABNORMAL HIGH (ref 70–99)
Glucose-Capillary: 175 mg/dL — ABNORMAL HIGH (ref 70–99)

## 2018-05-05 MED ORDER — PANTOPRAZOLE SODIUM 40 MG PO TBEC
40.0000 mg | DELAYED_RELEASE_TABLET | Freq: Every day | ORAL | Status: DC
  Administered 2018-05-05 – 2018-05-06 (×2): 40 mg via ORAL
  Filled 2018-05-05 (×2): qty 1

## 2018-05-05 NOTE — Progress Notes (Signed)
Family Medicine Teaching Service Daily Progress Note Intern Pager: [phone]  Patient name: Janice Ramos Medical record number: [id number] Date of birth: June 14, 1941 Age: 77 y.o. Gender: female  Primary Care Provider: Joana Reamer, DO Consultants: GI, ophthalmology Code Status: DNR  Pt Overview and Major Events to Date:  8/16- admitted for worsening vomiting and diarrhea 8/19- colonoscopy 8/21- diagnosed ulcerative colitis 8/22- restart of BRBPR 8/23- seen by ophthalmology, valtrex increased to treatment dose  Assessment and Plan: Janice Ramos a 77 y.o.femalepresenting with colitis. 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  Inflammatory colitis with hematochezia 2/2 Ulcerative colitis   Transitioned to PO prednisone for taper per GI. WBC continues to downtrend. Diarrhea improved with persistent bloating and distension. Patient had a black tarry stool this am. CT Abd 8/25 with improved colitis as compared to previous on 8/16. Tolerating diet. Added Linzess 8/27.  - continue prednisone taper - GI following, appreciate recs  1) EGD tomorrow.  2) Consider transfusion.  3) Start pantoprazole.  4) Follow HGB.  5) Continue with the current laxative regimen. -NPO after midnight  -strict I&Os   Constipation Resolved-patient had dark tarry stool this am. passing flatus. CT Abd 8/25 with large gas burden Miralax BID, docusate and Linzess. - I&Os - continue Miralax, docusate, Linzess - continue mylicon PRN - encourage OOB  Acute Anemia  Hb today is 7.1- will recheck this afternoon. Hold heparin/enoxaparin. Monitor for hemodynamic instability.  - continue to monitor - recommend rechecking at hospital f/u  Urinary Retention, improved - thought to be due to anticholinergic medications. S/p 3 day Foley placement 8/21-8/24 with successful voiding trial.  No complaints  today. - continue to monitor I/O  Fall, mechanical, while admitted (8/19). No concerning physical exam findings after the fall. See separate progress note from that date. Continues to endorse weakness, she thinks she would benefit from continued rehab. PT evaluated and recommended SNF.  - fall precautions  - follow up PT recs - SNF  HSV-2keratitis  - ophthalmology consulted d/t complaints of ocular irritation while inpatient. No complaints of eye irritation today  - treatment dose valtrex 1g TID per optho - artificial tears QID - follow up with [facility] immediately after DC to assess any residual AC inflammation - call ophtho if patient worsens  Essential HTN -BP stable on home regimen. On home lisinopril 10 mg daily, HCTZ 25mg .  - continue lisinopril, HCTZ   Major depression and bipolar disorder- Stable. Home med list includingmethylphenidate, mirtazapine, clonazepam, venlafaxine and zolpidem although confirmed med list with outpt pharmacy on admission.  - continue home mirtazapine - monitor  Hyperglycemia patient has no history of diabetes. Blood sugar likely in setting of steroid use this admission. CBG 141 this am. Received 12u aspart in the last 24 hours.  - sliding scale insulin - carb modified diet  FEN/GI: full diet carb modified, NPO after midnight PPx: SCD. No heparin due to BRBPR  Disposition: med surg another midnight at least  Subjective:  Patient still having crampy, bloaty abdominal pain, flatulence and one black, tarry BM,   Objective: Temp:  [97.6 F (36.4 C)-98.3 F (36.8 C)] 98.1 F (36.7 C) (08/28 0456) Pulse Rate:  [88-95] 88 (08/28 0456) Resp:  [18] 18 (08/28 0456) BP: (116-131)/(57-66) 131/66 (08/28 0456) SpO2:  [96 %-100 %] 96 % (08/28 0456)   Physical Exam: General: elderly female, in NAD Cardiovascular: RRR  no murmur Respiratory: CTAB, no w/r/r Abdomen: soft, TTP mainly in epigastric region,distended, no fluid wave, +BS.   Extremities: trace LE edema bilaterally  Laboratory: Recent Labs  Lab 05/03/18 0557 05/04/18 0501 05/05/18 0342  WBC 18.8* 12.7* 9.1  HGB 7.8* 7.4* 7.1*  HCT 23.3* 22.9* 21.6*  PLT 521* 453* 469*   Recent Labs  Lab 05/01/18 0905 05/03/18 0557 05/04/18 0501 05/05/18 0342  NA  --  132* 136 134*  K  --  4.2 4.6 4.3  CL  --  100 99 100  CO2  --  27 30 26   BUN  --  21 20 22   CREATININE  --  0.80 0.78 0.77  CALCIUM  --  7.4* 7.8* 7.9*  PROT 5.0* 5.1*  --   --   BILITOT 0.6 0.4  --   --   ALKPHOS 57 56  --   --   ALT 38 46*  --   --   AST 22 31  --   --   GLUCOSE  --  214* 141* 157*   Imaging/Diagnostic Tests: No results found. Leeroy BockAnderson, Chelsey L, DO 05/05/2018, 6:03 AM PGY-1, [facility] FPTS Intern pager: [phone], text pages welcome

## 2018-05-05 NOTE — Progress Notes (Signed)
Subjective: "Terrible night".  Gas cramping.  Also some "black tarry" stool.  Objective: Vital signs in last 24 hours: Temp:  [97.6 F (36.4 C)-98.3 F (36.8 C)] 98.1 F (36.7 C) (08/28 0456) Pulse Rate:  [88-95] 88 (08/28 0456) Resp:  [18] 18 (08/28 0456) BP: (116-131)/(57-66) 131/66 (08/28 0456) SpO2:  [96 %-100 %] 96 % (08/28 0456) Last BM Date: 05/02/18  Intake/Output from previous day: 08/27 0701 - 08/28 0700 In: 496 [P.O.:496] Out: 1500 [Urine:1500] Intake/Output this shift: No intake/output data recorded.  General appearance: alert and no distress GI: moderately distended, tympanic, mild lower abdominal tenderness  Lab Results: Recent Labs    05/03/18 0557 05/04/18 0501 05/05/18 0342  WBC 18.8* 12.7* 9.1  HGB 7.8* 7.4* 7.1*  HCT 23.3* 22.9* 21.6*  PLT 521* 453* 469*   BMET Recent Labs    05/03/18 0557 05/04/18 0501 05/05/18 0342  NA 132* 136 134*  K 4.2 4.6 4.3  CL 100 99 100  CO2 27 30 26   GLUCOSE 214* 141* 157*  BUN 21 20 22   CREATININE 0.80 0.78 0.77  CALCIUM 7.4* 7.8* 7.9*   LFT Recent Labs    05/03/18 0557  PROT 5.1*  ALBUMIN 1.9*  AST 31  ALT 46*  ALKPHOS 56  BILITOT 0.4   PT/INR No results for input(s): LABPROT, INR in the last 72 hours. Hepatitis Panel No results for input(s): HEPBSAG, HCVAB, HEPAIGM, HEPBIGM in the last 72 hours. C-Diff No results for input(s): CDIFFTOX in the last 72 hours. Fecal Lactopherrin No results for input(s): FECLLACTOFRN in the last 72 hours.  Studies/Results: No results found.  Medications:  Scheduled: . calcium carbonate  1 tablet Oral BID  . docusate sodium  100 mg Oral BID  . feeding supplement (ENSURE ENLIVE)  237 mL Oral TID BM  . feeding supplement (PRO-STAT SUGAR FREE 64)  30 mL Oral Daily  . folic acid  1 mg Oral Daily  . hydrochlorothiazide  25 mg Oral Daily  . insulin aspart  0-9 Units Subcutaneous TID WC  . lidocaine  1 application Urethral Once  . linaclotide  145 mcg Oral QAC  breakfast  . lisinopril  10 mg Oral Daily  . mirtazapine  30 mg Oral QHS  . multivitamin with minerals  1 tablet Oral Daily  . polyethylene glycol  17 g Oral BID  . predniSONE  40 mg Oral Q breakfast  . thiamine  100 mg Oral Daily  . valACYclovir  1,000 mg Oral TID   Continuous: . sodium chloride      Assessment/Plan: 1) Left sided UC. 2) Melena. 3) Anemia.   She was able to have some small bowel movements with the laxative regimen, however, she reports melenic stools.  Further evaluation is necessary with an EGD as there is a decline in her HGB and a newer finding of inflammation in the duodenum.  Clinically, her pain does not correlate with the upper GI findings with the most recent CT scan, but the reports of melena suggest some true pathology.  The patient can have melena at this stage of treatment with her left sided UC, but it is not a typically finding.  There is a persistent abdominal distension.  Plan: 1) EGD tomorrow. 2) Consider transfusion. 3) Start pantoprazole. 4) Follow HGB. 5) Continue with the current laxative regimen.  LOS: 12 days   Janice Ramos 05/05/2018, 7:03 AM

## 2018-05-05 NOTE — Progress Notes (Signed)
Family Medicine Teaching Service Daily Progress Note Intern Pager: [phone]  Patient name: Janice Ramos Medical record number: [id number] Date of birth: 12-Feb-1941 Age: 77 y.o. Gender: female  Primary Care Provider: Joana Reamer, DO Consultants: GI, ophthalmology Code Status: DNR  Pt Overview and Major Events to Date:  8/16- admitted for worsening vomiting and diarrhea 8/19- colonoscopy 8/21- diagnosed ulcerative colitis 8/22- restart of BRBPR 8/23- seen by ophthalmology, valtrex increased to treatment dose 8/29- EGD  Assessment and Plan: Janice Ramos a 77 y.o.femalepresenting with colitis. 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  Inflammatory colitis with hematochezia 2/2 Ulcerative colitis   Transitioned to PO prednisone for taper per GI. WBC continues to downtrend. Diarrhea and abdominal distension/tenderness improved today. Patient had a black tarry stool this am. CT Abd 8/25 with improved colitis as compared to previous on 8/16. Tolerating diet. Another black/tarry stool this morning - continue prednisone taper - GI following, appreciate recs  1) EGD today  2) Consider transfusion if Hgb <7  3) continue pantoprazole.  4) Follow HGB.  5) Continue with the current laxative regimen. -NPO until after EGD -strict I&Os  Constipation Resolved-patient had dark tarry stools. passing flatus. CT Abd 8/25 with large gas burden Miralax BID and Linzess. - I&Os - continue Miralax, Linzess -discontinue docusate - continue mylicon PRN - encourage OOB  Acute Anemia  Hb 7.6 today. Hold heparin/enoxaparin. Monitor for hemodynamic instability.  - continue to monitor -added SCDs for prophylaxis - recommend rechecking at hospital f/u  Urinary Retention, improved - thought to be due to anticholinergic medications. S/p 3 day Foley placement 8/21-8/24 with successful  voiding trial.  No complaints today. - continue to monitor I/O  Fall, mechanical, while admitted (8/19). No concerning physical exam findings after the fall. See separate progress note from that date. PT evaluated and recommended SNF and rolling walker with 5" wheels.  - fall precautions  - follow up PT recs  HSV-2keratitis  - ophthalmology consulted d/t complaints of ocular irritation while inpatient. No complaints of eye irritation today  - treatment dose valtrex 1g TID per optho - artificial tears QID - follow up with [facility] immediately after DC to assess any residual AC inflammation - call ophtho if patient worsens  Essential HTN -BP stable on home regimen. On home lisinopril 10 mg daily, HCTZ 25mg .  - continue lisinopril, HCTZ   Major depression and bipolar disorder- Stable. Home med list includingmethylphenidate, mirtazapine, clonazepam, venlafaxine and zolpidem although confirmed med list with outpt pharmacy on admission.  - continue home mirtazapine - monitor  Hyperglycemia patient has no history of diabetes. Blood sugar likely in setting of steroid use this admission. CBG 114 today. - sliding scale insulin - carb modified diet  FEN/GI: full diet carb modified, NPO after midnight PPx: SCD. No heparin due to BRBPR  Disposition: med surg another midnight at least  Subjective:  Pvernight: no acute events Today: Patient still having cramping abdominal pain, but is improved. Continued flatulence and one small black, tarry BM.    Objective: Temp:  [97.6 F (36.4 C)-98.1 F (36.7 C)] 98.1 F (36.7 C) (08/28 0456) Pulse Rate:  [88-94] 92 (08/28 1412) Resp:  [18] 18 (08/28 1412) BP: (115-131)/(60-66) 115/65 (08/28 1412) SpO2:  [96 %-100 %] 97 % (08/28 1412)   Physical Exam: General: elderly female, in NAD, she appeared frail and tired Cardiovascular: RRR no murmur  Respiratory: CTAB Abdomen: soft, TTP mainly in epigastric region,distended, no fluid wave,  +BS.  Extremities: no LE edema today  Laboratory: Recent Labs  Lab 05/04/18 0501 05/05/18 0342 05/05/18 1449  WBC 12.7* 9.1 8.4  HGB 7.4* 7.1* 7.9*  HCT 22.9* 21.6* 24.4*  PLT 453* 469* 499*   Recent Labs  Lab 05/01/18 0905 05/03/18 0557 05/04/18 0501 05/05/18 0342  NA  --  132* 136 134*  K  --  4.2 4.6 4.3  CL  --  100 99 100  CO2  --  27 30 26   BUN  --  21 20 22   CREATININE  --  0.80 0.78 0.77  CALCIUM  --  7.4* 7.8* 7.9*  PROT 5.0* 5.1*  --   --   BILITOT 0.6 0.4  --   --   ALKPHOS 57 56  --   --   ALT 38 46*  --   --   AST 22 31  --   --   GLUCOSE  --  214* 141* 157*   Imaging/Diagnostic Tests: EGD today  Leeroy Bocknderson, Chelsey L, DO 05/05/2018, 4:15 PM PGY-1, [facility] FPTS Intern pager: [phone], text pages welcome

## 2018-05-05 NOTE — Progress Notes (Signed)
Pasrr number received for SNF once medically stable.   Osborne Cascoadia [place] LCSW [phone]

## 2018-05-06 ENCOUNTER — Encounter (HOSPITAL_COMMUNITY): Payer: Self-pay | Admitting: *Deleted

## 2018-05-06 ENCOUNTER — Inpatient Hospital Stay (HOSPITAL_COMMUNITY): Payer: Medicare Other | Admitting: Anesthesiology

## 2018-05-06 ENCOUNTER — Encounter (HOSPITAL_COMMUNITY)
Admission: EM | Disposition: A | Discharge status: Skilled Nursing Facility | Payer: Self-pay | Source: Home / Self Care | Attending: Family Medicine

## 2018-05-06 HISTORY — PX: ESOPHAGOGASTRODUODENOSCOPY: SHX5428

## 2018-05-06 HISTORY — PX: BIOPSY: SHX5522

## 2018-05-06 LAB — COMPREHENSIVE METABOLIC PANEL
ALBUMIN: 2 g/dL — AB (ref 3.5–5.0)
ALT: 54 U/L — ABNORMAL HIGH (ref 0–44)
AST: 23 U/L (ref 15–41)
Alkaline Phosphatase: 57 U/L (ref 38–126)
Anion gap: 9 (ref 5–15)
BUN: 21 mg/dL (ref 8–23)
CHLORIDE: 98 mmol/L (ref 98–111)
CO2: 27 mmol/L (ref 22–32)
Calcium: 7.9 mg/dL — ABNORMAL LOW (ref 8.9–10.3)
Creatinine, Ser: 0.74 mg/dL (ref 0.44–1.00)
GFR calc Af Amer: >60 mL/min (ref >60)
GFR calc non Af Amer: >60 mL/min (ref >60)
GLUCOSE: 186 mg/dL — AB (ref 70–99)
POTASSIUM: 4.6 mmol/L (ref 3.5–5.1)
Sodium: 134 mmol/L — ABNORMAL LOW (ref 135–145)
Total Bilirubin: 0.3 mg/dL (ref 0.3–1.2)
Total Protein: 5.2 g/dL — ABNORMAL LOW (ref 6.5–8.1)

## 2018-05-06 LAB — CBC
HEMATOCRIT: 23.3 % — AB (ref 36.0–46.0)
Hemoglobin: 7.6 g/dL — ABNORMAL LOW (ref 12.0–15.0)
MCH: 32.3 pg (ref 26.0–34.0)
MCHC: 32.6 g/dL (ref 30.0–36.0)
MCV: 99.1 fL (ref 78.0–100.0)
PLATELETS: 452 10*3/uL — AB (ref 150–400)
RBC: 2.35 MIL/uL — AB (ref 3.87–5.11)
RDW: 17.7 % — AB (ref 11.5–15.5)
WBC: 7.4 10*3/uL (ref 4.0–10.5)

## 2018-05-06 LAB — GLUCOSE, CAPILLARY
GLUCOSE-CAPILLARY: 255 mg/dL — AB (ref 70–99)
Glucose-Capillary: 115 mg/dL — ABNORMAL HIGH (ref 70–99)
Glucose-Capillary: 114 mg/dL — ABNORMAL HIGH (ref 70–99)
Glucose-Capillary: 217 mg/dL — ABNORMAL HIGH (ref 70–99)

## 2018-05-06 SURGERY — EGD (ESOPHAGOGASTRODUODENOSCOPY)
Anesthesia: Monitor Anesthesia Care

## 2018-05-06 MED ORDER — PHENYLEPHRINE HCL 10 MG/ML IJ SOLN
INTRAMUSCULAR | Status: DC | PRN
  Administered 2018-05-06: 40 ug via INTRAVENOUS

## 2018-05-06 MED ORDER — LIDOCAINE HCL (CARDIAC) PF 100 MG/5ML IV SOSY
PREFILLED_SYRINGE | INTRAVENOUS | Status: DC | PRN
  Administered 2018-05-06: 30 mg via INTRATRACHEAL

## 2018-05-06 MED ORDER — PROPOFOL 500 MG/50ML IV EMUL
INTRAVENOUS | Status: DC | PRN
  Administered 2018-05-06: 50 ug/kg/min via INTRAVENOUS

## 2018-05-06 MED ORDER — LACTATED RINGERS IV SOLN
INTRAVENOUS | Status: DC | PRN
  Administered 2018-05-06: 14:00:00 via INTRAVENOUS

## 2018-05-06 MED ORDER — SUCRALFATE 1 GM/10ML PO SUSP
1.0000 g | Freq: Three times a day (TID) | ORAL | Status: DC
  Administered 2018-05-06 – 2018-05-10 (×7): 1 g via ORAL
  Filled 2018-05-06 (×10): qty 10

## 2018-05-06 MED ORDER — FLUCONAZOLE 100 MG PO TABS
100.0000 mg | ORAL_TABLET | Freq: Every day | ORAL | Status: DC
  Administered 2018-05-06 – 2018-05-10 (×5): 100 mg via ORAL
  Filled 2018-05-06 (×5): qty 1

## 2018-05-06 MED ORDER — PROPOFOL 10 MG/ML IV BOLUS
INTRAVENOUS | Status: DC | PRN
  Administered 2018-05-06 (×2): 20 mg via INTRAVENOUS

## 2018-05-06 NOTE — Op Note (Signed)
[facility] Patient Name: Janice Ramos Procedure Date : 05/06/2018 MRN: [id number] Attending MD: Jeani Hawking , MD Date of Birth: 12-27-1940 CSN: [id number] Age: 77 Admit Type: Inpatient Procedure:                Upper GI endoscopy Indications:              Acute post hemorrhagic anemia, Melena, Abnormal CT                            of the GI tract Providers:                Jeani Hawking, MD, Tomma Rakers, RN, Arlee Muslim Tech., Technician, Carmela Rima                            CRNA, CRNA Referring MD:              Medicines:                Propofol per Anesthesia Complications:            No immediate complications. Estimated Blood Loss:     Estimated blood loss was minimal. Procedure:                Pre-Anesthesia Assessment:                           - Prior to the procedure, a History and Physical                            was performed, and patient medications and                            allergies were reviewed. The patient's tolerance of                            previous anesthesia was also reviewed. The risks                            and benefits of the procedure and the sedation                            options and risks were discussed with the patient.                            All questions were answered, and informed consent                            was obtained. Prior Anticoagulants: The patient has                            taken no previous anticoagulant or antiplatelet  agents. ASA Grade Assessment: II - A patient with                            mild systemic disease. After reviewing the risks                            and benefits, the patient was deemed in                            satisfactory condition to undergo the procedure.                           - Sedation was administered by an anesthesia                            professional. Deep sedation was attained.                         After obtaining informed consent, the endoscope was                            passed under direct vision. Throughout the                            procedure, the patient's blood pressure, pulse, and                            oxygen saturations were monitored continuously. The                            GIF-H190 ([id number] Olympus Adult EGD was introduced                            through the mouth, and advanced to the second part                            of duodenum. The upper GI endoscopy was                            accomplished without difficulty. The patient                            tolerated the procedure well. Scope In: Scope Out: Findings:      Localized, white plaques were found in the lower third of the esophagus.      The entire examined stomach was normal. Biopsies were taken with a cold       forceps for Helicobacter pylori testing.      Two non-bleeding cratered duodenal ulcers with no stigmata of bleeding       were found in the duodenal bulb. The largest lesion was 8 mm in largest       dimension. Impression:               - Esophageal plaques were found, consistent with  candidiasis.                           - Normal stomach. Biopsied.                           - Multiple non-bleeding duodenal ulcers with no                            stigmata of bleeding. Recommendation:           - Return patient to hospital ward for ongoing care.                           - Resume regular diet.                           - Continue present medications.                           - Use sucralfate tablets 1 gram PO QID.                           - Diflucan (fluconazole) 100 mg PO daily for 10                            days. Procedure Code(s):        --- Professional ---                           [phone], Esophagogastroduodenoscopy, flexible,                            transoral; with biopsy, single or multiple Diagnosis Code(s):         --- Professional ---                           K22.9, Disease of esophagus, unspecified                           K26.9, Duodenal ulcer, unspecified as acute or                            chronic, without hemorrhage or perforation                           D62, Acute posthemorrhagic anemia                           K92.1, Melena (includes Hematochezia)                           R93.3, Abnormal findings on diagnostic imaging of                            other parts of digestive tract CPT copyright 2017 [employer]. All rights reserved. The codes documented in this report are preliminary and upon coder review  may  be revised to meet current compliance requirements. Jeani Hawking, MD Jeani Hawking, MD 05/06/2018 2:37:21 PM This report has been signed electronically. Number of Addenda: 0

## 2018-05-06 NOTE — Progress Notes (Addendum)
Nutrition Follow-up  INTERVENTION:   - Continue Ensure Enlive po TID, each supplement provides 350 kcal and 20 grams of protein (vanilla flavor)  - Continue Pro-stat daily, each supplement provides 100 kcal and 15 grams of protein  - Encourage adequate PO intake  NUTRITION DIAGNOSIS:   Inadequate oral intake related to altered GI function as evidenced by meal completion < 50%.  Progressing  GOAL:   Patient will meet greater than or equal to 90% of their needs  Progressing  MONITOR:   PO intake, Supplement acceptance, Labs, Weight trends, I & O's, Skin  ASSESSMENT:   77 y.o. female with history of diabetes and IBS presenting with 2 months of gradually worsening vomiting and diarrhea.  CT abdomen pelvis with contrast showed wall thickening and inflammatory change in the descending and sigmoid colons most consistent with an inflammatory colitis.   8/17 - diet advanced to clear liquids, pt declined golytely 8/19 - s/p colonoscopy with appearance consistent with IBD, distribution favors UC 8/20 - pt dx with Ulcerative Colitis 8/29 - EGD  Pt currently at EGD procedure, NPO until after procedure Per GI pt having some small BM with laxative regimen but they are melenic stools  Meal Completion: 100% last few days  Medications reviewed and include: Tums BID, 20 ml Pro-stat daily, 1 mg folic acid daily, sliding scale Novolog, 30 mg Remeron daily, MVI with minerals daily, 100 mg thiamine daily, 17 g Miralax BID, 40 mg Prednisone daily  Labs reviewed: hemoglobin 7.6 (L), HCT 23.3 (L) CBG (last 3)  Recent Labs    05/05/18 2059 05/06/18 0803 05/06/18 1218  GLUCAP 175* 115* 114*    Diet Order:   Diet Order            Diet NPO time specified  Diet effective midnight              EDUCATION NEEDS:   Not appropriate for education at this time  Skin:  Skin Assessment: Reviewed RN Assessment  Last BM:  8/28 - medium type 6  Height:   Ht Readings from Last 1  Encounters:  05/06/18 5\' 2"  (1.575 m)    Weight:   Wt Readings from Last 1 Encounters:  05/06/18 59 kg    Ideal Body Weight:  50 kg  BMI:  Body mass index is 23.79 kg/m.  Estimated Nutritional Needs:   Kcal:  1550-1750  Protein:  70-85 grams  Fluid:  >/= 1.5 L/day  Kendell BaneHeather Gable Odonohue RD, LDN, CNSC [phone] Pager [phone] After Hours Pager

## 2018-05-06 NOTE — Progress Notes (Signed)
Physical Therapy Treatment Patient Details Name: Janice Ramos MRN: [id number] DOB: 1940/10/16 Today's Date: 05/06/2018    History of Present Illness 77 y.o. female presenting with gradual onset nausea and vomiting in setting of Inflammatory colitis with hematochezia. Plan for EGD today, 8/29.    PT Comments    Patient progressing slowly towards PT goals. Continues to demonstrate weakness and fatigue impacting mobility requiring standing rest breaks. 2/4 DOE.  Supposed to have EGD today to determine course of action. Eager to increase mobility. Encouraged ambulation with nursing. Plans to d/c to SNF in order to maximize independence and prevent falls.     Follow Up Recommendations  Supervision/Assistance - 24 hour;SNF     Equipment Recommendations  Rolling walker with 5" wheels    Recommendations for Other Services       Precautions / Restrictions Precautions Precautions: Fall Restrictions Weight Bearing Restrictions: No    Mobility  Bed Mobility Overal bed mobility: Needs Assistance Bed Mobility: Rolling;Sidelying to Sit;Sit to Supine Rolling: Supervision Sidelying to sit: Supervision   Sit to supine: Supervision   General bed mobility comments: Supervision for safety, use of rail.  Transfers Overall transfer level: Needs assistance Equipment used: Rolling walker (2 wheeled) Transfers: Sit to/from Stand Sit to Stand: Min guard         General transfer comment: Min guard for safety.   Ambulation/Gait Ambulation/Gait assistance: Min guard Gait Distance (Feet): 125 Feet Assistive device: Rolling walker (2 wheeled) Gait Pattern/deviations: Step-through pattern;Decreased stride length;Shuffle;Trunk flexed Gait velocity: 1.26 ft/sec Gait velocity interpretation: <1.31 ft/sec, indicative of household ambulator General Gait Details: Slow and mildly unsteady gait, fatigues easily requiring standing rest breaks. 2/4 DOE. Cues to reduce reliance on RW.   Stairs             Wheelchair Mobility    Modified Rankin (Stroke Patients Only)       Balance Overall balance assessment: History of Falls;Needs assistance Sitting-balance support: No upper extremity supported;Feet supported Sitting balance-Leahy Scale: Good     Standing balance support: During functional activity Standing balance-Leahy Scale: Fair                              Cognition Arousal/Alertness: Awake/alert Behavior During Therapy: WFL for tasks assessed/performed Overall Cognitive Status: Within Functional Limits for tasks assessed                                 General Comments: Angry about her procedure getting moved to this PM and not being able to eat.       Exercises      General Comments        Pertinent Vitals/Pain Pain Assessment: No/denies pain    Home Living                      Prior Function            PT Goals (current goals can now be found in the care plan section) Progress towards PT goals: Progressing toward goals    Frequency    Min 2X/week      PT Plan Current plan remains appropriate    Co-evaluation              AM-PAC PT "6 Clicks" Daily Activity  Outcome Measure  Difficulty turning over in bed (including adjusting bedclothes, sheets and blankets)?: None Difficulty  moving from lying on back to sitting on the side of the bed? : None Difficulty sitting down on and standing up from a chair with arms (e.g., wheelchair, bedside commode, etc,.)?: None Help needed moving to and from a bed to chair (including a wheelchair)?: A Little Help needed walking in hospital room?: A Little Help needed climbing 3-5 steps with a railing? : A Lot 6 Click Score: 20    End of Session Equipment Utilized During Treatment: Gait belt Activity Tolerance: Patient limited by fatigue Patient left: in bed;with call bell/phone within reach;with bed alarm set Nurse Communication: Mobility status PT  Visit Diagnosis: Unsteadiness on feet (R26.81);Difficulty in walking, not elsewhere classified (R26.2);Muscle weakness (generalized) (M62.81)     Time: 1308-6578 PT Time Calculation (min) (ACUTE ONLY): 17 min  Charges:  $Gait Training: 8-22 mins                    [place], [place], DPT [phone]     Marcy Panning 05/06/2018, 12:35 PM

## 2018-05-06 NOTE — Anesthesia Postprocedure Evaluation (Signed)
Anesthesia Post Note  Patient: Janice Ramos  Procedure(s) Performed: ESOPHAGOGASTRODUODENOSCOPY (EGD) (N/A ) BIOPSY     Patient location during evaluation: PACU Anesthesia Type: MAC Level of consciousness: awake and alert Pain management: pain level controlled Vital Signs Assessment: post-procedure vital signs reviewed and stable Respiratory status: spontaneous breathing, nonlabored ventilation and respiratory function stable Cardiovascular status: stable and blood pressure returned to baseline Anesthetic complications: no    Last Vitals:  Vitals:   05/06/18 1440 05/06/18 1450  BP: (!) 106/53 113/61  Pulse: 81 86  Resp: 20 20  Temp:    SpO2: 98% 94%    Last Pain:  Vitals:   05/06/18 1450  TempSrc:   PainSc: 0-No pain                 Audry Pili

## 2018-05-06 NOTE — Anesthesia Preprocedure Evaluation (Addendum)
Anesthesia Evaluation  Patient identified by MRN, date of birth, ID band Patient awake    Reviewed: Allergy & Precautions, NPO status , Patient's Chart, lab work & pertinent test results  History of Anesthesia Complications Negative for: history of anesthetic complications  Airway Mallampati: III  TM Distance: >3 FB   Mouth opening: Limited Mouth Opening  Dental  (+) Teeth Intact, Dental Advidsory Given   Pulmonary neg pulmonary ROS,    breath sounds clear to auscultation       Cardiovascular hypertension, Pt. on medications  Rhythm:Regular Rate:Normal     Neuro/Psych  Headaches, PSYCHIATRIC DISORDERS Anxiety Depression Bipolar Disorder    GI/Hepatic Neg liver ROS, PUD,  IBS    Endo/Other  diabetes  Renal/GU negative Renal ROS  negative genitourinary   Musculoskeletal negative musculoskeletal ROS (+)   Abdominal   Peds  Hematology  (+) anemia ,   Anesthesia Other Findings HSV  Reproductive/Obstetrics                           Anesthesia Physical Anesthesia Plan  ASA: III  Anesthesia Plan: MAC   Post-op Pain Management:    Induction: Intravenous  PONV Risk Score and Plan: 2 and Propofol infusion and Treatment may vary due to age or medical condition  Airway Management Planned: Nasal Cannula and Natural Airway  Additional Equipment: None  Intra-op Plan:   Post-operative Plan:   Informed Consent: I have reviewed the patients History and Physical, chart, labs and discussed the procedure including the risks, benefits and alternatives for the proposed anesthesia with the patient or authorized representative who has indicated his/her understanding and acceptance.   Dental advisory given  Plan Discussed with: CRNA and Anesthesiologist  Anesthesia Plan Comments: (Discussed DNR with patient. Patient agreeable to IV fluids and vasopressors. Patient also agreeable to intubation and  CPR as indicated, if cause of arrest is felt to be reversible. )      Anesthesia Quick Evaluation

## 2018-05-06 NOTE — Transfer of Care (Signed)
Immediate Anesthesia Transfer of Care Note  Patient: Janice Ramos  Procedure(s) Performed: ESOPHAGOGASTRODUODENOSCOPY (EGD) (N/A ) BIOPSY  Patient Location: Endoscopy Unit  Anesthesia Type:MAC  Level of Consciousness: awake, alert  and oriented  Airway & Oxygen Therapy: Patient Spontanous Breathing and Patient connected to nasal cannula oxygen  Post-op Assessment: Report given to RN, Post -op Vital signs reviewed and stable and Patient moving all extremities X 4  Post vital signs: Reviewed and stable  Last Vitals:  Vitals Value Taken Time  BP    Temp    Pulse    Resp    SpO2      Last Pain:  Vitals:   05/06/18 1339  TempSrc: Oral  PainSc: 0-No pain      Patients Stated Pain Goal: 2 (61/22/44 9753)  Complications: No apparent anesthesia complications

## 2018-05-06 NOTE — Anesthesia Procedure Notes (Signed)
Procedure Name: MAC Date/Time: 05/06/2018 2:16 PM Performed by: Neldon Newport, CRNA Pre-anesthesia Checklist: Timeout performed, Suction available, Patient being monitored, Emergency Drugs available and Patient identified Patient Re-evaluated:Patient Re-evaluated prior to induction Oxygen Delivery Method: Nasal cannula

## 2018-05-07 ENCOUNTER — Encounter (HOSPITAL_COMMUNITY): Payer: Self-pay | Admitting: Gastroenterology

## 2018-05-07 DIAGNOSIS — B3781 Candidal esophagitis: Secondary | ICD-10-CM

## 2018-05-07 DIAGNOSIS — K269 Duodenal ulcer, unspecified as acute or chronic, without hemorrhage or perforation: Secondary | ICD-10-CM

## 2018-05-07 LAB — PREPARE RBC (CROSSMATCH)

## 2018-05-07 LAB — CBC
HEMATOCRIT: 21.7 % — AB (ref 36.0–46.0)
HEMOGLOBIN: 7 g/dL — AB (ref 12.0–15.0)
MCH: 32 pg (ref 26.0–34.0)
MCHC: 32.3 g/dL (ref 30.0–36.0)
MCV: 99.1 fL (ref 78.0–100.0)
Platelets: 442 10*3/uL — ABNORMAL HIGH (ref 150–400)
RBC: 2.19 MIL/uL — ABNORMAL LOW (ref 3.87–5.11)
RDW: 17.5 % — AB (ref 11.5–15.5)
WBC: 6.3 10*3/uL (ref 4.0–10.5)

## 2018-05-07 LAB — GLUCOSE, CAPILLARY
GLUCOSE-CAPILLARY: 187 mg/dL — AB (ref 70–99)
Glucose-Capillary: 96 mg/dL (ref 70–99)
Glucose-Capillary: 242 mg/dL — ABNORMAL HIGH (ref 70–99)
Glucose-Capillary: 197 mg/dL — ABNORMAL HIGH (ref 70–99)

## 2018-05-07 MED ORDER — HYOSCYAMINE SULFATE 0.125 MG SL SUBL: 0.1250 mg | SUBLINGUAL_TABLET | Freq: Four times a day (QID) | SUBLINGUAL | Status: DC | PRN

## 2018-05-07 MED ORDER — SODIUM CHLORIDE 0.9% IV SOLUTION
Freq: Once | INTRAVENOUS | Status: AC
  Administered 2018-05-07: 09:00:00 via INTRAVENOUS
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
## 2018-05-07 NOTE — Clinical Social Work Note (Signed)
Clinical Social Work Assessment  Patient Details  Name: Janice Ramos MRN: [id number] Date of Birth: [birth year]  Date of referral:  05/07/18               Reason for consult:  Facility Placement                Permission sought to share information with:  Family Supports Permission granted to share information::  Yes, Verbal Permission Granted  Name::     Public affairs consultantCaitlin  Agency::  SNFs  Relationship::  Daughter  Contact Information:  [phone]  Housing/Transportation Living arrangements for the past 2 months:  Single Family Home Source of Information:  Patient, Adult Children Patient Interpreter Needed:  None Criminal Activity/Legal Involvement Pertinent to Current Situation/Hospitalization:  No - Comment as needed Significant Relationships:  Adult Children Lives with:  Self Do you feel safe going back to the place where you live?  No Need for family participation in patient care:  Yes (Comment)  Care giving concerns:  CSW received consult for possible SNF placement at time of discharge. CSW spoke with patient and daughter regarding PT recommendation of SNF placement at time of discharge. Patient's daughter reported that she is currently unable to care for patient at their home given patient's current physical needs and fall risk. Patient expressed understanding of PT recommendation and is agreeable to SNF placement at time of discharge. CSW to continue to follow and assist with discharge planning needs.   Social Worker assessment / plan:  CSW spoke with patient and daughter [facility].  Employment status:  Retired Health and safety inspectornsurance information:  Medicare PT Recommendations:  Skilled Nursing Facility Information / Referral to community resources:  Skilled Nursing Facility  Patient/Family's Response to care:  Patient and daughter recognizes need for rehab before returning home and [place]. Patient reported  preference for [facility].  Patient/Family's Understanding of and Emotional Response to Diagnosis, Current Treatment, and Prognosis:  Patient/family is realistic regarding therapy needs and expressed being hopeful for SNF placement. Patient expressed understanding of CSW role and discharge process as well as medical condition. No questions/concerns about plan or treatment.    Emotional Assessment Appearance:  Appears stated age Attitude/Demeanor/Rapport:  Self-Confident Affect (typically observed):  Accepting Orientation:  Oriented to Self, Oriented to Place, Oriented to  Time, Oriented to Situation Alcohol / Substance use:  Not Applicable Psych involvement (Current and /or in the community):  No (Comment)  Discharge Needs  Concerns to be addressed:  Care Coordination Readmission within the last 30 days:  No Current discharge risk:  None Barriers to Discharge:  Continued Medical Work up   [employer] S Lashane Whelpley, LCSWA 05/07/2018, 5:58 PM

## 2018-05-07 NOTE — Progress Notes (Signed)
CSW continuing to follow for discharge to SNF when stable.  Osborne Cascoadia Anayansi Rundquist LCSW [phone]

## 2018-05-07 NOTE — Progress Notes (Signed)
Family Medicine Teaching Service Daily Progress Note Intern Pager: [phone]  Patient name: Janice Ramos Medical record number: [id number] Date of birth: Jun 26, 1941 Age: 77 y.o. Gender: female  Primary Care Provider: Joana Reamer, DO Consultants: GI, ophthalmology Code Status: DNR  Pt Overview and Major Events to Date:  8/16- admitted for worsening vomiting and diarrhea 8/19- colonoscopy 8/21- diagnosed ulcerative colitis 8/22- restart of BRBPR 8/23- seen by ophthalmology, valtrex increased to treatment dose 8/29- EGD  Assessment and Plan: JALAYSHA SKILTON a 77 y.o.femalepresenting with colitis. 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  Inflammatory colitis with hematochezia 2/2 Ulcerative colitis   Transitioned to PO prednisone for taper per GI. WBC continues to downtrend. Diarrhea and abdominal distension/tenderness improved today. EGD yesterday showed non-bleeding duodenal ulcers and esophagitis consistant with candidiasis. Started on sucralfate and diflucan with normal diet - continue prednisone taper - GI following, appreciate recs - continue pantoprazole, GI cocktail, sucralfate and diflucan -strict I&Os  Constipation Resolved-patient had dark tarry stools. passing flatus. burden Miralax BID and Linzess. - I&Os - continue Miralax, Linzess - continue mylicon PRN - encourage OOB  Acute Anemia  Hb 7.0 today. Hold heparin/enoxaparin. Monitor for hemodynamic instability. Transfusing 2 units RBC today with post transfusion CBCs - continue to monitor -added SCDs for prophylaxis - transfuse 2 uPRB  Urinary Retention, resolved -S/p 3 day Foley placement 8/21-8/24 with successful voiding trial.  No complaints today.  - continue to monitor I/O -avoid anticholinergics   Fall, mechanical, while admitted (8/19). No concerning physical exam findings after the fall.  See separate progress note from that date. PT evaluated and recommended SNF and rolling walker with 5" wheels.  - fall precautions  - follow up PT recs  HSV-2keratitis  - ophthalmology consulted d/t complaints of ocular irritation while inpatient. No complaints of eye irritation today  - treatment dose valtrex 1g TID per optho - artificial tears QID - follow up with [facility] immediately after DC to assess any residual AC inflammation - call ophtho if patient worsens  Essential HTN -BP stable on home regimen. On home lisinopril 10 mg daily, HCTZ 25mg .  - continue lisinopril, HCTZ   Major depression and bipolar disorder- Stable. Home med list includingmethylphenidate, mirtazapine, clonazepam, venlafaxine and zolpidem although confirmed med list with outpt pharmacy on admission.  - continue home mirtazapine - monitor  Hyperglycemia patient has no history of diabetes. Blood sugar likely in setting of steroid use this admission. CBG 96 today. - sliding scale insulin - carb modified diet  FEN/GI: full diet carb modified, NPO after midnight PPx: SCD. No heparin due to BRBPR  Disposition: med surg another midnight at least  Subjective:  Pvernight: no acute events, patient states that she did not sleep well do to the abdominal cramping and diarrhea Today: Patient still having cramping abdominal pain, but is improved. Continued flatulence and burping. Continues to have small, loose, black BM periodically  Objective: Temp:  [98.2 F (36.8 C)-98.9 F (37.2 C)] 98.3 F (36.8 C) (08/30 0517) Pulse Rate:  [81-94] 94 (08/30 0517) Resp:  [12-23] 16 (08/30 0517) BP: (104-127)/(49-63) 123/62 (08/30 0517) SpO2:  [94 %-99 %] 98 % (08/30 0517) Weight:  [59 kg] 59 kg (08/29 1339)   Physical Exam: General: elderly female, in NAD, she appeared frail and tired Cardiovascular: RRR no murmur Respiratory: CTAB Abdomen: soft, TTP in lower quadrants, no epigastric tenderness,distension  appears almost resolved, no fluid wave, +BS.  Extremities: no LE edema today  Laboratory: Recent Labs  Lab 05/05/18 1449 05/06/18 0504 05/07/18 0258  WBC 8.4 7.4 6.3  HGB 7.9* 7.6* 7.0*  HCT 24.4* 23.3* 21.7*  PLT 499* 452* 442*   Recent Labs  Lab 05/01/18 0905 05/03/18 0557 05/04/18 0501 05/05/18 0342 05/06/18 0504  NA  --  132* 136 134* 134*  K  --  4.2 4.6 4.3 4.6  CL  --  100 99 100 98  CO2  --  27 30 26 27   BUN  --  21 20 22 21   CREATININE  --  0.80 0.78 0.77 0.74  CALCIUM  --  7.4* 7.8* 7.9* 7.9*  PROT 5.0* 5.1*  --   --  5.2*  BILITOT 0.6 0.4  --   --  0.3  ALKPHOS 57 56  --   --  57  ALT 38 46*  --   --  54*  AST 22 31  --   --  23  GLUCOSE  --  214* 141* 157* 186*   Imaging/Diagnostic Tests: EGD today  Leeroy BockAnderson, Naod Sweetland L, DO 05/07/2018, 7:00 AM PGY-1, [facility] FPTS Intern pager: [phone], text pages welcome

## 2018-05-08 DIAGNOSIS — D62 Acute posthemorrhagic anemia: Secondary | ICD-10-CM

## 2018-05-08 LAB — GLUCOSE, CAPILLARY
GLUCOSE-CAPILLARY: 299 mg/dL — AB (ref 70–99)
Glucose-Capillary: 130 mg/dL — ABNORMAL HIGH (ref 70–99)
Glucose-Capillary: 117 mg/dL — ABNORMAL HIGH (ref 70–99)
Glucose-Capillary: 164 mg/dL — ABNORMAL HIGH (ref 70–99)

## 2018-05-08 LAB — TYPE AND SCREEN
ABO/RH(D): O POS
Antibody Screen: NEGATIVE
UNIT DIVISION: 0
Unit division: 0

## 2018-05-08 LAB — CBC
HEMATOCRIT: 33.8 % — AB (ref 36.0–46.0)
Hemoglobin: 11 g/dL — ABNORMAL LOW (ref 12.0–15.0)
MCH: 30.7 pg (ref 26.0–34.0)
MCHC: 32.5 g/dL (ref 30.0–36.0)
MCV: 94.4 fL (ref 78.0–100.0)
Platelets: 464 10*3/uL — ABNORMAL HIGH (ref 150–400)
RBC: 3.58 MIL/uL — ABNORMAL LOW (ref 3.87–5.11)
RDW: 19.1 % — AB (ref 11.5–15.5)
WBC: 6.6 10*3/uL (ref 4.0–10.5)

## 2018-05-08 LAB — BPAM RBC
Blood Product Expiration Date: 201909062359
Blood Product Expiration Date: 201909252359
ISSUE DATE / TIME: 201908301433
ISSUE DATE / TIME: 201908301714
UNIT TYPE AND RH: 5100
Unit Type and Rh: 5100

## 2018-05-08 NOTE — Progress Notes (Signed)
Blood transfusion completed without any reaction. Will continue to monitor. 

## 2018-05-08 NOTE — Progress Notes (Signed)
Family Medicine Teaching Service Daily Progress Note Intern Pager: [phone]  Patient name: Janice Ramos Medical record number: [id number] Date of birth: April 17, 1941 Age: 77 y.o. Gender: female  Primary Care Provider: Joana Reamer, DO Consultants: GI, ophthalmology Code Status: DNR  Pt Overview and Major Events to Date:  8/16- admitted for worsening vomiting and diarrhea 8/19- colonoscopy 8/21- diagnosed ulcerative colitis 8/22- restart of BRBPR 8/23- seen by ophthalmology, valtrex increased to treatment dose 8/29- EGD 8/31 - 2u pRBC   Assessment and Plan: Janice Ramos a 77 y.o.femalepresenting with colitis. 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  Inflammatory colitis with hematochezia 2/2 Ulcerative colitis   Transitioned to PO prednisone for taper per GI. WBC now normalized. Diarrhea resolved however with persistent abdominal pain although noted to have some chronic abdominal pain per GI discussion with daughter. Likely contributing. Will not trial hyoscyamine after discussion with GI due to previous urinary retention with anticholinergics. Ready for transfer to SNF when bed available. - continue prednisone taper - GI signed off - continue pantoprazole, GI cocktail, sucralfate and diflucan - strict I&Os  Esophageal Candidiasis Visualized on EGD. Started on diflucan. - continue diflucan (8/26 - 9/8)  Constipation, resolved Resumption of dark tarry stools 8/28, now without blood per patient. Miralax BID and Linzess. - I&Os - continue Miralax, Linzess - continue mylicon PRN - encourage OOB  Acute Anemia  Hb 7.0>11.0 s/p 2u pRBC yesterday. Continue to hold heparin/enoxaparin. Monitor for hemodynamic instability.  - continue to monitor - added SCDs for prophylaxis  Urinary Retention, resolved - S/p 3 day Foley placement 8/21-8/24 with successful voiding  trial.  No complaints today. 2 unmeasured UOP last 24 hours. - continue to monitor I/O - avoid anticholinergics   Fall, mechanical, while admitted (8/19). No concerning physical exam findings after the fall. See separate progress note from that date. PT evaluated and recommended SNF and rolling walker with 5" wheels.  - fall precautions  - follow up PT recs  HSV-2keratitis  - ophthalmology consulted d/t complaints of ocular irritation while inpatient. No complaints of eye irritation today  - treatment dose valtrex 1g TID per optho - artificial tears QID - follow up with [facility] immediately after DC to assess any residual AC inflammation - call ophtho if patient worsens  Essential HTN   Slightly HTN overnight, 157/80 this am. BP stable on home regimen. On home lisinopril 10 mg daily, HCTZ 25mg .  - continue lisinopril, HCTZ   Major depression and bipolar disorder- Stable. Home med list includingmethylphenidate, mirtazapine, clonazepam, venlafaxine and zolpidem although confirmed med list with outpt pharmacy on admission.  - continue home mirtazapine - monitor  Hyperglycemia patient has no history of diabetes. Blood sugar likely in setting of steroid use this admission. CBG 197 today. - sliding scale insulin - carb modified diet  FEN/GI: full diet carb modified PPx: SCD. No heparin due to BRBPR  Disposition: medically stable for discharge when SNF bed available  Subjective:  Patient rested well overnight. Endorses chronic abdominal pain. No bloody BM.  Objective: Temp:  [98.2 F (36.8 C)-98.6 F (37 C)] 98.2 F (36.8 C) (08/31 0510) Pulse Rate:  [83-96] 83 (08/31 0510) Resp:  [18-20] 19 (08/31 0510) BP: (95-157)/(59-80) 157/80 (08/31 0510) SpO2:  [98 %-100 %] 99 % (08/31 0510)   Physical Exam: General: elderly female, in NAD, sitting in bed Cardiovascular: RRR no m/r/g Respiratory:  CTAB, no w/r/r Abdomen: soft, non tender to palpation, slight distension,  improved from previous. +BS.  Extremities: no LE edema  Laboratory: Recent Labs  Lab 05/06/18 0504 05/07/18 0258 05/08/18 0512  WBC 7.4 6.3 6.6  HGB 7.6* 7.0* 11.0*  HCT 23.3* 21.7* 33.8*  PLT 452* 442* 464*   Recent Labs  Lab 05/01/18 0905  05/03/18 0557 05/04/18 0501 05/05/18 0342 05/06/18 0504  NA  --    < > 132* 136 134* 134*  K  --    < > 4.2 4.6 4.3 4.6  CL  --    < > 100 99 100 98  CO2  --    < > 27 30 26 27   BUN  --    < > 21 20 22 21   CREATININE  --    < > 0.80 0.78 0.77 0.74  CALCIUM  --    < > 7.4* 7.8* 7.9* 7.9*  PROT 5.0*  --  5.1*  --   --  5.2*  BILITOT 0.6  --  0.4  --   --  0.3  ALKPHOS 57  --  56  --   --  57  ALT 38  --  46*  --   --  54*  AST 22  --  31  --   --  23  GLUCOSE  --    < > 214* 141* 157* 186*   < > = values in this interval not displayed.   Imaging/Diagnostic Tests: No results found.  Ellwood DenseRumball, Yanely Mast, DO 05/08/2018, 7:51 AM PGY-2, [facility] FPTS Intern pager: [phone], text pages welcome

## 2018-05-08 NOTE — Progress Notes (Signed)
Subjective: Overall feeling well.  Still with some abdominal pain.  Objective: Vital signs in last 24 hours: Temp:  [98.2 F (36.8 C)-98.6 F (37 C)] 98.2 F (36.8 C) (08/31 0510) Pulse Rate:  [83-96] 83 (08/31 0510) Resp:  [18-20] 19 (08/31 0510) BP: (95-157)/(59-80) 157/80 (08/31 0510) SpO2:  [98 %-100 %] 99 % (08/31 0510) Last BM Date: 05/07/18  Intake/Output from previous day: 08/30 0701 - 08/31 0700 In: 1720 [P.O.:360; I.V.:100; Blood:1260] Out: -  Intake/Output this shift: No intake/output data recorded.  General appearance: alert and no distress GI: mild lower abdominal pain, mild abdominal distension  Lab Results: Recent Labs    05/06/18 0504 05/07/18 0258 05/08/18 0512  WBC 7.4 6.3 6.6  HGB 7.6* 7.0* 11.0*  HCT 23.3* 21.7* 33.8*  PLT 452* 442* 464*   BMET Recent Labs    05/06/18 0504  NA 134*  K 4.6  CL 98  CO2 27  GLUCOSE 186*  BUN 21  CREATININE 0.74  CALCIUM 7.9*   LFT Recent Labs    05/06/18 0504  PROT 5.2*  ALBUMIN 2.0*  AST 23  ALT 54*  ALKPHOS 57  BILITOT 0.3   PT/INR No results for input(s): LABPROT, INR in the last 72 hours. Hepatitis Panel No results for input(s): HEPBSAG, HCVAB, HEPAIGM, HEPBIGM in the last 72 hours. C-Diff No results for input(s): CDIFFTOX in the last 72 hours. Fecal Lactopherrin No results for input(s): FECLLACTOFRN in the last 72 hours.  Studies/Results: No results found.  Medications:  Scheduled: . calcium carbonate  1 tablet Oral BID  . feeding supplement (ENSURE ENLIVE)  237 mL Oral TID BM  . feeding supplement (PRO-STAT SUGAR FREE 64)  30 mL Oral Daily  . fluconazole  100 mg Oral Daily  . folic acid  1 mg Oral Daily  . hydrochlorothiazide  25 mg Oral Daily  . insulin aspart  0-9 Units Subcutaneous TID WC  . lidocaine  1 application Urethral Once  . linaclotide  145 mcg Oral QAC breakfast  . lisinopril  10 mg Oral Daily  . mirtazapine  30 mg Oral QHS  . multivitamin with minerals  1 tablet  Oral Daily  . polyethylene glycol  17 g Oral BID  . predniSONE  40 mg Oral Q breakfast  . sucralfate  1 g Oral TID WC & HS  . thiamine  100 mg Oral Daily  . valACYclovir  1,000 mg Oral TID   Continuous: . sodium chloride      Assessment/Plan: 1) Left sided UC. 2) Anemia s/p transfusion. 3) Chronic abdominal pain.   The patient feels well today.  I spoke with her daughter at length yesterday and obtain very good background history.  She has a long history of chronic abdominal pain and it appears that she is at her baseline.  She will always have some type of pain, but it does not appear that it is from her UC.  She is medically stable and improving from the GI standpoint.    Plan: 1) Continue with the steroid taper as previously outlined. 2) Continue with her laxative regimen. 3) Transfer to SNF for rehab per primary service. 4) Patient will follow up in the office in 2 weeks. 5) Signing off.  LOS: 15 days   Janice Ramos D 05/08/2018, 7:56 AM

## 2018-05-09 LAB — CBC
HCT: 33.2 % — ABNORMAL LOW (ref 36.0–46.0)
Hemoglobin: 10.8 g/dL — ABNORMAL LOW (ref 12.0–15.0)
MCH: 30.7 pg (ref 26.0–34.0)
MCHC: 32.5 g/dL (ref 30.0–36.0)
MCV: 94.3 fL (ref 78.0–100.0)
PLATELETS: 444 10*3/uL — AB (ref 150–400)
RBC: 3.52 MIL/uL — AB (ref 3.87–5.11)
RDW: 18.8 % — AB (ref 11.5–15.5)
WBC: 6.4 10*3/uL (ref 4.0–10.5)

## 2018-05-09 LAB — GLUCOSE, CAPILLARY
GLUCOSE-CAPILLARY: 96 mg/dL (ref 70–99)
GLUCOSE-CAPILLARY: 242 mg/dL — AB (ref 70–99)
Glucose-Capillary: 274 mg/dL — ABNORMAL HIGH (ref 70–99)
Glucose-Capillary: 247 mg/dL — ABNORMAL HIGH (ref 70–99)

## 2018-05-09 NOTE — Progress Notes (Signed)
Family Medicine Teaching Service Daily Progress Note Intern Pager: [phone]  Patient name: Janice Ramos Medical record number: [id number] Date of birth: 28-Jul-1941 Age: 77 y.o. Gender: female  Primary Care Provider: Joana Reamer, DO Consultants: GI, ophthalmology Code Status: DNR  Pt Overview and Major Events to Date:  8/16- admitted for worsening vomiting and diarrhea 8/19- colonoscopy 8/21- diagnosed ulcerative colitis 8/22- restart of BRBPR 8/23- seen by ophthalmology, valtrex increased to treatment dose 8/29- EGD 8/31 - 2u pRBC   Assessment and Plan: Janice Ramos a 77 y.o.femalepresenting with colitis. 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  Inflammatory colitis with hematochezia 2/2 Ulcerative colitis with duodenal ulcer Transitioned to PO prednisone for taper per GI. WBC now normalized. Patient reports some mild constipation and abdominal discomfort that seems consistent with her chronic abdominal pain. Will not trial hyoscyamine after discussion with GI due to previous urinary retention with anticholinergics. Ready for transfer to SNF when bed available. - continue oral prednisone taper - GI signed off on 8/31 - continue pantoprazole, GI cocktail, sucralfate and diflucan - strict I&Os  Esophageal Candidiasis Visualized on EGD. Started on diflucan. - continue diflucan (8/26 - 9/8)  Constipation, stable Resumption of dark tarry stools 8/28, now without blood per patient. Miralax BID and Linzess. - I&Os - continue Miralax, Linzess - continue mylicon PRN - encourage OOB  Acute Anemia  Hb 7.0>11.0>10.8 s/p 2u pRBC 8/30. Continue to hold heparin/enoxaparin. Monitor for hemodynamic instability.  - continue to monitor -SCDs for DVT prophylaxis  Urinary Retention, resolved - S/p 4 day Foley placement 8/21-8/24 with successful voiding trial.  No  complaints today. 2 unmeasured UOP last 24 hours. - continue to monitor I/O - avoid anticholinergics   Fall, mechanical, while admitted (8/19). No concerning physical exam findings after the fall. See separate progress note from that date. PT evaluated and recommended SNF and rolling walker with 5" wheels.  - fall precautions  - follow up PT recs  HSV-2keratitis  - ophthalmology consulted d/t complaints of ocular irritation while inpatient. No complaints of eye irritation today  - treatment dose valtrex 1g TID per optho - artificial tears QID - follow up with [facility] immediately after DC to assess any residual AC inflammation - call ophtho if patient worsens  Essential HTN   Slightly HTN overnight, 124-144 systolic and 76-79 diastolic past 24 hours. BP stable on home regimen. On home lisinopril 10 mg daily, HCTZ 25mg .  - continue lisinopril, HCTZ   Major depression and bipolar disorder- Stable. Home med list includingmethylphenidate, mirtazapine, clonazepam, venlafaxine and zolpidem although confirmed med list with outpt pharmacy on admission.  - continue home mirtazapine - monitor  Hyperglycemia patient has no history of diabetes. Blood sugar likely in setting of steroid use this admission. CBG 197 today. - sliding scale insulin - carb modified diet  FEN/GI: full diet carb modified PPx: SCD. No heparin due to BRBPR  Disposition: medically stable for discharge when SNF bed available  Subjective:  Patient had nausea and vomiting overnight that required IV Zofran which has now resolved. She continues to have abdominal pain that is unchanged. Still reports melanic stools but no frank blood.   Objective: Temp:  [97.6 F (36.4 C)-99.3 F (37.4 C)] 98.3 F (36.8 C) (09/01 0545) Pulse Rate:  [80-87] 80 (09/01 0545) Resp:  [16-20] 16 (09/01 0545) BP: (125-144)/(76-79) 125/77 (09/01 0545) SpO2:  [94 %-  100 %] 98 % (09/01 0545)   Physical Exam: Gen: Alert and Oriented x  3, NAD HEENT: Normocephalic, atraumatic CV: RRR, no murmurs, normal S1, S2 split Resp: CTAB, no wheezing, rales, or rhonchi, comfortable work of breathing Abd: slightly distended, diffusely tender to palpation with RLQ and LLQ most tender, soft, +bs in all four quadrants MSK: Moves all four extremities Ext: no clubbing, cyanosis, or edema Skin: warm, dry, intact, no rashes  Laboratory: Recent Labs  Lab 05/07/18 0258 05/08/18 0512 05/09/18 0358  WBC 6.3 6.6 6.4  HGB 7.0* 11.0* 10.8*  HCT 21.7* 33.8* 33.2*  PLT 442* 464* 444*   Recent Labs  Lab 05/03/18 0557 05/04/18 0501 05/05/18 0342 05/06/18 0504  NA 132* 136 134* 134*  K 4.2 4.6 4.3 4.6  CL 100 99 100 98  CO2 27 30 26 27   BUN 21 20 22 21   CREATININE 0.80 0.78 0.77 0.74  CALCIUM 7.4* 7.8* 7.9* 7.9*  PROT 5.1*  --   --  5.2*  BILITOT 0.4  --   --  0.3  ALKPHOS 56  --   --  57  ALT 46*  --   --  54*  AST 31  --   --  23  GLUCOSE 214* 141* 157* 186*   Imaging/Diagnostic Tests: No results found.  Arlyce Harman, DO 05/09/2018, 5:51 AM PGY-2, [facility] FPTS Intern pager: [phone], text pages welcome

## 2018-05-10 LAB — CBC
HEMATOCRIT: 34 % — AB (ref 36.0–46.0)
HEMOGLOBIN: 10.7 g/dL — AB (ref 12.0–15.0)
MCH: 30.3 pg (ref 26.0–34.0)
MCHC: 31.5 g/dL (ref 30.0–36.0)
MCV: 96.3 fL (ref 78.0–100.0)
Platelets: 407 10*3/uL — ABNORMAL HIGH (ref 150–400)
RBC: 3.53 MIL/uL — ABNORMAL LOW (ref 3.87–5.11)
RDW: 18.5 % — ABNORMAL HIGH (ref 11.5–15.5)
WBC: 6.1 10*3/uL (ref 4.0–10.5)

## 2018-05-10 LAB — GLUCOSE, CAPILLARY
GLUCOSE-CAPILLARY: 92 mg/dL (ref 70–99)
Glucose-Capillary: 198 mg/dL — ABNORMAL HIGH (ref 70–99)

## 2018-05-10 MED ORDER — PREDNISONE 5 MG PO TABS: 5.0000 mg | ORAL_TABLET | Freq: Every day | ORAL | Status: DC

## 2018-05-10 MED ORDER — FLUCONAZOLE 100 MG PO TABS: 100.0000 mg | ORAL_TABLET | Freq: Every day | ORAL | 0 refills | Status: AC

## 2018-05-10 MED ORDER — HYPROMELLOSE (GONIOSCOPIC) 2.5 % OP SOLN: 1.0000 [drp] | Freq: Four times a day (QID) | OPHTHALMIC | 0 refills | Status: AC | PRN

## 2018-05-10 MED ORDER — FOLIC ACID 1 MG PO TABS: 1.0000 mg | ORAL_TABLET | Freq: Every day | ORAL | 0 refills | Status: AC

## 2018-05-10 MED ORDER — ADULT MULTIVITAMIN W/MINERALS CH: 1.0000 | ORAL_TABLET | Freq: Every day | ORAL | 0 refills | Status: AC

## 2018-05-10 MED ORDER — CALCIUM CARBONATE ANTACID 500 MG PO CHEW: 1.0000 | CHEWABLE_TABLET | Freq: Two times a day (BID) | ORAL | 0 refills | Status: AC

## 2018-05-10 MED ORDER — PREDNISONE 10 MG PO TABS: 10.0000 mg | ORAL_TABLET | Freq: Every day | ORAL | Status: DC

## 2018-05-10 MED ORDER — PREDNISONE 20 MG PO TABS: 20.0000 mg | ORAL_TABLET | Freq: Every day | ORAL | 0 refills | Status: DC

## 2018-05-10 MED ORDER — VALACYCLOVIR HCL 1 G PO TABS: 1000.0000 mg | ORAL_TABLET | Freq: Three times a day (TID) | ORAL | 0 refills | Status: AC

## 2018-05-10 MED ORDER — PREDNISONE 5 MG PO TABS: 15.0000 mg | ORAL_TABLET | Freq: Every day | ORAL | 0 refills | Status: AC

## 2018-05-10 MED ORDER — PREDNISONE 20 MG PO TABS: 20.0000 mg | ORAL_TABLET | Freq: Every day | ORAL | Status: DC

## 2018-05-10 MED ORDER — LINACLOTIDE 145 MCG PO CAPS: 145.0000 ug | ORAL_CAPSULE | Freq: Every day | ORAL | 0 refills | Status: AC

## 2018-05-10 MED ORDER — THIAMINE HCL 100 MG PO TABS: 100.0000 mg | ORAL_TABLET | Freq: Every day | ORAL | 0 refills | Status: AC

## 2018-05-10 MED ORDER — PREDNISONE 10 MG PO TABS: 10.0000 mg | ORAL_TABLET | Freq: Every day | ORAL | 0 refills | Status: AC

## 2018-05-10 MED ORDER — PREDNISONE 5 MG PO TABS: 5.0000 mg | ORAL_TABLET | Freq: Every day | ORAL | 0 refills | Status: AC

## 2018-05-10 MED ORDER — PREDNISONE 5 MG PO TABS: 15.0000 mg | ORAL_TABLET | Freq: Every day | ORAL | Status: DC

## 2018-05-10 MED ORDER — ENSURE ENLIVE PO LIQD: 237.0000 mL | Freq: Three times a day (TID) | ORAL | 12 refills | Status: DC

## 2018-05-10 MED ORDER — PREDNISONE 10 MG PO TABS: 30.0000 mg | ORAL_TABLET | Freq: Every day | ORAL | 0 refills | Status: AC

## 2018-05-10 MED ORDER — PREDNISONE 20 MG PO TABS: 40.0000 mg | ORAL_TABLET | Freq: Every day | ORAL | 0 refills | Status: DC

## 2018-05-10 MED ORDER — PREDNISONE 20 MG PO TABS: 30.0000 mg | ORAL_TABLET | Freq: Every day | ORAL | Status: DC

## 2018-05-10 MED ORDER — POLYETHYLENE GLYCOL 3350 17 G PO PACK: 17.0000 g | PACK | Freq: Two times a day (BID) | ORAL | 0 refills | Status: AC

## 2018-05-10 NOTE — Progress Notes (Signed)
Patient will DC to: [facility] Anticipated DC date: 05/10/18 Family notified: Daughter, Teacher, English as a foreign language by: PTAR 3:30pm   Per MD patient ready for DC to [facility]. RN, patient, patient's family, and facility notified of DC. Discharge Summary sent to facility. RN given number for report [phone] Room 306). DC packet on chart. Ambulance transport requested for patient.   CSW signing off.  Cristobal Goldmann, LCSW Clinical Social Worker [phone]

## 2018-05-10 NOTE — Progress Notes (Signed)
Adele Dan to be D/C'd Skilled nursing facility per MD order.  Discussed prescriptions and follow up appointments with the patient. Prescriptions given to patient, medication list explained in detail. Pt verbalized understanding.  Allergies as of 05/10/2018      Reactions   Latex Hives      Medication List    STOP taking these medications   ALPRAZolam 1 MG tablet Commonly known as:  XANAX   hydrOXYzine 10 MG tablet Commonly known as:  ATARAX/VISTARIL   methylphenidate 20 MG tablet Commonly known as:  RITALIN   vitamin C 500 MG tablet Commonly known as:  ASCORBIC ACID     TAKE these medications   calcium carbonate 500 MG chewable tablet Commonly known as:  TUMS - dosed in mg elemental calcium Chew 1 tablet (200 mg of elemental calcium total) by mouth 2 (two) times daily.   feeding supplement (ENSURE ENLIVE) Liqd Take 237 mLs by mouth 3 (three) times daily between meals.   fluconazole 100 MG tablet Commonly known as:  DIFLUCAN Take 1 tablet (100 mg total) by mouth daily for 5 days. Start taking on:  05/11/2018   folic acid 1 MG tablet Commonly known as:  FOLVITE Take 1 tablet (1 mg total) by mouth daily. Start taking on:  05/11/2018   hydrochlorothiazide 25 MG tablet Commonly known as:  HYDRODIURIL Take 1 tablet (25 mg total) by mouth daily.   hydroxypropyl methylcellulose / hypromellose 2.5 % ophthalmic solution Commonly known as:  ISOPTO TEARS / GONIOVISC Place 1 drop into both eyes 4 (four) times daily as needed for dry eyes.   linaclotide 145 MCG Caps capsule Commonly known as:  LINZESS Take 1 capsule (145 mcg total) by mouth daily before breakfast. Start taking on:  05/11/2018   lisinopril 10 MG tablet Commonly known as:  PRINIVIL,ZESTRIL Take 1 tablet (10 mg total) by mouth daily.   mirtazapine 30 MG tablet Commonly known as:  REMERON Take 1 tablet (30 mg total) by mouth at bedtime.   multivitamin with minerals Tabs tablet Take 1 tablet by mouth  daily. Start taking on:  05/11/2018   polyethylene glycol packet Commonly known as:  MIRALAX / GLYCOLAX Take 17 g by mouth 2 (two) times daily.   predniSONE 20 MG tablet Commonly known as:  DELTASONE Take 2 tablets (40 mg total) by mouth daily with breakfast. Start taking on:  05/11/2018   predniSONE 10 MG tablet Commonly known as:  DELTASONE Take 3 tablets (30 mg total) by mouth daily with breakfast for 14 days. Start taking on:  05/12/2018   predniSONE 20 MG tablet Commonly known as:  DELTASONE Take 1 tablet (20 mg total) by mouth daily with breakfast for 14 days. Start taking on:  05/26/2018   predniSONE 5 MG tablet Commonly known as:  DELTASONE Take 3 tablets (15 mg total) by mouth daily with breakfast for 14 days. Start taking on:  06/09/2018   predniSONE 10 MG tablet Commonly known as:  DELTASONE Take 1 tablet (10 mg total) by mouth daily with breakfast for 14 days. Start taking on:  06/23/2018   predniSONE 5 MG tablet Commonly known as:  DELTASONE Take 1 tablet (5 mg total) by mouth daily with breakfast for 14 days. Start taking on:  07/07/2018   thiamine 100 MG tablet Take 1 tablet (100 mg total) by mouth daily. Start taking on:  05/11/2018   valACYclovir 1000 MG tablet Commonly known as:  VALTREX Take 1 tablet (1,000 mg total) by mouth  3 (three) times daily for 4 days. What changed:    medication strength  how much to take  when to take this       Vitals:   05/10/18 0610 05/10/18 1358  BP: (!) 143/79 111/70  Pulse: 76 95  Resp: 16 16  Temp: 98.1 F (36.7 C) 99 F (37.2 C)  SpO2: 99% 97%    Skin clean, dry and intact without evidence of skin break down, no evidence of skin tears noted. IV catheter discontinued intact. Site without signs and symptoms of complications. Dressing and pressure applied. Pt denies pain at this time. No complaints noted.  An After Visit Summary was printed and given to the patient. Patient escorted via stretcher with  PTAR.  Fara Boros BSN, RN [employer] [facility] Phone [phone]

## 2018-05-10 NOTE — Progress Notes (Addendum)
11:41am- Patient's daughter agreeable to discharge. She has completed paperwork with [facility].   10am-CSW left voicemail for patient's daughter to discuss transfer to SNF today.   Osborne Casco Khyler Eschmann LCSW [phone]

## 2018-05-10 NOTE — Clinical Social Work Placement (Signed)
   CLINICAL SOCIAL WORK PLACEMENT  NOTE  Date:  05/10/2018  Patient Details  Name: Janice Ramos MRN: [id number] Date of Birth: 1941/03/06  Clinical Social Work is seeking post-discharge placement for this patient at the Skilled  Nursing Facility level of care (*CSW will initial, date and re-position this form in  chart as items are completed):  Yes   Patient/family provided with [place] Clinical Social Work Department's list of facilities offering this level of care within the geographic area requested by the patient (or if unable, by the patient's family).  Yes   Patient/family informed of their freedom to choose among providers that offer the needed level of care, that participate in Medicare, Medicaid or managed care program needed by the patient, have an available bed and are willing to accept the patient.  Yes   Patient/family informed of 's ownership interest in [facility] and [facility], as well as of the fact that they are under no obligation to receive care at these facilities.  PASRR submitted to EDS on 05/05/18     PASRR number received on 05/06/18     Existing PASRR number confirmed on       FL2 transmitted to all facilities in geographic area requested by pt/family on 05/05/18     FL2 transmitted to all facilities within larger geographic area on       Patient informed that his/her managed care company has contracts with or will negotiate with certain facilities, including the following:        Yes   Patient/family informed of bed offers received.  Patient chooses bed at [facility]     Physician recommends and patient chooses bed at      Patient to be transferred to [facility] on 05/10/18.  Patient to be transferred to facility by PTAR     Patient family notified on 05/10/18 of transfer.  Name of family member notified:  Luther Parody, daughter     PHYSICIAN Please prepare priority discharge summary,  including medications, Please sign DNR     Additional Comment:    _______________________________________________ Mearl Latin, LCSWA 05/10/2018, 12:12 PM

## 2018-05-11 ENCOUNTER — Non-Acute Institutional Stay (SKILLED_NURSING_FACILITY): Payer: Medicare Other | Admitting: Internal Medicine

## 2018-05-11 ENCOUNTER — Encounter: Payer: Self-pay | Admitting: Internal Medicine

## 2018-05-11 DIAGNOSIS — B0052 Herpesviral keratitis: Secondary | ICD-10-CM

## 2018-05-11 DIAGNOSIS — K51811 Other ulcerative colitis with rectal bleeding: Secondary | ICD-10-CM

## 2018-05-11 DIAGNOSIS — B3781 Candidal esophagitis: Secondary | ICD-10-CM

## 2018-05-11 DIAGNOSIS — G4709 Other insomnia: Secondary | ICD-10-CM | POA: Diagnosis not present

## 2018-05-11 NOTE — Assessment & Plan Note (Addendum)
Initial steroid dose was 40 mg daily with decrease of 10 mg every 2 weeks. After  2 weeks of 10 mg daily she was to be decreased to 5 mg daily for 2 weeks with discontinuation of the steroids  Schedule reviewed & verified  in [facility].

## 2018-05-11 NOTE — Assessment & Plan Note (Addendum)
Ophthalmology has prescribed Valtrex 3 times a day 4 days and artificial tears She refuses to take more than 500 mg of Valtrex daily. She states this is a recurrent problem and she is comfortable with this decision. She states she has no active eye symptoms at this time. F/U with [facility]

## 2018-05-11 NOTE — Patient Instructions (Signed)
See assessment and plan under each diagnosis in the problem list and acutely for this visit 

## 2018-05-11 NOTE — Assessment & Plan Note (Signed)
Course of Diflucan for 2 weeks

## 2018-05-11 NOTE — Progress Notes (Signed)
NURSING HOME LOCATION:  [facility] ROOM NUMBER:  308-A  CODE STATUS:  DNR  PCP:  Orpah Cobb P, DO [address] [place] [address]  This is a comprehensive admission note to [facility] performed on this date less than 30 days from date of admission. Included are preadmission medical/surgical history; reconciled medication list; family history; social history and comprehensive review of systems.    Corrections and additions to the records were documented. Comprehensive physical exam was also performed. Additionally a clinical summary was entered for each active diagnosis pertinent to this admission in the Problem List to enhance continuity of care.  HPI: The patient was hospitalized 8/15-05/10/2018 admitted with worsening vomiting and diarrhea in the context of inflammatory colitis with hematochezia, ulcerative colitis, duodenal ulcer, esophageal candidiasis. Colonoscopy 8/19 documented ulcerative colitis, IV steroids were initiated with subsequent transition to long-term oral steroid taper.  Initially the dose was 40 milligrams daily for 1 week with decrease dosage every 2 weeks.  Specifically  she was to receive 30 mg daily for 2 weeks, 20 mg daily for 2 weeks 15 mg daily for 2 weeks, 10 mg daily for 2 weeks and then 5 mg daily for 2 weeks before discontinuation.  Initially the patient improved but on 8/22 she began having bright red blood per rectum .  EGD was performed demonstrating esophageal candidiasis.  Diflucan was initiated for 2 we, to continue until 9/8. On 8/28 she began to have dark tarry stools. The ongoing GI blood loss  required 2 units of red packed cells for stabilization of hemoglobin prior to discharge. Patient developed HSV-2 keratitis during the hospitalization.  Ophthalmology initiated Valtrex 3 times daily and prescribed artificial tears.  Past medical and surgical history: Other diagnoses and history include essential hypertension, major  depression and bipolar disorder, chronic pain and hyperglycemia.  Social history:Rare alcohol;never smoked  Family history:reviewed   Review of systems:  She states that her abdominal pain is stable. She denies any active gastrointestinal bleeding. Her main concern is marked fatigue due to poor sleep. She states that she is seen by [facility] for chronic insomnia. Being in a strange place has exacerbated her insomnia and caused fatigue. She denies any visual symptoms. She categorically states she is not going to take the Valtrex 3 times a day as was recommended by ophthalmology. She states she's had recurrent infections as well as gynecologic infections. She wants to be on 500 mg of Valtrex daily as suppression. I stressed that this is what the ophthalmologist recommended as optimal protection for her vision. Despite this she states she will not take a high-dose Valtrex.  Constitutional: No fever  Eyes: No new redness, discharge, pain, vision change ENT/mouth: No nasal congestion, purulent discharge, earache, change in hearing, sore throat  Cardiovascular: No chest pain, palpitations, paroxysmal nocturnal dyspnea, claudication, edema  Respiratory: No cough, sputum production, hemoptysis, DOE, significant snoring, apnea  Gastrointestinal: No heartburn, dysphagia,  nausea /vomiting, rectal bleeding, melena, change in bowels Genitourinary: No dysuria, hematuria, pyuria, incontinence, nocturia Musculoskeletal: No joint stiffness, joint swelling, weakness, pain Dermatologic: No rash, pruritus, change in appearance of skin Neurologic: No dizziness, headache, syncope, seizures, numbness, tingling Endocrine: No change in hair/skin/nails, excessive thirst, excessive hunger, excessive urination  Hematologic/lymphatic: No significant bruising, lymphadenopathy, abnormal bleeding Allergy/immunology: No itchy/watery eyes, significant sneezing, urticaria, angioedema  Physical exam:  Pertinent or  positive findings:she does appear fatigued. There is anisocoria; right pupil is small and almost pinpoint. She exhibits a resting tachycardia.  She is exhibiting tachypnea but denies shortness of breath. She states that she is simply nervous.She has minor isolated DIP arthritic changes. She is symmetrically and diffusely weak to opposition.Deep tendon reflexes are 1.5+ in UE.  General appearance: no acute distress, increased work of breathing is present.   Lymphatic: No lymphadenopathy about the head, neck, axilla. Eyes: No conjunctival inflammation or lid edema is present. There is no scleral icterus. Ears:  External ear exam shows no significant lesions or deformities.   Nose:  External nasal examination shows no deformity or inflammation. Nasal mucosa are pink and moist without lesions, exudates Oral exam: Lips and gums are healthy appearing.There is no oropharyngeal erythema or exudate. Neck:  No thyromegaly, masses, tenderness noted.    Heart:  regular rhythm. S1 and S2 normal without gallop, murmur, click, rub.  Lungs: without wheezes, rhonchi, rales, rubs. Abdomen: Bowel sounds are normal.  Abdomen is soft and nontender with no organomegaly, hernias, masses. GU: Deferred  Extremities:  No cyanosis, clubbing, edema. Neurologic exam:  Balance, Rhomberg, finger to nose testing could not be completed due to clinical state Skin: Warm & dry w/o tenting. No significant lesions or rash.  See clinical summary under each active problem in the Problem List with associated updated therapeutic plan

## 2018-05-12 ENCOUNTER — Encounter: Payer: Self-pay | Admitting: Internal Medicine

## 2018-05-12 NOTE — Assessment & Plan Note (Signed)
Psych NP F/U @ SNF 

## 2018-05-13 ENCOUNTER — Encounter: Payer: Self-pay | Admitting: Internal Medicine

## 2018-05-13 DIAGNOSIS — R4189 Other symptoms and signs involving cognitive functions and awareness: Secondary | ICD-10-CM

## 2018-05-13 DIAGNOSIS — R29818 Other symptoms and signs involving the nervous system: Secondary | ICD-10-CM | POA: Insufficient documentation

## 2018-06-02 ENCOUNTER — Encounter: Payer: Self-pay | Admitting: Adult Health

## 2018-06-02 ENCOUNTER — Non-Acute Institutional Stay (SKILLED_NURSING_FACILITY): Payer: Medicare Other | Admitting: Adult Health

## 2018-06-02 DIAGNOSIS — I10 Essential (primary) hypertension: Secondary | ICD-10-CM | POA: Diagnosis not present

## 2018-06-02 DIAGNOSIS — B0052 Herpesviral keratitis: Secondary | ICD-10-CM

## 2018-06-02 DIAGNOSIS — G4700 Insomnia, unspecified: Secondary | ICD-10-CM

## 2018-06-02 DIAGNOSIS — K5909 Other constipation: Secondary | ICD-10-CM

## 2018-06-02 DIAGNOSIS — K51811 Other ulcerative colitis with rectal bleeding: Secondary | ICD-10-CM | POA: Diagnosis not present

## 2018-06-02 DIAGNOSIS — F332 Major depressive disorder, recurrent severe without psychotic features: Secondary | ICD-10-CM

## 2018-06-02 DIAGNOSIS — D62 Acute posthemorrhagic anemia: Secondary | ICD-10-CM

## 2018-06-02 NOTE — Progress Notes (Signed)
Location:  [facility] Room Number: 308-A Place of Service:  SNF (31) Provider:  Kenard Gower, NP  Patient Care Team: Joana Reamer, DO as PCP - General Nelson Chimes, MD (Ophthalmology) Nita Sells, MD (Dermatology) [place] Callas, DDS (Dentistry) Polous, Misty Stanley (Nurse Practitioner)  Extended Emergency Contact Information Primary Emergency Contact: Craven,[name] Address: [address]          [place],  [place] Home Phone: [phone] Relation: Other  Code Status:  DNR  Goals of care: Advanced Directive information Advanced Directives 06/02/2018  Does Patient Have a Medical Advance Directive? -  Type of Advance Directive Out of facility DNR (pink MOST or yellow form)  Does patient want to make changes to medical advance directive? No - Patient declined  Copy of Healthcare Power of Attorney in Chart? No - copy requested  Would patient like information on creating a medical advance directive? No - Patient declined     Chief Complaint  Patient presents with  . Discharge Note    The patient is seen for a discharge visit, is scheduled to discharge from SNF on 06/03/18.    HPI:  Pt is a 77 y.o. female seen today for discharge.  She is discharging to [facility] with home health OT and PT to evaluate and treat .    She has been admitted to [facility] on 05/10/18 from the hospital.  She was seen by GI who performed a colonoscopy on 8/19 and was diagnosed with ulcerative colitis.  She was started on IV steroids and transitioned to long oral steroid taper.  She has improved but on 8/22 started having bright red blood per rectum.  An EGD was performed which found esophageal candidiasis.  She was started on Diflucan for 2 weeks.  She then had dark tarry stools that started on 8/28 and was started on Linzess and MiraLAX twice daily.  She was admitted to the hospital due to GI blood loss and required  2 units packed RBC transfusion.  She developed HSV 2 keratitis while in the hospital.  Ophthalmology was consulted and she was started on Valtrex 3 times daily and artificial tears. She has a PMH of essential hypertension, major depression, bipolar disorder, chronic pain, and hyperglycemia.    Patient was admitted to this facility for short-term rehabilitation after the patient's recent hospitalization.  Patient has completed SNF rehabilitation and therapy has cleared the patient for discharge.   Past Medical History:  Diagnosis Date  . Allergy   . Anxiety   . Bipolar 1 disorder (HCC)   . Depression   . Diabetes mellitus   . Diarrhea 04/2018  . Genital herpes in women 1983  . 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 Hyperlipidemia   . Ingrown toenail 08/16/2015  . Intentional drug overdose (HCC)   . Irritable bowel syndrome (IBS)   . Loss of appetite 01/30/2015  . Overdose of benzodiazepine 06/2005   Xanax  . Sinus disorder    Past Surgical History:  Procedure Laterality Date  . BIOPSY  04/26/2018   Procedure: BIOPSY;  Surgeon: Jeani Hawking, MD;  Location: [facility] ENDOSCOPY;  Service: Endoscopy;;  . BIOPSY  05/06/2018   Procedure: BIOPSY;  Surgeon: Jeani Hawking, MD;  Location[facility] ENDOSCOPY;  Service: Endoscopy;;  . CATARACT EXTRACTION    . Cataract Surgery     L eye 12/2008, R eye 05/2009 [facility]  . COLONOSCOPY WITH PROPOFOL  N/A 04/26/2018   Procedure: COLONOSCOPY WITH PROPOFOL;  Surgeon: Jeani Hawking, MD;  Location: [facility] ENDOSCOPY;  Service: Endoscopy;  Laterality: N/A;  . ESOPHAGOGASTRODUODENOSCOPY N/A 05/06/2018   Procedure: ESOPHAGOGASTRODUODENOSCOPY (EGD);  Surgeon: Jeani Hawking, MD;  Location: [facility] ENDOSCOPY;  Service: Endoscopy;  Laterality: N/A;  . EYE SURGERY    . TUBAL LIGATION      Allergies  Allergen Reactions  . Latex Hives    Outpatient Encounter Medications as of 06/02/2018  Medication Sig  . calcium carbonate  (TUMS - DOSED IN MG ELEMENTAL CALCIUM) 500 MG chewable tablet Chew 1 tablet (200 mg of elemental calcium total) by mouth 2 (two) times daily.  . folic acid (FOLVITE) 1 MG tablet Take 1 tablet (1 mg total) by mouth daily.  . hydrochlorothiazide (HYDRODIURIL) 25 MG tablet Take 1 tablet (25 mg total) by mouth daily.  . hydroxypropyl methylcellulose / hypromellose (ISOPTO TEARS / GONIOVISC) 2.5 % ophthalmic solution Place 1 drop into both eyes 4 (four) times daily as needed for dry eyes.  Marland Kitchen linaclotide (LINZESS) 145 MCG CAPS capsule Take 1 capsule (145 mcg total) by mouth daily before breakfast.  . lisinopril (PRINIVIL,ZESTRIL) 10 MG tablet Take 1 tablet (10 mg total) by mouth daily.  . Melatonin 5 MG TABS Take 1 tablet by mouth at bedtime.  . mirtazapine (REMERON) 30 MG tablet Take 1 tablet (30 mg total) by mouth at bedtime.  . Multiple Vitamin (MULTIVITAMIN WITH MINERALS) TABS tablet Take 1 tablet by mouth daily.  Marland Kitchen NUTRITIONAL SUPPLEMENT LIQD Take 120 mLs by mouth 2 (two) times daily.  . ondansetron (ZOFRAN-ODT) 4 MG disintegrating tablet Take 4 mg by mouth every 8 (eight) hours as needed for nausea or vomiting.  . polyethylene glycol (MIRALAX / GLYCOLAX) packet Take 17 g by mouth 2 (two) times daily.  Melene Muller ON 06/23/2018] predniSONE (DELTASONE) 10 MG tablet Take 1 tablet (10 mg total) by mouth daily with breakfast for 14 days.  . predniSONE (DELTASONE) 20 MG tablet Take 20 mg by mouth daily with breakfast. Take 1 tablet 05/26/18-06/08/18  . [START ON 06/09/2018] predniSONE (DELTASONE) 5 MG tablet Take 3 tablets (15 mg total) by mouth daily with breakfast for 14 days.  Melene Muller ON 07/07/2018] predniSONE (DELTASONE) 5 MG tablet Take 1 tablet (5 mg total) by mouth daily with breakfast for 14 days.  Marland Kitchen thiamine 100 MG tablet Take 1 tablet (100 mg total) by mouth daily.  . valACYclovir (VALTREX) 500 MG tablet Take 500 mg by mouth daily.  . [DISCONTINUED] feeding supplement, ENSURE ENLIVE, (ENSURE ENLIVE)  LIQD Take 237 mLs by mouth 3 (three) times daily between meals.  . [DISCONTINUED] predniSONE (DELTASONE) 20 MG tablet Take 1 tablet (20 mg total) by mouth daily with breakfast for 14 days.  . [DISCONTINUED] predniSONE (DELTASONE) 20 MG tablet Take 2 tablets (40 mg total) by mouth daily with breakfast.   No facility-administered encounter medications on file as of 06/02/2018.     Review of Systems  GENERAL: No change in appetite, no fatigue, no weight changes, no fever, chills or weakness MOUTH and THROAT: Denies oral discomfort, gingival pain or bleeding RESPIRATORY: no cough, SOB, DOE, wheezing, hemoptysis CARDIAC: No chest pain, edema or palpitations GI: No abdominal pain, diarrhea, constipation, heart burn, nausea or vomiting GU: Denies dysuria, frequency, hematuria, incontinence, or discharge PSYCHIATRIC: Denies feelings of depression or anxiety. No report of hallucinations, insomnia, paranoia, or agitation   Immunization History  Administered Date(s) Administered  . PPD Test 02/23/2013  . Td  02/07/2004   Pertinent  Health Maintenance Due  Topic Date Due  . FOOT EXAM  12/26/1950  . OPHTHALMOLOGY EXAM  12/26/1950  . DEXA SCAN  12/26/2006  . PNA vac Low Risk Adult (1 of 2 - PCV13) 12/26/2006  . MAMMOGRAM  04/07/2014  . INFLUENZA VACCINE  04/08/2018  . HEMOGLOBIN A1C  10/29/2018   Fall Risk  04/15/2018 09/05/2016 08/06/2015 10/16/2014 05/31/2014  Falls in the past year? No No Yes No No  Number falls in past yr: - - 1 - -  Injury with Fall? - - Yes - -      Vitals:   06/02/18 1011  BP: 128/72  Pulse: 75  Resp: 17  Temp: (!) 97.2 F (36.2 C)  TempSrc: Oral  SpO2: 95%  Weight: 132 lb 6.4 oz (60.1 kg)  Height: 5\' 2"  (1.575 m)   Body mass index is 24.22 kg/m.  Physical Exam  GENERAL APPEARANCE: Well nourished. In no acute distress. Normal body habitus SKIN:  Skin is warm and dry.  MOUTH and THROAT: Lips are without lesions. Oral mucosa is moist and without lesions.  Tongue is normal in shape, size, and color and without lesions RESPIRATORY: Breathing is even & unlabored, BS CTAB CARDIAC: RRR, no murmur,no extra heart sounds, no edema GI: Abdomen soft, normal BS, no masses, no tenderness EXTREMITIES:  Able to move X 4 extremities NEUROLOGICAL: There is no tremor. Speech is clear PSYCHIATRIC: Alert and oriented X 3. Affect and behavior are appropriate   Labs reviewed: Recent Labs    05/04/18 0501 05/05/18 0342 05/06/18 0504  NA 136 134* 134*  K 4.6 4.3 4.6  CL 99 100 98  CO2 30 26 27   GLUCOSE 141* 157* 186*  BUN 20 22 21   CREATININE 0.78 0.77 0.74  CALCIUM 7.8* 7.9* 7.9*   Recent Labs    05/01/18 0905 05/03/18 0557 05/06/18 0504  AST 22 31 23   ALT 38 46* 54*  ALKPHOS 57 56 57  BILITOT 0.6 0.4 0.3  PROT 5.0* 5.1* 5.2*  ALBUMIN 1.8* 1.9* 2.0*   Recent Labs    08/19/17 2201  05/08/18 0512 05/09/18 0358 05/10/18 1005  WBC 9.1   < > 6.6 6.4 6.1  NEUTROABS 5.1  --   --   --   --   HGB 13.9   < > 11.0* 10.8* 10.7*  HCT 41.0   < > 33.8* 33.2* 34.0*  MCV 95.8   < > 94.4 94.3 96.3  PLT 343   < > 464* 444* 407*   < > = values in this interval not displayed.   Lab Results  Component Value Date   TSH 2.850 10/16/2014   Lab Results  Component Value Date   HGBA1C 6.5 (H) 04/28/2018   Lab Results  Component Value Date   CHOL 245 (H) 06/21/2008   HDL 41 06/21/2008   LDLCALC 144 (H) 06/21/2008   LDLDIRECT 133 (H) 10/25/2008   TRIG 301 (H) 06/21/2008   CHOLHDL 6.0 Ratio 06/21/2008    Assessment/Plan  1. Other ulcerative colitis with rectal bleeding (HCC) - continue prednisone taper, will follow up with GI, Dr. Elnoria Howard upon disscahrge   2. HSV (herpes simplex virus) dendritic keratitis - valACYclovir (VALTREX) 500 MG tablet; Take 500 mg by mouth daily, [facility] with [facility] upon discharge  3. Essential hypertension, benign -well controlled, continue hydrochlorothiazide 25 mg 1 tab daily and lisinopril 10 mg 1 tab  daily   4. Chronic constipation -continue Linzess 145  mcg 1 capsule daily   5. Anemia due to acute blood loss -  required 2 units packed RBC transfusion in the hospital Lab Results  Component Value Date   HGB 10.7 (L) 05/10/2018     6. MDD (major depressive disorder), recurrent severe, without psychosis (HCC) - mood is stable, continue Remeron 30 mg 1 tab nightly   7. Insomnia, unspecified type - Melatonin 5 MG TABS; Take 1 tablet by mouth at bedtime.    I have filled out patient's discharge paperwork and written prescriptions.  Patient will receive home health PT and OT.  DME provided: None  Total discharge time: Less than 30 minutes  Discharge time involved coordination of the discharge process with social worker, nursing staff and therapy department. Medical justification for home health services verified.    Kenard Gower, NP [facility] [phone] (Monday-Friday 8:00 a.m. - 5:00 p.m.) [phone] (after hours)

## 2018-10-09 DEATH — deceased

## 2019-07-07 IMAGING — CT CT ABD-PELV W/ CM
2 of 5 series · 16 of 46 positions shown, 18 images · IV contrast (Omni 300)
Comparison: None.

CLINICAL DATA: Lower abdominal pain and bloody diarrhea for 1 week

EXAM:
CT ABDOMEN AND PELVIS WITH CONTRAST
TECHNIQUE: Multidetector CT imaging of the abdomen and pelvis was performed
using the standard protocol following bolus administration of
intravenous contrast.
CONTRAST:  80mL OMNIPAQUE IOHEXOL 300 MG/ML  SOLN

[Series 3: a/p w/ 5mm · axial · 0.61mm/px · z∈[-656,-271]mm · 13 of 87 slices shown, 15 images]
[im 5/87  soft-tissue]
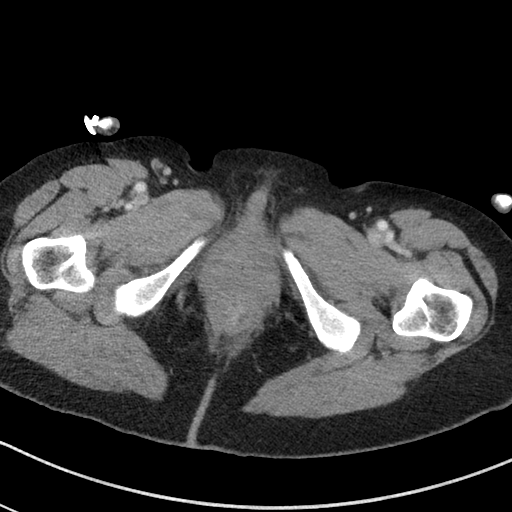
[im 5/87  bone]
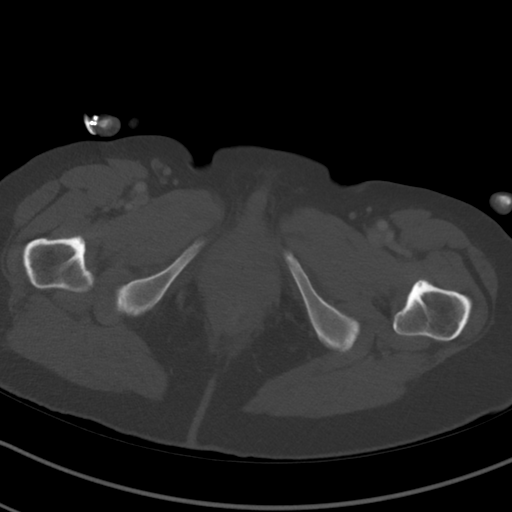
[im 14/87  soft-tissue]
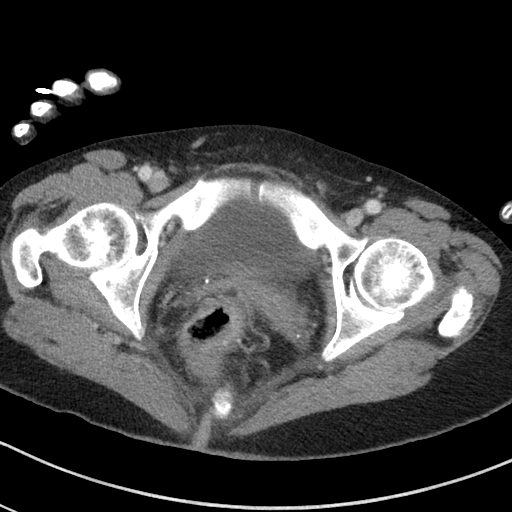
[im 19/87  soft-tissue]
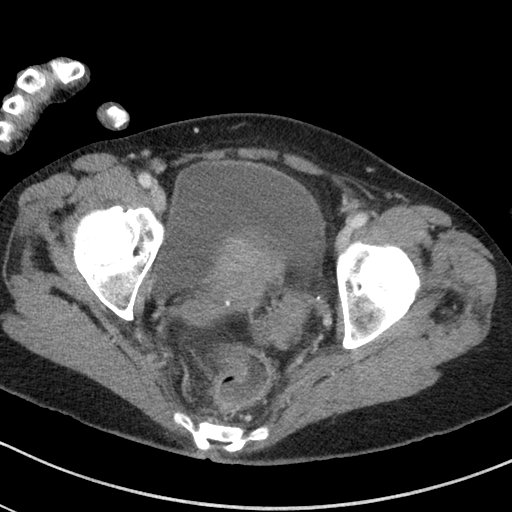
[im 23/87  soft-tissue]
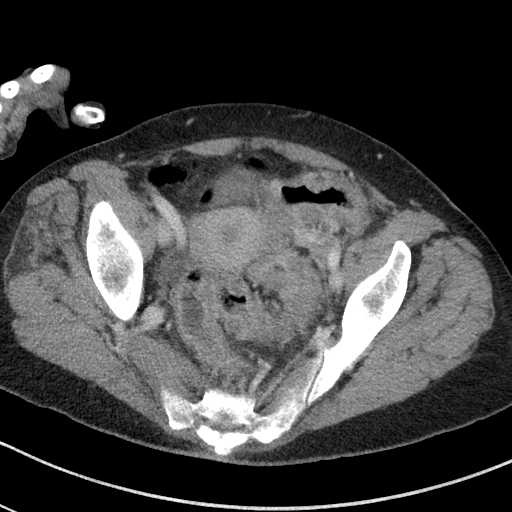
[im 32/87  soft-tissue]
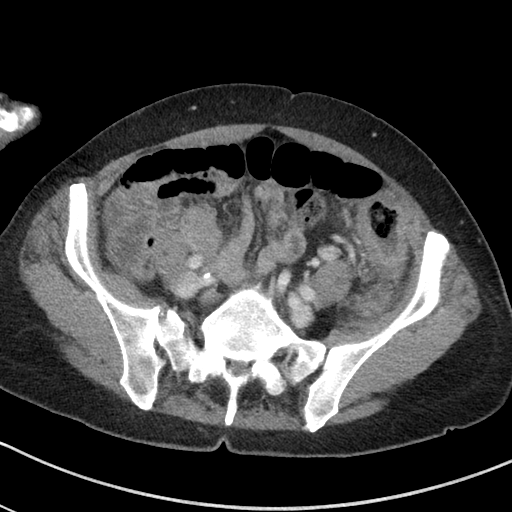
[im 37/87  soft-tissue]
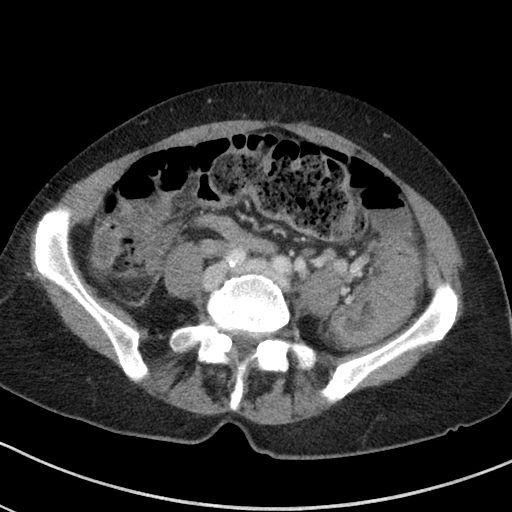
[im 46/87  soft-tissue]
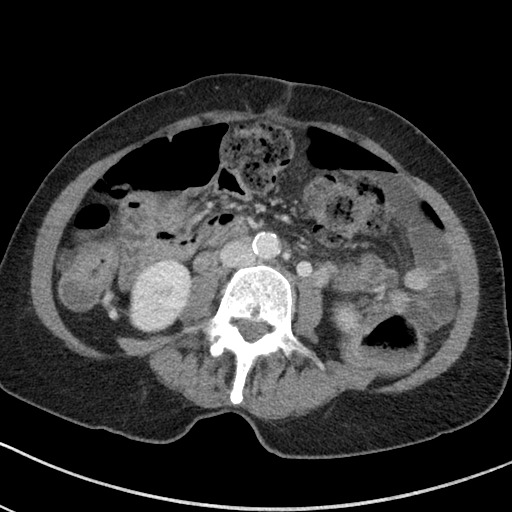
[im 50/87  soft-tissue]
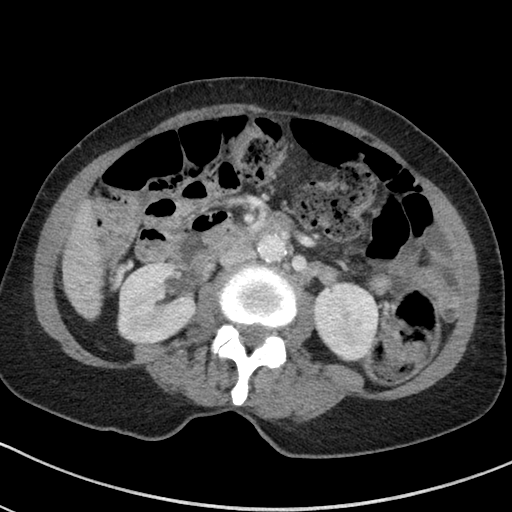
[im 55/87  soft-tissue]
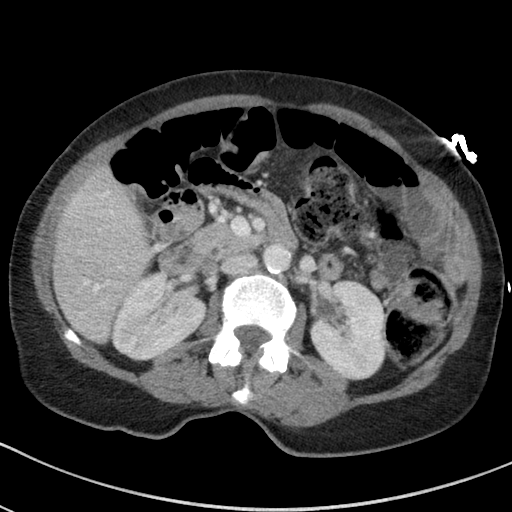
[im 55/87  bone]
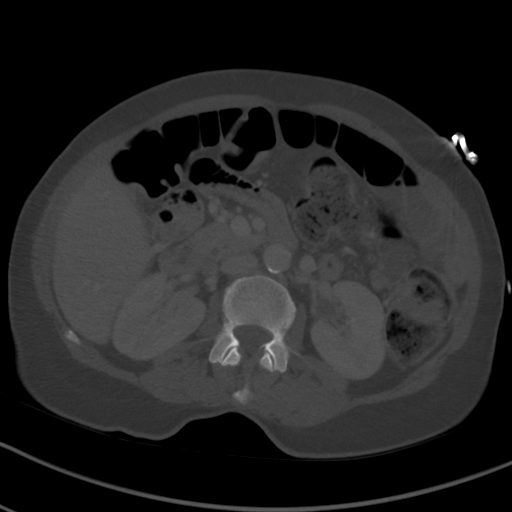
[im 64/87  soft-tissue]
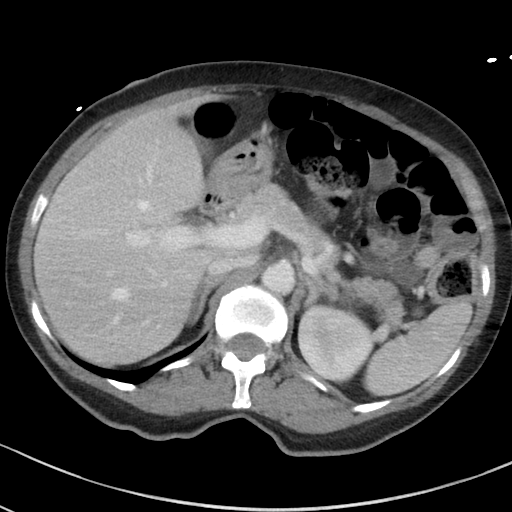
[im 68/87  soft-tissue]
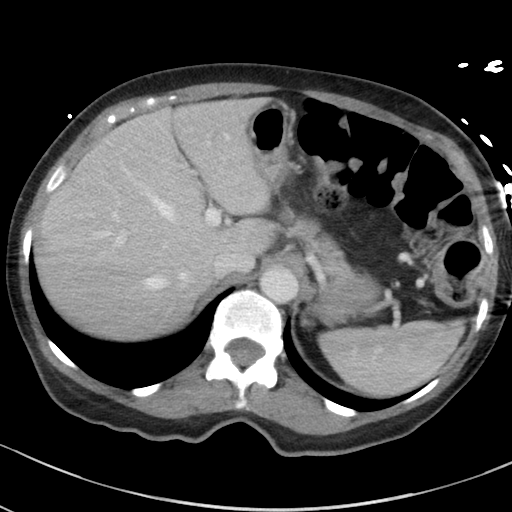
[im 73/87  soft-tissue]
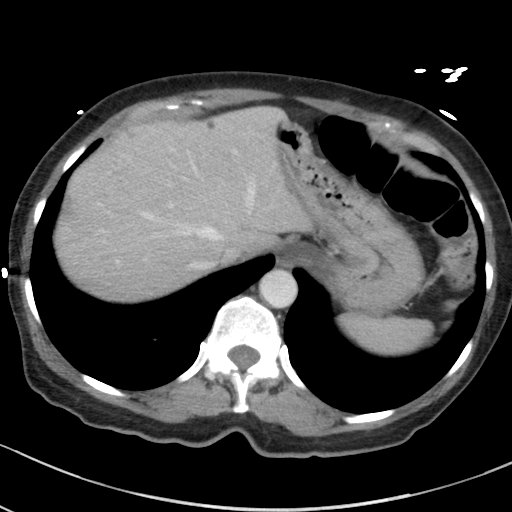
[im 82/87  soft-tissue]
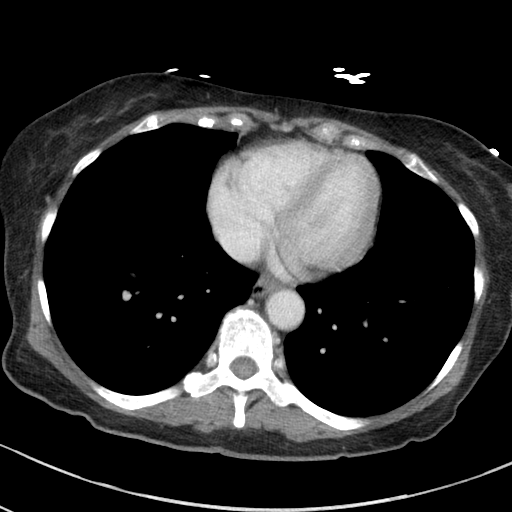

[Series 6: a/p w/ cor · coronal · 0.66mm/px · 3 of 113 slices shown]
[im 38/113  soft-tissue]
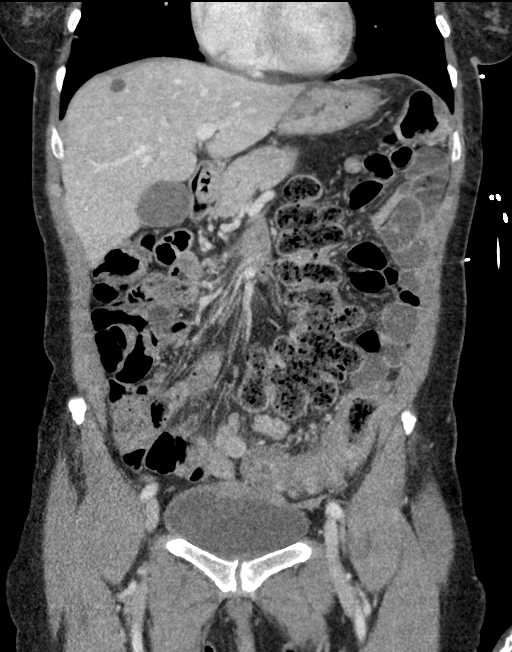
[im 50/113  soft-tissue]
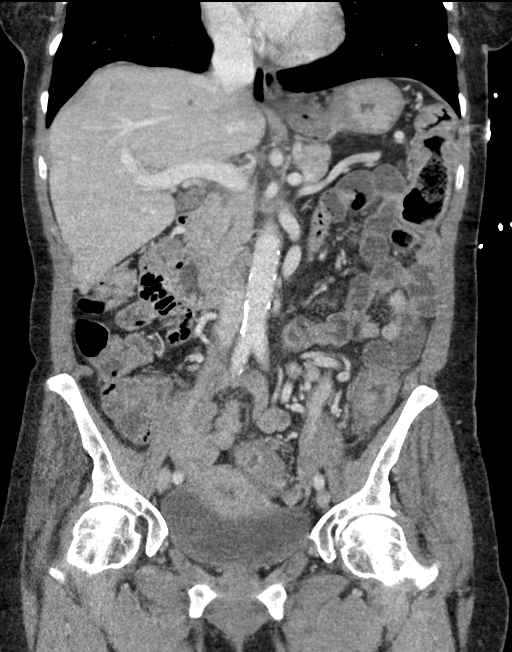
[im 63/113  soft-tissue]
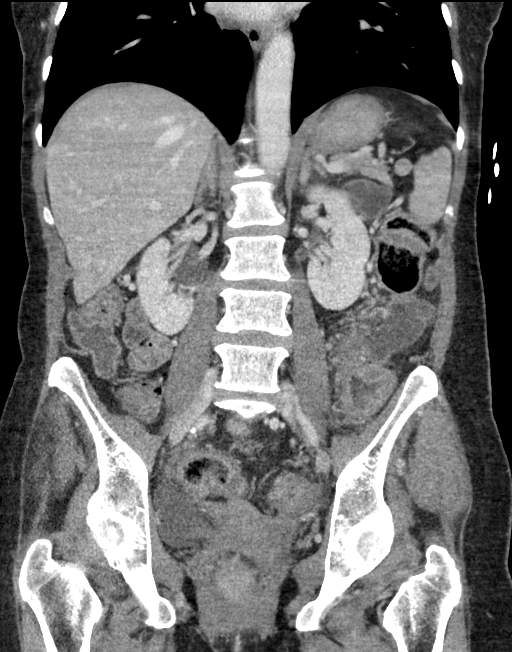

[16 of 46 positions shown; findings below may reference images not displayed]

FINDINGS: Lower chest: No acute abnormality.

Hepatobiliary: Scattered cystic lesions are again identified
throughout the liver. No focal mass is noted. The gallbladder is
within normal limits.

Pancreas: Unremarkable. No pancreatic ductal dilatation or
surrounding inflammatory changes.

Spleen: Normal in size without focal abnormality.

Adrenals/Urinary Tract: The adrenal glands are unremarkable. No
renal calculi or obstructive changes are seen. The bladder is
partially distended.

Stomach/Bowel: There is diffuse wall thickening of the descending
and sigmoid colon. This is likely inflammatory in nature and would
correspond with the patient's given clinical history of bloody
diarrhea. The appendix is not well visualized although no
inflammatory changes are seen. No obstructive changes are noted.

Vascular/Lymphatic: Aortic atherosclerosis. No enlarged abdominal or
pelvic lymph nodes.

Reproductive: Uterus and bilateral adnexa are unremarkable.

Other: No abdominal wall hernia or abnormality. No abdominopelvic
ascites.

Musculoskeletal: No acute bony abnormality is noted.
IMPRESSION: Diffuse wall thickening inflammatory change in the descending and
sigmoid colons most consistent with an inflammatory colitis. No
perforation is noted.

No other acute abnormality is noted.

## 2019-07-14 IMAGING — DX DG ABDOMEN 1V
1 series · 1 of 1 positions shown · non-contrast
Comparison: CT abdomen pelvis dated April 23, 2018.

CLINICAL DATA: Upper abdominal pain and distention for the past 2
weeks. Recent colonoscopy 4 days ago.

EXAM:
ABDOMEN - 1 VIEW

[abdomen kub]
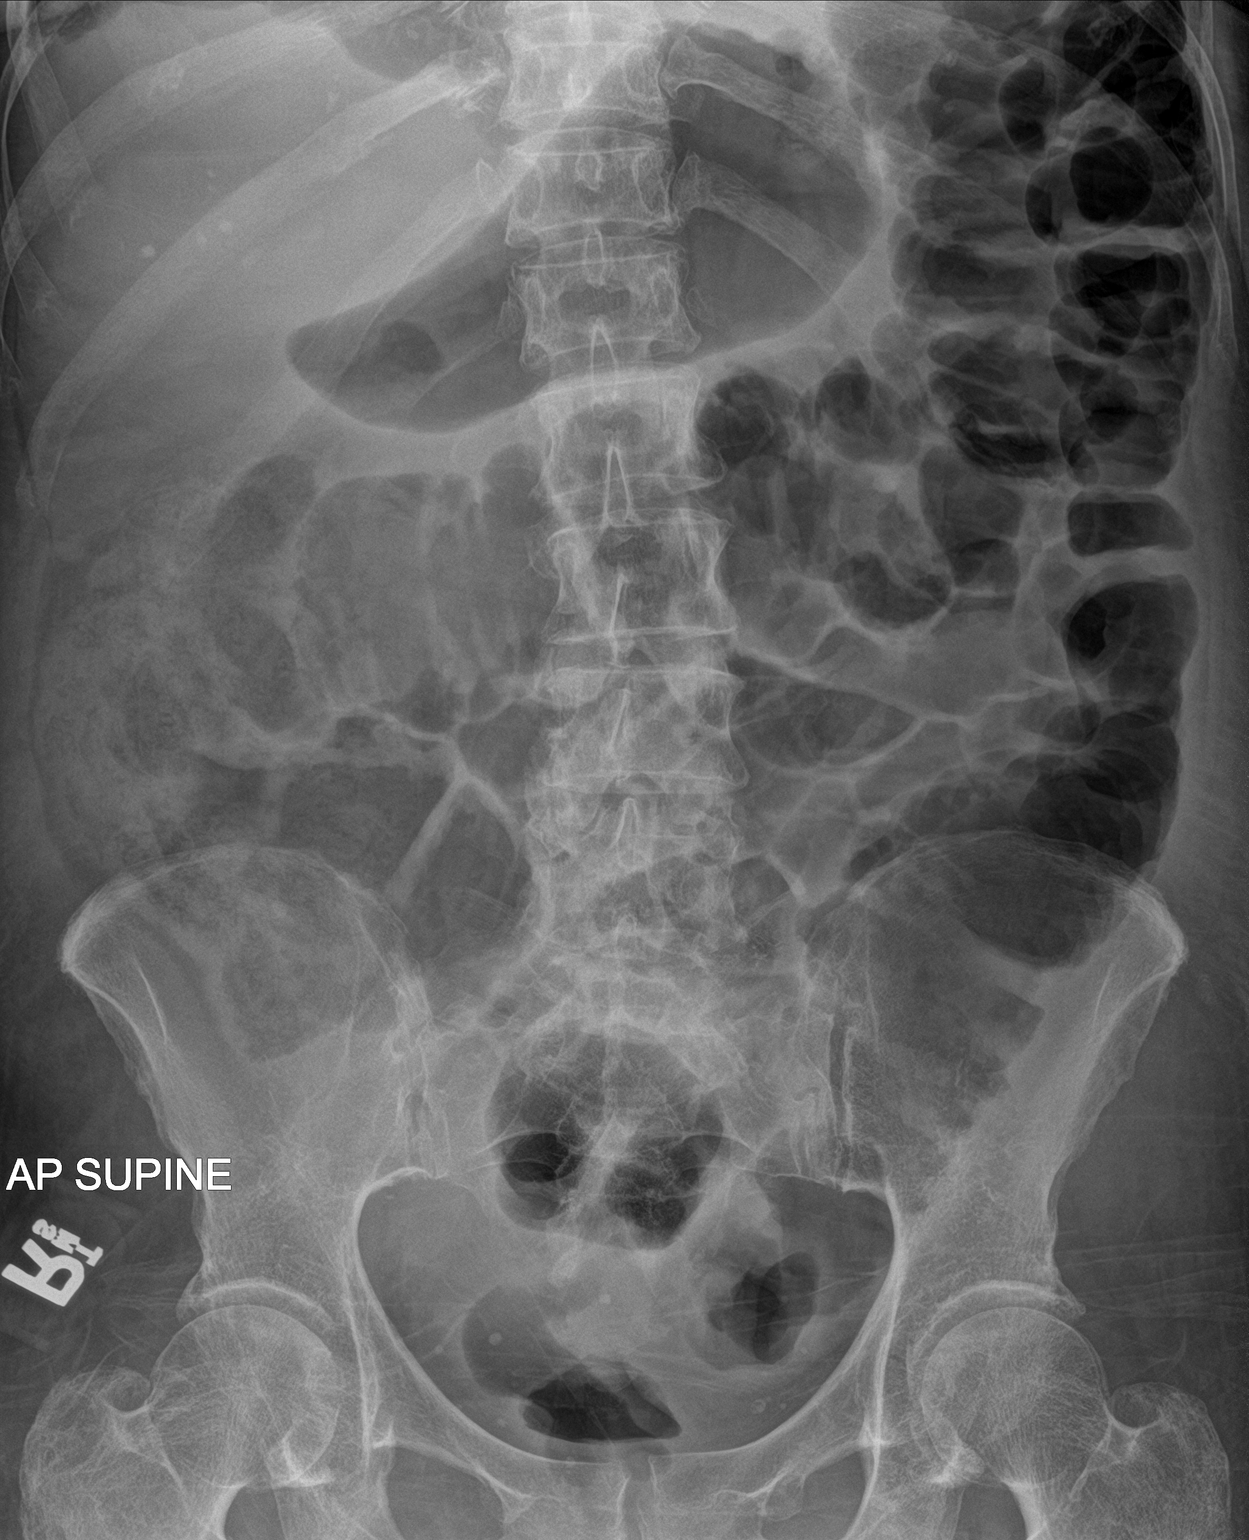

[1 of 1 positions shown; findings below may reference images not displayed]

FINDINGS: Gas throughout a nondistended colon. No dilated loops of small
bowel. No definite pneumoperitoneum, although evaluation is limited
on the single supine view. No acute osseous abnormality. Mild
degenerative changes of the lower lumbar spine.
IMPRESSION: 1. No obstruction or ileus.
